# Patient Record
Sex: Male | Born: 1935 | Race: White | Hispanic: No | Marital: Married | Smoking: Former smoker
Health system: Southern US, Community
[De-identification: ages and names within clinical notes are randomized; demographics above are authoritative.]

## PROBLEM LIST (undated history)

## (undated) DIAGNOSIS — I10 Essential (primary) hypertension: Secondary | ICD-10-CM

## (undated) DIAGNOSIS — I2510 Atherosclerotic heart disease of native coronary artery without angina pectoris: Secondary | ICD-10-CM

## (undated) DIAGNOSIS — I509 Heart failure, unspecified: Secondary | ICD-10-CM

## (undated) DIAGNOSIS — I499 Cardiac arrhythmia, unspecified: Secondary | ICD-10-CM

## (undated) DIAGNOSIS — I4891 Unspecified atrial fibrillation: Secondary | ICD-10-CM

## (undated) DIAGNOSIS — M150 Primary generalized (osteo)arthritis: Secondary | ICD-10-CM

## (undated) DIAGNOSIS — E785 Hyperlipidemia, unspecified: Secondary | ICD-10-CM

## (undated) HISTORY — PX: CARDIAC SURGERY: SHX584

## (undated) HISTORY — DX: Hyperlipidemia, unspecified: E78.5

## (undated) HISTORY — DX: Atherosclerotic heart disease of native coronary artery without angina pectoris: I25.10

## (undated) HISTORY — DX: Cardiac arrhythmia, unspecified: I49.9

---

## 1996-08-17 HISTORY — PX: CORONARY ARTERY BYPASS GRAFT: SHX141

## 2020-03-02 ENCOUNTER — Encounter (HOSPITAL_COMMUNITY): Payer: Self-pay | Admitting: Emergency Medicine

## 2020-03-02 ENCOUNTER — Other Ambulatory Visit: Payer: Self-pay

## 2020-03-02 ENCOUNTER — Inpatient Hospital Stay (HOSPITAL_COMMUNITY)
Admission: EM | Admit: 2020-03-02 | Discharge: 2020-03-06 | DRG: 871 | Disposition: A | Discharge status: Home / Self Care | Payer: Medicare Other | Attending: Internal Medicine | Admitting: Internal Medicine

## 2020-03-02 ENCOUNTER — Emergency Department (HOSPITAL_COMMUNITY): Payer: Medicare Other

## 2020-03-02 DIAGNOSIS — Z888 Allergy status to other drugs, medicaments and biological substances status: Secondary | ICD-10-CM

## 2020-03-02 DIAGNOSIS — Z87891 Personal history of nicotine dependence: Secondary | ICD-10-CM | POA: Diagnosis not present

## 2020-03-02 DIAGNOSIS — I110 Hypertensive heart disease with heart failure: Secondary | ICD-10-CM | POA: Diagnosis present

## 2020-03-02 DIAGNOSIS — Z91030 Bee allergy status: Secondary | ICD-10-CM

## 2020-03-02 DIAGNOSIS — K3580 Unspecified acute appendicitis: Secondary | ICD-10-CM | POA: Diagnosis not present

## 2020-03-02 DIAGNOSIS — Z20822 Contact with and (suspected) exposure to covid-19: Secondary | ICD-10-CM | POA: Diagnosis present

## 2020-03-02 DIAGNOSIS — K352 Acute appendicitis with generalized peritonitis, without abscess: Secondary | ICD-10-CM

## 2020-03-02 DIAGNOSIS — E278 Other specified disorders of adrenal gland: Secondary | ICD-10-CM | POA: Diagnosis present

## 2020-03-02 DIAGNOSIS — Z79899 Other long term (current) drug therapy: Secondary | ICD-10-CM | POA: Diagnosis not present

## 2020-03-02 DIAGNOSIS — Z7901 Long term (current) use of anticoagulants: Secondary | ICD-10-CM | POA: Diagnosis not present

## 2020-03-02 DIAGNOSIS — I4891 Unspecified atrial fibrillation: Secondary | ICD-10-CM | POA: Diagnosis present

## 2020-03-02 DIAGNOSIS — I10 Essential (primary) hypertension: Secondary | ICD-10-CM | POA: Diagnosis not present

## 2020-03-02 DIAGNOSIS — I4510 Unspecified right bundle-branch block: Secondary | ICD-10-CM | POA: Diagnosis present

## 2020-03-02 DIAGNOSIS — K3532 Acute appendicitis with perforation and localized peritonitis, without abscess: Secondary | ICD-10-CM | POA: Diagnosis present

## 2020-03-02 DIAGNOSIS — E876 Hypokalemia: Secondary | ICD-10-CM | POA: Diagnosis present

## 2020-03-02 DIAGNOSIS — Z7982 Long term (current) use of aspirin: Secondary | ICD-10-CM

## 2020-03-02 DIAGNOSIS — I482 Chronic atrial fibrillation, unspecified: Secondary | ICD-10-CM | POA: Diagnosis present

## 2020-03-02 DIAGNOSIS — Z882 Allergy status to sulfonamides status: Secondary | ICD-10-CM | POA: Diagnosis not present

## 2020-03-02 DIAGNOSIS — E871 Hypo-osmolality and hyponatremia: Secondary | ICD-10-CM | POA: Diagnosis present

## 2020-03-02 DIAGNOSIS — Z7289 Other problems related to lifestyle: Secondary | ICD-10-CM | POA: Diagnosis not present

## 2020-03-02 DIAGNOSIS — A419 Sepsis, unspecified organism: Secondary | ICD-10-CM | POA: Diagnosis present

## 2020-03-02 DIAGNOSIS — I5032 Chronic diastolic (congestive) heart failure: Secondary | ICD-10-CM | POA: Diagnosis present

## 2020-03-02 HISTORY — DX: Heart failure, unspecified: I50.9

## 2020-03-02 HISTORY — DX: Essential (primary) hypertension: I10

## 2020-03-02 HISTORY — DX: Unspecified atrial fibrillation: I48.91

## 2020-03-02 LAB — COMPREHENSIVE METABOLIC PANEL
ALT: 19 U/L (ref 0–44)
AST: 18 U/L (ref 15–41)
Albumin: 4.4 g/dL (ref 3.5–5.0)
Alkaline Phosphatase: 42 U/L (ref 38–126)
Anion gap: 11 (ref 5–15)
BUN: 13 mg/dL (ref 8–23)
CO2: 27 mmol/L (ref 22–32)
Calcium: 9.1 mg/dL (ref 8.9–10.3)
Chloride: 94 mmol/L — ABNORMAL LOW (ref 98–111)
Creatinine, Ser: 0.76 mg/dL (ref 0.61–1.24)
GFR calc Af Amer: >60 mL/min (ref >60)
GFR calc non Af Amer: >60 mL/min (ref >60)
Glucose, Bld: 152 mg/dL — ABNORMAL HIGH (ref 70–99)
Potassium: 3.5 mmol/L (ref 3.5–5.1)
Sodium: 132 mmol/L — ABNORMAL LOW (ref 135–145)
Total Bilirubin: 1.7 mg/dL — ABNORMAL HIGH (ref 0.3–1.2)
Total Protein: 7.6 g/dL (ref 6.5–8.1)

## 2020-03-02 LAB — URINALYSIS, ROUTINE W REFLEX MICROSCOPIC
Bacteria, UA: NONE SEEN
Bilirubin Urine: NEGATIVE
Glucose, UA: NEGATIVE mg/dL
Hgb urine dipstick: NEGATIVE
Ketones, ur: 5 mg/dL — AB
Leukocytes,Ua: NEGATIVE
Nitrite: NEGATIVE
Protein, ur: 30 mg/dL — AB
Specific Gravity, Urine: 1.02 (ref 1.005–1.030)
pH: 5 (ref 5.0–8.0)

## 2020-03-02 LAB — CBC
HCT: 41.5 % (ref 39.0–52.0)
Hemoglobin: 14 g/dL (ref 13.0–17.0)
MCH: 32 pg (ref 26.0–34.0)
MCHC: 33.7 g/dL (ref 30.0–36.0)
MCV: 95 fL (ref 80.0–100.0)
Platelets: 183 10*3/uL (ref 150–400)
RBC: 4.37 MIL/uL (ref 4.22–5.81)
RDW: 11.9 % (ref 11.5–15.5)
WBC: 18.3 10*3/uL — ABNORMAL HIGH (ref 4.0–10.5)
nRBC: 0 % (ref 0.0–0.2)

## 2020-03-02 LAB — SARS CORONAVIRUS 2 BY RT PCR (HOSPITAL ORDER, PERFORMED IN ~~LOC~~ HOSPITAL LAB): SARS Coronavirus 2: NEGATIVE

## 2020-03-02 LAB — LACTIC ACID, PLASMA: Lactic Acid, Venous: 1.3 mmol/L (ref 0.5–1.9)

## 2020-03-02 LAB — LIPASE, BLOOD: Lipase: 20 U/L (ref 11–51)

## 2020-03-02 MED ORDER — SODIUM CHLORIDE 0.9 % IV BOLUS
1000.0000 mL | Freq: Once | INTRAVENOUS | Status: AC
  Administered 2020-03-02: 1000 mL via INTRAVENOUS

## 2020-03-02 MED ORDER — PIPERACILLIN-TAZOBACTAM 3.375 G IVPB
3.3750 g | Freq: Three times a day (TID) | INTRAVENOUS | Status: DC
  Administered 2020-03-03 – 2020-03-06 (×10): 3.375 g via INTRAVENOUS
  Filled 2020-03-02 (×10): qty 50

## 2020-03-02 MED ORDER — PIPERACILLIN-TAZOBACTAM 3.375 G IVPB 30 MIN
3.3750 g | Freq: Once | INTRAVENOUS | Status: AC
  Administered 2020-03-02: 3.375 g via INTRAVENOUS
  Filled 2020-03-02: qty 50

## 2020-03-02 MED ORDER — SODIUM CHLORIDE 0.9% FLUSH: 3.0000 mL | Freq: Once | INTRAVENOUS | Status: AC

## 2020-03-02 MED ORDER — SODIUM CHLORIDE (PF) 0.9 % IJ SOLN
INTRAMUSCULAR | Status: AC
  Administered 2020-03-02: 3 mL via INTRAVENOUS
  Filled 2020-03-02: qty 50

## 2020-03-02 MED ORDER — IOHEXOL 300 MG/ML  SOLN
100.0000 mL | Freq: Once | INTRAMUSCULAR | Status: AC | PRN
  Administered 2020-03-02: 100 mL via INTRAVENOUS

## 2020-03-02 NOTE — Progress Notes (Signed)
Pharmacy Antibiotic Note  Donald [name] is a 86 y.o. male admitted on 03/02/2020 with Intra-abdominal infection.  Pharmacy has been consulted for zosyn dosing.  Plan: Zosyn 3.375g IV q8h (4 hour infusion).  Height: 5\' 6"  (167.6 cm) Weight: 82.6 kg (182 lb) IBW/kg (Calculated) : 63.8  Temp (24hrs), Avg:99.3 F (37.4 C), Min:99.3 F (37.4 C), Max:99.3 F (37.4 C)  Recent Labs  Lab 03/02/20 1929 03/02/20 2102  WBC 18.3*  --   CREATININE 0.76  --   LATICACIDVEN  --  1.3    Estimated Creatinine Clearance: 68.1 mL/min (by C-G formula based on SCr of 0.76 mg/dL).    Allergies  Allergen Reactions  . Bee Venom Other (See Comments)    Other reaction(s): fainting, decreased BP  . Chromium Other (See Comments)    rash  . Gabapentin Other (See Comments)    Can't take capsules, tablets are tolerated  . Sulfa Antibiotics Rash    Antimicrobials this admission: Zosyn 03/02/2020 >>   Dose adjustments this admission: -  Microbiology results: -  Thank you for allowing pharmacy to be a part of this patient's care.  03/04/2020 [place] 03/02/2020 11:51 PM

## 2020-03-02 NOTE — Consult Note (Signed)
Reason for Consult:appendicitis Referring Physician: Dalene Seltzer MD   Sequoyah Counterman is an 86 y.o. male.  HPI: Asked to see pt for abdominal pain.  Started yesterday and was located around his belt line.  Crampy and focused around his umbilicus.  Pain worsened and he sought care in the ED.  CT showed perforation without abscess.  There is a phlegmon. Pt has low grade fever. He is comfortable currently.   Past Medical History:  Diagnosis Date   A-fib [facility])    CHF (congestive heart failure) (HCC)    Hypertension     Past Surgical History:  Procedure Laterality Date   CARDIAC SURGERY      No family history on file.  Social History:  reports that he has quit smoking. He has never used smokeless tobacco. He reports current alcohol use. He reports that he does not use drugs.  Allergies:  Allergies  Allergen Reactions   Bee Venom Other (See Comments)    Other reaction(s): fainting, decreased BP   Chromium Other (See Comments)    rash   Gabapentin Other (See Comments)    Can't take capsules, tablets are tolerated   Sulfa Antibiotics Rash    Medications: I have reviewed the patient's current medications.    CT ABDOMEN PELVIS W CONTRAST  Result Date: 03/02/2020 CLINICAL DATA:  Abdominal pain, fever EXAM: CT ABDOMEN AND PELVIS WITH CONTRAST TECHNIQUE: Multidetector CT imaging of the abdomen and pelvis was performed using the standard protocol following bolus administration of intravenous contrast. CONTRAST:  OMNIPAQUE IOHEXOL 300 MG/ML  SOLN COMPARISON:  None FINDINGS: Lower chest: Lung bases are clear. Cardiac size at the upper limits of normal. No pericardial effusion. Hepatobiliary: Hypoattenuating focus in the caudate lobe measuring 1.2 cm, some possible centripetal fill in on delayed phase imaging. Few additional scattered subcentimeter hypertension foci too small to fully characterize on CT imaging but statistically likely benign. Smooth liver surface contour. Hepatic  attenuation within expected normals for phase of imaging. Normal gallbladder and biliary tree without visible calcified gallstones. Pancreas: Unremarkable. No pancreatic ductal dilatation or surrounding inflammatory changes. Spleen: Normal in size without focal abnormality. Adrenals/Urinary Tract: There is a 2.2 cm fat attenuation left adrenal nodule arising from the medial limb (2/18) and a more indeterminate 2.6 cm intermediate attenuation lesion arising from the lateral limb/body without significant washout on postcontrast imaging (2/21). Kidneys enhance and excrete symmetrically. Fluid attenuation cyst measuring 1.7 cm in the interpolar left kidney. Additional subcentimeter hypertension foci too small to fully characterize on CT imaging but statistically likely benign. No worrisome renal lesions. No urolithiasis or hydronephrosis. Urinary bladder is largely decompressed at the time of exam and therefore poorly evaluated by CT imaging. Bladder wall thickening is nonspecific, possibly related to underdistention though could correlate for symptoms cystitis. Stomach/Bowel: Distal esophagus, stomach and duodenum are unremarkable. There are several focally thickened segments of small bowel in the right mid abdomen (5/57 for exam) which appears reactive to the adjacent phlegmon and inflammation centered upon the tip of the appendix with evidence of perforation given small punctate foci of extraluminal gas and free fluid. Much of the colon is otherwise unremarkable aside from a small amount of adjacent reactive thickening upon the mid sigmoid in a region of several colonic diverticula possibly causing a reactive diverticulitis. Vascular/Lymphatic: Atherosclerotic calcifications within the abdominal aorta and branch vessels. No aneurysm or ectasia. No enlarged abdominopelvic lymph nodes. Reactive adenopathy is present in the mesentery adjacent the appendiceal perforation. Reproductive: Borderline prostatomegaly. Few  coarse  eccentric calcifications. Seminal vesicles are unremarkable. Other: Perforated appendicitis with surrounding phlegmonous change and few foci of extraluminal gas and trace free fluid. Some associated peritoneal thickening. No other abdominopelvic free air or fluid. No bowel containing hernias. Small fat containing umbilical hernia. Musculoskeletal: No acute osseous abnormality or suspicious osseous lesion. Multilevel degenerative changes are present in the imaged portions of the spine. Stepwise retrolisthesis L1-L3 and grade 1 anterolisthesis L4 on 5. Advanced multilevel discogenic change and facet arthropathy resulting in multilevel moderate to severe foraminal and canal stenoses throughout the lumbar spine. Additional moderate degenerative changes in the hips and pelvis. IMPRESSION: 1. Appendicitis with likely small microperforation with surrounding phlegmonous change and few foci of extraluminal gas and trace free fluid. No organized collection is seen at this time. 2. Several focally thickened segments of adjacent small bowel and the mid sigmoid including several minimally colonic diverticula favored to be reactive/secondary to the appendicitis. 3. Bladder wall thickening is nonspecific, possibly related to underdistention though could correlate for symptoms of cystitis. 4. Left adrenal myelolipoma. Additional indeterminate 2.6 cm intermediate attenuation lesion arising from the left adrenal gland. Could consider further evaluation with nonemergent adrenal protocol CT or MRI. This recommendation follows ACR consensus guidelines: Management of Incidental Adrenal Masses: A White Paper of the ACR Incidental Findings Committee. J Am Coll Radiol 2017;14:1038-1044. 5. Aortic Atherosclerosis (ICD10-I70.0). Electronically Signed   By: Kreg Shropshire M.D.   On: 03/02/2020 21:59    Review of Systems  Gastrointestinal: Positive for abdominal pain.  All other systems reviewed and are negative.  Blood pressure (!)  118/53, pulse 68, temperature 99.3 F (37.4 C), temperature source Oral, resp. rate 13, height 5\' 6"  (1.676 m), weight 82.6 kg, SpO2 95 %. Physical Exam Constitutional:      Appearance: He is well-developed.  HENT:     Head: Normocephalic and atraumatic.  Cardiovascular:     Rate and Rhythm: Rhythm irregular.     Heart sounds: No murmur heard.   Pulmonary:     Effort: Pulmonary effort is normal.     Breath sounds: Normal breath sounds. No stridor.  Abdominal:     Palpations: Abdomen is soft.     Tenderness: There is abdominal tenderness in the right lower quadrant and periumbilical area. Negative signs include McBurney's sign.     Hernia: No hernia is present.  Skin:    General: Skin is warm and dry.  Neurological:     General: No focal deficit present.     Mental Status: He is alert.  Psychiatric:        Mood and Affect: Mood normal.        Behavior: Behavior normal.     Assessment/Plan: Acute appendicitis with perforation- recommend medical management since appendix perforated.  He is comfortable and has stable vitals.  Multiple medical problem and on anticoagulation.  Clears liquids ok Continue zosyn q8 WBC in am  Given his comorbidities and perforation, medical management appropriate first step since morbidity and mortality higher giver perforation and cardiac issues/ blood thinners.    Ruth A Surah Pelley 03/02/2020, 11:05 PM

## 2020-03-02 NOTE — ED Provider Notes (Signed)
[facility]-EMERGENCY DEPT Provider Note   CSN: [id number] Arrival date & time: 03/02/20  7741     History Chief Complaint  Patient presents with  . Abdominal Pain    Donald [name] is a 86 y.o. male.  HPI      Very pleasant 86yo male with history of CHF, atrial fibrillation on eliquis, htn presents with concern for abdominal pain and fever.  Reports pain started yesterday around his belt line after he had been sitting for some time, initially thought it was due to his belt. Today he was walking around and felt worsening of pain. Lower abdomen extending across to right and left sides. Low appeite. Temperature 100.6 at home. [place] tylenol 4PM. No vomiting, diarrhea, urinary symptoms.   Past Medical History:  Diagnosis Date  . A-fib (HCC)   . CHF (congestive heart failure) (HCC)   . Hypertension     Patient Active Problem List   Diagnosis Date Noted  . Adrenal nodule (HCC) 03/03/2020  . Acute appendicitis 03/02/2020  . A-fib (HCC)   . Hypertension   . Chronic diastolic CHF (congestive heart failure) (HCC)   . Hyponatremia     Past Surgical History:  Procedure Laterality Date  . CARDIAC SURGERY         History reviewed. No pertinent family history.  Social History   Tobacco Use  . Smoking status: Former Games developer  . Smokeless tobacco: Never Used  Substance Use Topics  . Alcohol use: Yes  . Drug use: Never    Home Medications Prior to Admission medications   Medication Sig Start Date End Date Taking? Authorizing Provider  amLODipine (NORVASC) 10 MG tablet Take 10 mg by mouth daily.   Yes [provider]  apixaban (ELIQUIS) 5 MG TABS tablet Take 5 mg by mouth 2 (two) times daily.   Yes [provider]  Ascorbic Acid (VITAMIN C) 500 MG CAPS Take 500 capsules by mouth daily.   Yes [provider]  aspirin EC 81 MG tablet Take 81 mg by mouth daily.   Yes [provider]  atorvastatin (LIPITOR) 40 MG tablet Take  40 mg by mouth daily.   Yes [provider]  cholecalciferol (VITAMIN D3) 25 MCG (1000 UNIT) tablet Take 1,000 Units by mouth daily.   Yes [provider]  furosemide (LASIX) 40 MG tablet Take 40 mg by mouth See admin instructions. Takes 1 tablet in the morning 1/2 tablet at 2 pm then another 1/2 tablet at 6 pm   Yes [provider]  gabapentin (NEURONTIN) 300 MG capsule Take 300 mg by mouth 3 (three) times daily.   Yes [provider]  losartan (COZAAR) 100 MG tablet Take 100 mg by mouth daily.   Yes [provider]  metoprolol tartrate (LOPRESSOR) 100 MG tablet Take 100 mg by mouth daily.    Yes [provider]    Allergies    Bee venom, Chromium, Gabapentin, and Sulfa antibiotics  Review of Systems   Review of Systems  Constitutional: Positive for appetite change, fatigue and fever.  HENT: Negative for congestion.   Eyes: Negative for visual disturbance.  Respiratory: Negative for cough and shortness of breath.   Cardiovascular: Negative for chest pain.  Gastrointestinal: Positive for abdominal pain. Negative for constipation, diarrhea, nausea and vomiting.  Genitourinary: Negative for dysuria.  Musculoskeletal: Negative for back pain.  Skin: Negative for rash.  Neurological: Negative for headaches.    Physical Exam Updated Vital Signs BP Marland Kitchen)  110/54 (BP Location: Right Arm)   Pulse 77   Temp 98.1 F (36.7 C) (Oral)   Resp 20   Ht 5\' 6"  (1.676 m)   Wt 82.7 kg   SpO2 96%   BMI 29.43 kg/m   Physical Exam Vitals and nursing note reviewed.  Constitutional:      General: He is not in acute distress.    Appearance: He is well-developed. He is not diaphoretic.  HENT:     Head: Normocephalic and atraumatic.  Eyes:     Conjunctiva/sclera: Conjunctivae normal.  Cardiovascular:     Rate and Rhythm: Normal rate. Rhythm irregular.  Pulmonary:     Effort: Pulmonary effort is normal. No respiratory distress.     Breath  sounds: Normal breath sounds. No wheezing or rales.  Abdominal:     General: There is no distension.     Palpations: Abdomen is soft.     Tenderness: There is generalized abdominal tenderness and tenderness in the right lower quadrant, suprapubic area and left lower quadrant. There is guarding. Positive signs include McBurney's sign.  Musculoskeletal:     Cervical back: Normal range of motion.  Skin:    General: Skin is warm and dry.  Neurological:     Mental Status: He is alert and oriented to person, place, and time.     ED Results / Procedures / Treatments   Labs (all labs ordered are listed, but only abnormal results are displayed) Labs Reviewed  COMPREHENSIVE METABOLIC PANEL - Abnormal; Notable for the following components:      Result Value   Sodium 132 (*)    Chloride 94 (*)    Glucose, Bld 152 (*)    Total Bilirubin 1.7 (*)    All other components within normal limits  CBC - Abnormal; Notable for the following components:   WBC 18.3 (*)    All other components within normal limits  URINALYSIS, ROUTINE W REFLEX MICROSCOPIC - Abnormal; Notable for the following components:   Color, Urine AMBER (*)    Ketones, ur 5 (*)    Protein, ur 30 (*)    All other components within normal limits  BASIC METABOLIC PANEL - Abnormal; Notable for the following components:   Potassium 3.2 (*)    Glucose, Bld 109 (*)    Creatinine, Ser 0.55 (*)    Calcium 8.7 (*)    All other components within normal limits  HEPATIC FUNCTION PANEL - Abnormal; Notable for the following components:   AST 14 (*)    Alkaline Phosphatase 35 (*)    Total Bilirubin 2.1 (*)    Bilirubin, Direct 0.3 (*)    Indirect Bilirubin 1.8 (*)    All other components within normal limits  MAGNESIUM - Abnormal; Notable for the following components:   Magnesium 1.6 (*)    All other components within normal limits  CBC WITH DIFFERENTIAL/PLATELET - Abnormal; Notable for the following components:   WBC 16.4 (*)    RBC  4.13 (*)    Platelets 142 (*)    Neutro Abs 13.8 (*)    Abs Immature Granulocytes 0.23 (*)    All other components within normal limits  CULTURE, BLOOD (ROUTINE X 2)  SARS CORONAVIRUS 2 BY RT PCR (HOSPITAL ORDER, PERFORMED IN [facility])  CULTURE, BLOOD (ROUTINE X 2)  URINE CULTURE  LIPASE, BLOOD  LACTIC ACID, PLASMA  HEPARIN LEVEL (UNFRACTIONATED)  APTT    EKG EKG Interpretation  Date/Time:  Saturday March 02 2020  02:58:52 EDT Ventricular Rate:  85 PR Interval:    QRS Duration: 138 QT Interval:  396 QTC Calculation: 471 R Axis:   69 Text Interpretation: Atrial fibrillation Right bundle branch block No previous ECGs available Confirmed by Alvira Monday ([id number]) on 03/02/2020 8:29:01 PM   Radiology CT ABDOMEN PELVIS W CONTRAST  Result Date: 03/02/2020 CLINICAL DATA:  Abdominal pain, fever EXAM: CT ABDOMEN AND PELVIS WITH CONTRAST TECHNIQUE: Multidetector CT imaging of the abdomen and pelvis was performed using the standard protocol following bolus administration of intravenous contrast. CONTRAST:  OMNIPAQUE IOHEXOL 300 MG/ML  SOLN COMPARISON:  None FINDINGS: Lower chest: Lung bases are clear. Cardiac size at the upper limits of normal. No pericardial effusion. Hepatobiliary: Hypoattenuating focus in the caudate lobe measuring 1.2 cm, some possible centripetal fill in on delayed phase imaging. Few additional scattered subcentimeter hypertension foci too small to fully characterize on CT imaging but statistically likely benign. Smooth liver surface contour. Hepatic attenuation within expected normals for phase of imaging. Normal gallbladder and biliary tree without visible calcified gallstones. Pancreas: Unremarkable. No pancreatic ductal dilatation or surrounding inflammatory changes. Spleen: Normal in size without focal abnormality. Adrenals/Urinary Tract: There is a 2.2 cm fat attenuation left adrenal nodule arising from the medial limb (2/18) and a more  indeterminate 2.6 cm intermediate attenuation lesion arising from the lateral limb/body without significant washout on postcontrast imaging (2/21). Kidneys enhance and excrete symmetrically. Fluid attenuation cyst measuring 1.7 cm in the interpolar left kidney. Additional subcentimeter hypertension foci too small to fully characterize on CT imaging but statistically likely benign. No worrisome renal lesions. No urolithiasis or hydronephrosis. Urinary bladder is largely decompressed at the time of exam and therefore poorly evaluated by CT imaging. Bladder wall thickening is nonspecific, possibly related to underdistention though could correlate for symptoms cystitis. Stomach/Bowel: Distal esophagus, stomach and duodenum are unremarkable. There are several focally thickened segments of small bowel in the right mid abdomen (5/57 for exam) which appears reactive to the adjacent phlegmon and inflammation centered upon the tip of the appendix with evidence of perforation given small punctate foci of extraluminal gas and free fluid. Much of the colon is otherwise unremarkable aside from a small amount of adjacent reactive thickening upon the mid sigmoid in a region of several colonic diverticula possibly causing a reactive diverticulitis. Vascular/Lymphatic: Atherosclerotic calcifications within the abdominal aorta and branch vessels. No aneurysm or ectasia. No enlarged abdominopelvic lymph nodes. Reactive adenopathy is present in the mesentery adjacent the appendiceal perforation. Reproductive: Borderline prostatomegaly. Few coarse eccentric calcifications. Seminal vesicles are unremarkable. Other: Perforated appendicitis with surrounding phlegmonous change and few foci of extraluminal gas and trace free fluid. Some associated peritoneal thickening. No other abdominopelvic free air or fluid. No bowel containing hernias. Small fat containing umbilical hernia. Musculoskeletal: No acute osseous abnormality or suspicious  osseous lesion. Multilevel degenerative changes are present in the imaged portions of the spine. Stepwise retrolisthesis L1-L3 and grade 1 anterolisthesis L4 on 5. Advanced multilevel discogenic change and facet arthropathy resulting in multilevel moderate to severe foraminal and canal stenoses throughout the lumbar spine. Additional moderate degenerative changes in the hips and pelvis. IMPRESSION: 1. Appendicitis with likely small microperforation with surrounding phlegmonous change and few foci of extraluminal gas and trace free fluid. No organized collection is seen at this time. 2. Several focally thickened segments of adjacent small bowel and the mid sigmoid including several minimally colonic diverticula favored to be reactive/secondary to the appendicitis. 3. Bladder wall thickening is nonspecific, possibly related  to underdistention though could correlate for symptoms of cystitis. 4. Left adrenal myelolipoma. Additional indeterminate 2.6 cm intermediate attenuation lesion arising from the left adrenal gland. Could consider further evaluation with nonemergent adrenal protocol CT or MRI. This recommendation follows ACR consensus guidelines: Management of Incidental Adrenal Masses: A White Paper of the ACR Incidental Findings Committee. J Am Coll Radiol 2017;14:1038-1044. 5. Aortic Atherosclerosis (ICD10-I70.0). Electronically Signed   By: Kreg Shropshire M.D.   On: 03/02/2020 21:59    Procedures .Critical Care Performed by: Alvira Monday, MD Authorized by: Alvira Monday, MD   Critical care provider statement:    Critical care time (minutes):  45   Critical care was time spent personally by me on the following activities:  Discussions with consultants, evaluation of patient's response to treatment, examination of patient, ordering and performing treatments and interventions, ordering and review of laboratory studies, ordering and review of radiographic studies, pulse oximetry, re-evaluation of  patient's condition, obtaining history from patient or surrogate and review of old charts   (including critical care time)  Medications Ordered in ED Medications  piperacillin-tazobactam (ZOSYN) IVPB 3.375 g ( Intravenous Rate/Dose Verify 03/03/20 0600)  acetaminophen (TYLENOL) tablet 650 mg (650 mg Oral Given 03/03/20 0658)  fentaNYL (SUBLIMAZE) injection 12.5-25 mcg (has no administration in time range)  0.9 %  sodium chloride infusion ( Intravenous Rate/Dose Verify 03/03/20 0600)  sodium chloride flush (NS) 0.9 % injection 3 mL (3 mLs Intravenous Given 03/03/20 0113)  ondansetron (ZOFRAN) tablet 4 mg (has no administration in time range)    Or  ondansetron (ZOFRAN) injection 4 mg (has no administration in time range)  gabapentin (NEURONTIN) capsule 300 mg (300 mg Oral Given 03/03/20 0944)  heparin ADULT infusion 100 units/mL (25000 units/243mL sodium chloride 0.45%) (1,000 Units/hr Intravenous Rate/Dose Verify 03/03/20 0600)  sodium chloride flush (NS) 0.9 % injection 3 mL (3 mLs Intravenous Given by Other 03/02/20 2154)  sodium chloride 0.9 % bolus 1,000 mL (0 mLs Intravenous Stopped 03/02/20 2216)  piperacillin-tazobactam (ZOSYN) IVPB 3.375 g ( Intravenous Stopped 03/02/20 2124)  iohexol (OMNIPAQUE) 300 MG/ML solution 100 mL (100 mLs Intravenous Contrast Given 03/02/20 2145)    ED Course  I have reviewed the triage vital signs and the nursing notes.  Pertinent labs & imaging results that were available during my care of the patient were reviewed by me and considered in my medical decision making (see chart for details).    MDM Rules/Calculators/A&P                          Very pleasant 86yo male with history of CHF, atrial fibrillation on eliquis, htn presents with concern for abdominal pain and fever.  CT shows perforated appendicits. Concern for sepsis-given IV fluids, zosyn empirically on arrival. BP improved with 1L, given CHF hx will not aggressively fluid resuscitate at this time  given BP in 90s on arrival may also be due to dehydration in setting of decreased po intake and medications.   Discussed with Dr. Luisa Hart General Surgery. Will admit to hopsitalist Dr. Antionette Char for further care.    Final Clinical Impression(s) / ED Diagnoses Final diagnoses:  Acute appendicitis with perforation and generalized peritonitis, without abscess, unspecified whether gangrene present  Sepsis without acute organ dysfunction, due to unspecified organism [facility])    Rx / DC Orders ED Discharge Orders    None       Alvira Monday, MD 03/03/20 1105

## 2020-03-02 NOTE — ED Triage Notes (Signed)
Pt reports having abdominal pain that started yesterday with pain in upper abdomen. Pt also indorses fever of 100.6 today and tylenol taken at 1600. Denies any nausea, vomiting, or diarrhea.

## 2020-03-02 NOTE — ED Notes (Signed)
General surgery at bedside. 

## 2020-03-03 ENCOUNTER — Encounter (HOSPITAL_COMMUNITY): Payer: Self-pay | Admitting: Family Medicine

## 2020-03-03 DIAGNOSIS — E871 Hypo-osmolality and hyponatremia: Secondary | ICD-10-CM

## 2020-03-03 DIAGNOSIS — I482 Chronic atrial fibrillation, unspecified: Secondary | ICD-10-CM

## 2020-03-03 DIAGNOSIS — E278 Other specified disorders of adrenal gland: Secondary | ICD-10-CM | POA: Diagnosis present

## 2020-03-03 LAB — APTT
aPTT: 52 seconds — ABNORMAL HIGH (ref 24–36)
aPTT: 120 seconds — ABNORMAL HIGH (ref 24–36)

## 2020-03-03 LAB — HEPATIC FUNCTION PANEL
ALT: 15 U/L (ref 0–44)
AST: 14 U/L — ABNORMAL LOW (ref 15–41)
Albumin: 3.8 g/dL (ref 3.5–5.0)
Alkaline Phosphatase: 35 U/L — ABNORMAL LOW (ref 38–126)
Bilirubin, Direct: 0.3 mg/dL — ABNORMAL HIGH (ref 0.0–0.2)
Indirect Bilirubin: 1.8 mg/dL — ABNORMAL HIGH (ref 0.3–0.9)
Total Bilirubin: 2.1 mg/dL — ABNORMAL HIGH (ref 0.3–1.2)
Total Protein: 6.6 g/dL (ref 6.5–8.1)

## 2020-03-03 LAB — CBC WITH DIFFERENTIAL/PLATELET
Abs Immature Granulocytes: 0.23 10*3/uL — ABNORMAL HIGH (ref 0.00–0.07)
Basophils Absolute: 0 10*3/uL (ref 0.0–0.1)
Basophils Relative: 0 %
Eosinophils Absolute: 0 10*3/uL (ref 0.0–0.5)
Eosinophils Relative: 0 %
HCT: 39 % (ref 39.0–52.0)
Hemoglobin: 13.2 g/dL (ref 13.0–17.0)
Immature Granulocytes: 1 %
Lymphocytes Relative: 11 %
Lymphs Abs: 1.7 10*3/uL (ref 0.7–4.0)
MCH: 32 pg (ref 26.0–34.0)
MCHC: 33.8 g/dL (ref 30.0–36.0)
MCV: 94.4 fL (ref 80.0–100.0)
Monocytes Absolute: 0.5 10*3/uL (ref 0.1–1.0)
Monocytes Relative: 3 %
Neutro Abs: 13.8 10*3/uL — ABNORMAL HIGH (ref 1.7–7.7)
Neutrophils Relative %: 85 %
Platelets: 142 10*3/uL — ABNORMAL LOW (ref 150–400)
RBC: 4.13 MIL/uL — ABNORMAL LOW (ref 4.22–5.81)
RDW: 12.3 % (ref 11.5–15.5)
WBC: 16.4 10*3/uL — ABNORMAL HIGH (ref 4.0–10.5)
nRBC: 0 % (ref 0.0–0.2)

## 2020-03-03 LAB — BASIC METABOLIC PANEL
Anion gap: 12 (ref 5–15)
BUN: 13 mg/dL (ref 8–23)
CO2: 26 mmol/L (ref 22–32)
Calcium: 8.7 mg/dL — ABNORMAL LOW (ref 8.9–10.3)
Chloride: 98 mmol/L (ref 98–111)
Creatinine, Ser: 0.55 mg/dL — ABNORMAL LOW (ref 0.61–1.24)
GFR calc Af Amer: >60 mL/min (ref >60)
GFR calc non Af Amer: >60 mL/min (ref >60)
Glucose, Bld: 109 mg/dL — ABNORMAL HIGH (ref 70–99)
Potassium: 3.2 mmol/L — ABNORMAL LOW (ref 3.5–5.1)
Sodium: 136 mmol/L (ref 135–145)

## 2020-03-03 LAB — HEPARIN LEVEL (UNFRACTIONATED): Heparin Unfractionated: 1.36 IU/mL — ABNORMAL HIGH (ref 0.30–0.70)

## 2020-03-03 LAB — MAGNESIUM: Magnesium: 1.6 mg/dL — ABNORMAL LOW (ref 1.7–2.4)

## 2020-03-03 MED ORDER — SODIUM CHLORIDE 0.9 % IV SOLN: INTRAVENOUS | Status: AC

## 2020-03-03 MED ORDER — GABAPENTIN 600 MG PO TABS: 300.0000 mg | ORAL_TABLET | Freq: Three times a day (TID) | ORAL | Status: DC

## 2020-03-03 MED ORDER — FENTANYL CITRATE (PF) 100 MCG/2ML IJ SOLN: 12.5000 ug | INTRAMUSCULAR | Status: DC | PRN

## 2020-03-03 MED ORDER — ACETAMINOPHEN 325 MG PO TABS
650.0000 mg | ORAL_TABLET | Freq: Four times a day (QID) | ORAL | Status: DC | PRN
  Administered 2020-03-03 – 2020-03-05 (×6): 650 mg via ORAL
  Filled 2020-03-03 (×6): qty 2

## 2020-03-03 MED ORDER — HEPARIN (PORCINE) 25000 UT/250ML-% IV SOLN
1000.0000 [IU]/h | INTRAVENOUS | Status: DC
  Administered 2020-03-03: 1000 [IU]/h via INTRAVENOUS
  Filled 2020-03-03: qty 250

## 2020-03-03 MED ORDER — ONDANSETRON HCL 4 MG PO TABS: 4.0000 mg | ORAL_TABLET | Freq: Four times a day (QID) | ORAL | Status: DC | PRN

## 2020-03-03 MED ORDER — GABAPENTIN 300 MG PO CAPS
300.0000 mg | ORAL_CAPSULE | Freq: Three times a day (TID) | ORAL | Status: DC
  Administered 2020-03-03 – 2020-03-06 (×11): 300 mg via ORAL
  Filled 2020-03-03 (×11): qty 1

## 2020-03-03 MED ORDER — SODIUM CHLORIDE 0.9% FLUSH
3.0000 mL | Freq: Two times a day (BID) | INTRAVENOUS | Status: DC
  Administered 2020-03-03 – 2020-03-05 (×6): 3 mL via INTRAVENOUS

## 2020-03-03 MED ORDER — HEPARIN (PORCINE) 25000 UT/250ML-% IV SOLN
1150.0000 [IU]/h | INTRAVENOUS | Status: DC
  Administered 2020-03-04: 1050 [IU]/h via INTRAVENOUS
  Filled 2020-03-03 (×2): qty 250

## 2020-03-03 MED ORDER — ONDANSETRON HCL 4 MG/2ML IJ SOLN
4.0000 mg | Freq: Four times a day (QID) | INTRAMUSCULAR | Status: DC | PRN
  Filled 2020-03-03: qty 2

## 2020-03-03 NOTE — Progress Notes (Signed)
Assessment & Plan: HD#2 - perforated acute appendicitis on anticoagulation  On IV Zosyn - WBC down slightly to 16.4  Allow full liquid diet - BM this AM  Follow labs and exam  Likely repeat CT abd in 2-3 days  Plan non-operative management for now  Hold Eliquis - on heparin gtts  Will follow with you.        Darnell Level, MD       [facility]       Office: [phone]   Chief Complaint: Perforated acute appendicitis  Subjective: Patient in bed, sleeping comfortably, arouses easily.  Pleasant.  Mild pain controlled with Tylenol.  Objective: Vital signs in last 24 hours: Temp:  [98.4 F (36.9 C)-100.2 F (37.9 C)] 98.4 F (36.9 C) (07/18 0535) Pulse Rate:  [65-89] 65 (07/18 0535) Resp:  [12-20] 16 (07/18 0535) BP: (98-153)/(52-75) 127/71 (07/18 0535) SpO2:  [93 %-99 %] 94 % (07/18 0535) Weight:  [82.6 kg-82.7 kg] 82.7 kg (07/18 0500) Last BM Date: 03/02/20  Intake/Output from previous day: 07/17 0701 - 07/18 0700 In: 611.5 [P.O.:180; I.V.:382.7; IV Piggyback:48.8] Out: -  Intake/Output this shift: No intake/output data recorded.  Physical Exam: HEENT - sclerae clear, mucous membranes moist Neck - soft Chest - clear bilaterally Abdomen - soft without distension; BS present; mild tenderness RLQ without mass Ext - no edema, non-tender Neuro - alert & oriented, no focal deficits  Lab Results:  Recent Labs    03/02/20 1929 03/03/20 0701  WBC 18.3* 16.4*  HGB 14.0 13.2  HCT 41.5 39.0  PLT 183 142*   BMET Recent Labs    03/02/20 1929  NA 132*  K 3.5  CL 94*  CO2 27  GLUCOSE 152*  BUN 13  CREATININE 0.76  CALCIUM 9.1   PT/INR No results for input(s): LABPROT, INR in the last 72 hours. Comprehensive Metabolic Panel:    Component Value Date/Time   NA 132 (L) 03/02/2020 1929   K 3.5 03/02/2020 1929   CL 94 (L) 03/02/2020 1929   CO2 27 03/02/2020 1929   BUN 13 03/02/2020 1929   CREATININE 0.76 03/02/2020 1929   GLUCOSE  152 (H) 03/02/2020 1929   CALCIUM 9.1 03/02/2020 1929   AST 18 03/02/2020 1929   ALT 19 03/02/2020 1929   ALKPHOS 42 03/02/2020 1929   BILITOT 1.7 (H) 03/02/2020 1929   PROT 7.6 03/02/2020 1929   ALBUMIN 4.4 03/02/2020 1929    Studies/Results: CT ABDOMEN PELVIS W CONTRAST  Result Date: 03/02/2020 CLINICAL DATA:  Abdominal pain, fever EXAM: CT ABDOMEN AND PELVIS WITH CONTRAST TECHNIQUE: Multidetector CT imaging of the abdomen and pelvis was performed using the standard protocol following bolus administration of intravenous contrast. CONTRAST:  OMNIPAQUE IOHEXOL 300 MG/ML  SOLN COMPARISON:  None FINDINGS: Lower chest: Lung bases are clear. Cardiac size at the upper limits of normal. No pericardial effusion. Hepatobiliary: Hypoattenuating focus in the caudate lobe measuring 1.2 cm, some possible centripetal fill in on delayed phase imaging. Few additional scattered subcentimeter hypertension foci too small to fully characterize on CT imaging but statistically likely benign. Smooth liver surface contour. Hepatic attenuation within expected normals for phase of imaging. Normal gallbladder and biliary tree without visible calcified gallstones. Pancreas: Unremarkable. No pancreatic ductal dilatation or surrounding inflammatory changes. Spleen: Normal in size without focal abnormality. Adrenals/Urinary Tract: There is a 2.2 cm fat attenuation left adrenal nodule arising from the medial limb (2/18) and a more indeterminate 2.6 cm intermediate attenuation  lesion arising from the lateral limb/body without significant washout on postcontrast imaging (2/21). Kidneys enhance and excrete symmetrically. Fluid attenuation cyst measuring 1.7 cm in the interpolar left kidney. Additional subcentimeter hypertension foci too small to fully characterize on CT imaging but statistically likely benign. No worrisome renal lesions. No urolithiasis or hydronephrosis. Urinary bladder is largely decompressed at the time of exam  and therefore poorly evaluated by CT imaging. Bladder wall thickening is nonspecific, possibly related to underdistention though could correlate for symptoms cystitis. Stomach/Bowel: Distal esophagus, stomach and duodenum are unremarkable. There are several focally thickened segments of small bowel in the right mid abdomen (5/57 for exam) which appears reactive to the adjacent phlegmon and inflammation centered upon the tip of the appendix with evidence of perforation given small punctate foci of extraluminal gas and free fluid. Much of the colon is otherwise unremarkable aside from a small amount of adjacent reactive thickening upon the mid sigmoid in a region of several colonic diverticula possibly causing a reactive diverticulitis. Vascular/Lymphatic: Atherosclerotic calcifications within the abdominal aorta and branch vessels. No aneurysm or ectasia. No enlarged abdominopelvic lymph nodes. Reactive adenopathy is present in the mesentery adjacent the appendiceal perforation. Reproductive: Borderline prostatomegaly. Few coarse eccentric calcifications. Seminal vesicles are unremarkable. Other: Perforated appendicitis with surrounding phlegmonous change and few foci of extraluminal gas and trace free fluid. Some associated peritoneal thickening. No other abdominopelvic free air or fluid. No bowel containing hernias. Small fat containing umbilical hernia. Musculoskeletal: No acute osseous abnormality or suspicious osseous lesion. Multilevel degenerative changes are present in the imaged portions of the spine. Stepwise retrolisthesis L1-L3 and grade 1 anterolisthesis L4 on 5. Advanced multilevel discogenic change and facet arthropathy resulting in multilevel moderate to severe foraminal and canal stenoses throughout the lumbar spine. Additional moderate degenerative changes in the hips and pelvis. IMPRESSION: 1. Appendicitis with likely small microperforation with surrounding phlegmonous change and few foci of  extraluminal gas and trace free fluid. No organized collection is seen at this time. 2. Several focally thickened segments of adjacent small bowel and the mid sigmoid including several minimally colonic diverticula favored to be reactive/secondary to the appendicitis. 3. Bladder wall thickening is nonspecific, possibly related to underdistention though could correlate for symptoms of cystitis. 4. Left adrenal myelolipoma. Additional indeterminate 2.6 cm intermediate attenuation lesion arising from the left adrenal gland. Could consider further evaluation with nonemergent adrenal protocol CT or MRI. This recommendation follows ACR consensus guidelines: Management of Incidental Adrenal Masses: A White Paper of the ACR Incidental Findings Committee. J Am Coll Radiol 2017;14:1038-1044. 5. Aortic Atherosclerosis (ICD10-I70.0). Electronically Signed   By: Kreg Shropshire M.D.   On: 03/02/2020 21:59      Darnell Level 03/03/2020  Patient ID: Donald [name], male   DOB: 12/11/1932, 86 y.o.   MRN: [id number]

## 2020-03-03 NOTE — H&P (Signed)
History and Physical    [name] HYI:[id number] DOB: [birth year] DOA: 03/02/2020  PCP: [facility]   Patient coming from: Home   Chief Complaint: Abdominal pain, fever   HPI: Donald [name] is a 86 y.o. male with medical history significant for atrial fibrillation on Eliquis, hypertension, and chronic diastolic CHF, now presenting to emergency department for evaluation of abdominal pain and fever.  Patient reports he been in his usual state of health until 03/01/2020 when he developed mid abdominal pain and fever.  He was unable to identify any precipitating event for his symptoms but describes moderate to severe pain localized to the mid abdomen and temperature of 100.6 F at home.  He took Tylenol at home with improvement in his symptoms, but the pain began to worsen again, has been persistent, and eventually prompted his presentation to the ED.  Patient denies any associated vomiting, diarrhea, melena, or hematochezia.  He had been doing well leading up to this and denies any recent chest pain, cough, or shortness of breath.  ED Course: Upon arrival to the ED, patient is found to be afebrile, saturating well on room air, and with blood pressure 98/53.  EKG features atrial fibrillation with RBBB.  Chemistry panel with mild hyponatremia and CBC with leukocytosis to 18,300.  Lactic acid is reassuringly normal.  COVID-19 screening test was negative.  CT of the abdomen and pelvis is concerning for acute appendicitis with suspected microperforation, and also notable for an incidental left adrenal lesion.  Blood cultures were collected in the emergency department, a liter of saline was administered, and the patient was started on Zosyn.  Surgery was consulted by the ED physician, has evaluated patient in the emergency department, and recommends medical admission.  Review of Systems:  All other systems reviewed and apart from HPI, are negative.  Past Medical History:  Diagnosis Date  .  A-fib (HCC)   . CHF (congestive heart failure) (HCC)   . Hypertension     Past Surgical History:  Procedure Laterality Date  . CARDIAC SURGERY      Social History:   reports that he has quit smoking. He has never used smokeless tobacco. He reports current alcohol use. He reports that he does not use drugs.  Allergies  Allergen Reactions  . Bee Venom Other (See Comments)    Other reaction(s): fainting, decreased BP  . Chromium Other (See Comments)    rash  . Gabapentin Other (See Comments)    Can't take capsules, tablets are tolerated  . Sulfa Antibiotics Rash    History reviewed. No pertinent family history.   Prior to Admission medications   Medication Sig Start Date End Date Taking? Authorizing Provider  amLODipine (NORVASC) 10 MG tablet Take 10 mg by mouth daily.   Yes [provider]  apixaban (ELIQUIS) 5 MG TABS tablet Take 5 mg by mouth 2 (two) times daily.   Yes [provider]  Ascorbic Acid (VITAMIN C) 500 MG CAPS Take 500 capsules by mouth daily.   Yes [provider]  aspirin EC 81 MG tablet Take 81 mg by mouth daily.   Yes [provider]  atorvastatin (LIPITOR) 40 MG tablet Take 40 mg by mouth daily.   Yes [provider]  cholecalciferol (VITAMIN D3) 25 MCG (1000 UNIT) tablet Take 1,000 Units by mouth daily.   Yes [provider]  furosemide (LASIX) 40 MG tablet Take 40 mg by mouth See admin instructions. Takes 1 tablet in the morning  1/2 tablet at 2 pm then another 1/2 tablet at 6 pm   Yes [provider]  gabapentin (NEURONTIN) 300 MG capsule Take 300 mg by mouth 3 (three) times daily.   Yes [provider]  losartan (COZAAR) 100 MG tablet Take 100 mg by mouth daily.   Yes [provider]  metoprolol tartrate (LOPRESSOR) 100 MG tablet Take 100 mg by mouth daily.    Yes [provider]    Physical Exam: Vitals:   03/02/20 2144 03/02/20 2230 03/02/20 2330 03/03/20 0000    BP: (!) 130/52 (!) 118/53 138/61 122/60  Pulse: 81 68 79 69  Resp: 18 13 12 16   Temp:      TempSrc:      SpO2: 99% 95% 97% 96%  Weight:      Height:        Constitutional: NAD, calm  Eyes: PERTLA, lids and conjunctivae normal ENMT: Mucous membranes are moist. Posterior pharynx clear of any exudate or lesions.   Neck: normal, supple, no masses, no thyromegaly Respiratory:  no wheezing, no crackles. No accessory muscle use.  Cardiovascular: S1 & S2 heard, regular rate and rhythm. No extremity edema.  Abdomen: soft, tender, no rebound pain or guarding. Bowel sounds active.  Musculoskeletal: no clubbing / cyanosis. No joint deformity upper and lower extremities.   Skin: no significant rashes, lesions, ulcers. Warm, dry, well-perfused. Neurologic: CN 2-12 grossly intact. Sensation intact. Moving all extremities.  Psychiatric: Alert and oriented to person, place, and situation. Very pleasant and cooperative.    Labs and Imaging on Admission: I have personally reviewed following labs and imaging studies  CBC: Recent Labs  Lab 03/02/20 1929  WBC 18.3*  HGB 14.0  HCT 41.5  MCV 95.0  PLT 183   Basic Metabolic Panel: Recent Labs  Lab 03/02/20 1929  NA 132*  K 3.5  CL 94*  CO2 27  GLUCOSE 152*  BUN 13  CREATININE 0.76  CALCIUM 9.1   GFR: Estimated Creatinine Clearance: 68.1 mL/min (by C-G formula based on SCr of 0.76 mg/dL). Liver Function Tests: Recent Labs  Lab 03/02/20 1929  AST 18  ALT 19  ALKPHOS 42  BILITOT 1.7*  PROT 7.6  ALBUMIN 4.4   Recent Labs  Lab 03/02/20 1929  LIPASE 20   No results for input(s): AMMONIA in the last 168 hours. Coagulation Profile: No results for input(s): INR, PROTIME in the last 168 hours. Cardiac Enzymes: No results for input(s): CKTOTAL, CKMB, CKMBINDEX, TROPONINI in the last 168 hours. BNP (last 3 results) No results for input(s): PROBNP in the last 8760 hours. HbA1C: No results for input(s): HGBA1C in the last 72  hours. CBG: No results for input(s): GLUCAP in the last 168 hours. Lipid Profile: No results for input(s): CHOL, HDL, LDLCALC, TRIG, CHOLHDL, LDLDIRECT in the last 72 hours. Thyroid Function Tests: No results for input(s): TSH, T4TOTAL, FREET4, T3FREE, THYROIDAB in the last 72 hours. Anemia Panel: No results for input(s): VITAMINB12, FOLATE, FERRITIN, TIBC, IRON, RETICCTPCT in the last 72 hours. Urine analysis:    Component Value Date/Time   COLORURINE AMBER (A) 03/02/2020 1929   APPEARANCEUR CLEAR 03/02/2020 1929   LABSPEC 1.020 03/02/2020 1929   PHURINE 5.0 03/02/2020 1929   GLUCOSEU NEGATIVE 03/02/2020 1929   HGBUR NEGATIVE 03/02/2020 1929   BILIRUBINUR NEGATIVE 03/02/2020 1929   KETONESUR 5 (A) 03/02/2020 1929   PROTEINUR 30 (A) 03/02/2020 1929   NITRITE NEGATIVE 03/02/2020 1929   LEUKOCYTESUR NEGATIVE 03/02/2020 1929  Sepsis Labs: @LABRCNTIP (procalcitonin:4,lacticidven:4) ) Recent Results (from the past 240 hour(s))  SARS Coronavirus 2 by RT PCR (hospital order, performed in [facility] hospital lab) Nasopharyngeal Nasopharyngeal Swab     Status: None   Collection Time: 03/02/20  9:02 PM   Specimen: Nasopharyngeal Swab  Result Value Ref Range Status   SARS Coronavirus 2 NEGATIVE NEGATIVE Final    Comment: (NOTE) SARS-CoV-2 target nucleic acids are NOT DETECTED.  The SARS-CoV-2 RNA is generally detectable in upper and lower respiratory specimens during the acute phase of infection. The lowest concentration of SARS-CoV-2 viral copies this assay can detect is 250 copies / mL. A negative result does not preclude SARS-CoV-2 infection and should not be used as the sole basis for treatment or other patient management decisions.  A negative result may occur with improper specimen collection / handling, submission of specimen other than nasopharyngeal swab, presence of viral mutation(s) within the areas targeted by this assay, and inadequate number of viral copies (<250  copies / mL). A negative result must be combined with clinical observations, patient history, and epidemiological information.  Fact Sheet for Patients:   03/04/20  Fact Sheet for Healthcare Providers: BoilerBrush.com.cy  This test is not yet approved or  cleared by the https://pope.com/ FDA and has been authorized for detection and/or diagnosis of SARS-CoV-2 by FDA under an Emergency Use Authorization (EUA).  This EUA will remain in effect (meaning this test can be used) for the duration of the COVID-19 declaration under Section 564(b)(1) of the Act, 21 U.S.C. section 360bbb-3(b)(1), unless the authorization is terminated or revoked sooner.  Performed at [facility], [address], [place], [place]      Radiological Exams on Admission: CT ABDOMEN PELVIS W CONTRAST  Result Date: 03/02/2020 CLINICAL DATA:  Abdominal pain, fever EXAM: CT ABDOMEN AND PELVIS WITH CONTRAST TECHNIQUE: Multidetector CT imaging of the abdomen and pelvis was performed using the standard protocol following bolus administration of intravenous contrast. CONTRAST:  03/04/2020 OMNIPAQUE IOHEXOL 300 MG/ML  SOLN COMPARISON:  None FINDINGS: Lower chest: Lung bases are clear. Cardiac size at the upper limits of normal. No pericardial effusion. Hepatobiliary: Hypoattenuating focus in the caudate lobe measuring 1.2 cm, some possible centripetal fill in on delayed phase imaging. Few additional scattered subcentimeter hypertension foci too small to fully characterize on CT imaging but statistically likely benign. Smooth liver surface contour. Hepatic attenuation within expected normals for phase of imaging. Normal gallbladder and biliary tree without visible calcified gallstones. Pancreas: Unremarkable. No pancreatic ductal dilatation or surrounding inflammatory changes. Spleen: Normal in size without focal abnormality. Adrenals/Urinary Tract: There is  a 2.2 cm fat attenuation left adrenal nodule arising from the medial limb (2/18) and a more indeterminate 2.6 cm intermediate attenuation lesion arising from the lateral limb/body without significant washout on postcontrast imaging (2/21). Kidneys enhance and excrete symmetrically. Fluid attenuation cyst measuring 1.7 cm in the interpolar left kidney. Additional subcentimeter hypertension foci too small to fully characterize on CT imaging but statistically likely benign. No worrisome renal lesions. No urolithiasis or hydronephrosis. Urinary bladder is largely decompressed at the time of exam and therefore poorly evaluated by CT imaging. Bladder wall thickening is nonspecific, possibly related to underdistention though could correlate for symptoms cystitis. Stomach/Bowel: Distal esophagus, stomach and duodenum are unremarkable. There are several focally thickened segments of small bowel in the right mid abdomen (5/57 for exam) which appears reactive to the adjacent phlegmon and inflammation centered upon the tip of the appendix with  evidence of perforation given small punctate foci of extraluminal gas and free fluid. Much of the colon is otherwise unremarkable aside from a small amount of adjacent reactive thickening upon the mid sigmoid in a region of several colonic diverticula possibly causing a reactive diverticulitis. Vascular/Lymphatic: Atherosclerotic calcifications within the abdominal aorta and branch vessels. No aneurysm or ectasia. No enlarged abdominopelvic lymph nodes. Reactive adenopathy is present in the mesentery adjacent the appendiceal perforation. Reproductive: Borderline prostatomegaly. Few coarse eccentric calcifications. Seminal vesicles are unremarkable. Other: Perforated appendicitis with surrounding phlegmonous change and few foci of extraluminal gas and trace free fluid. Some associated peritoneal thickening. No other abdominopelvic free air or fluid. No bowel containing hernias. Small fat  containing umbilical hernia. Musculoskeletal: No acute osseous abnormality or suspicious osseous lesion. Multilevel degenerative changes are present in the imaged portions of the spine. Stepwise retrolisthesis L1-L3 and grade 1 anterolisthesis L4 on 5. Advanced multilevel discogenic change and facet arthropathy resulting in multilevel moderate to severe foraminal and canal stenoses throughout the lumbar spine. Additional moderate degenerative changes in the hips and pelvis. IMPRESSION: 1. Appendicitis with likely small microperforation with surrounding phlegmonous change and few foci of extraluminal gas and trace free fluid. No organized collection is seen at this time. 2. Several focally thickened segments of adjacent small bowel and the mid sigmoid including several minimally colonic diverticula favored to be reactive/secondary to the appendicitis. 3. Bladder wall thickening is nonspecific, possibly related to underdistention though could correlate for symptoms of cystitis. 4. Left adrenal myelolipoma. Additional indeterminate 2.6 cm intermediate attenuation lesion arising from the left adrenal gland. Could consider further evaluation with nonemergent adrenal protocol CT or MRI. This recommendation follows ACR consensus guidelines: Management of Incidental Adrenal Masses: A White Paper of the ACR Incidental Findings Committee. J Am Coll Radiol 2017;14:1038-1044. 5. Aortic Atherosclerosis (ICD10-I70.0). Electronically Signed   By: Kreg ShropshirePrice  DeHay M.D.   On: 03/02/2020 21:59    EKG: Independently reviewed. Atrial fibrillation, rate 85, RBBB.   Assessment/Plan   1. Acute appendicitis  - Presents with one day of abdominal pain and fever at home that resolved with APAP, and is found to have leukocytosis to 18.3k with SBP 98, normal lactate, and CT findings concerning for acute appendicitis with microperforation  - Surgery is consulting and much appreciated  - Blood cultures were collected in ED and patient was  treated with IVF and Zosyn  - Continue bowel-rest, continue Zosyn, pain-control, gentle IVF hydration    2. Atrial fibrillation - In rate-controlled atrial fibrillation on admission  - CHADS-VASc is at least 594 (age x2, CHF, HTN)  - Hold Eliquis and use IV heparin for now should he need surgery   3. Hypertension - BP was 98/53 in ED, now 120 systolic after a liter of IVF  - Continue to hold antihypertensives for now    4. Chronic diastolic CHF  - Appears compensated  - Monitor weight and I/Os   5. Left adrenal nodule  - Noted incidentally on CT in ED  - Outpatient follow-up recommended     DVT prophylaxis: Eliquis pta, IV heparin for now  Code Status: Full  Family Communication: Discussed with patient  Disposition Plan:  Patient is from: Home  Anticipated d/c is to: TBD Anticipated d/c date is: 03/07/20 Patient currently: Requiring IV antibiotics and close monitoring of acute appendicitis  Consults called: Surgery  Admission status: Inpatient     Briscoe Deutscherimothy S Amiir Heckard, MD [facility]  03/03/2020, 12:47 AM

## 2020-03-03 NOTE — Progress Notes (Signed)
ANTICOAGULATION CONSULT NOTE - Follow Up Consult  Pharmacy Consult for Heparin Indication: atrial fibrillation    On Eliquis at home with last dose am of 7/17 but may need appendectomy this admission  Allergies  Allergen Reactions  . Bee Venom Other (See Comments)    Other reaction(s): fainting, decreased BP  . Chromium Other (See Comments)    rash  . Gabapentin Other (See Comments)    Can't take capsules, tablets are tolerated 7/18 pt states allergy is resolved now, can take capsules   . Sulfa Antibiotics Rash    Patient Measurements: Height: 5\' 6"  (167.6 cm) Weight: 82.7 kg (182 lb 5.1 oz) IBW/kg (Calculated) : 63.8 Heparin Dosing Weight:   Vital Signs: Temp: 98 F (36.7 C) (07/18 2124) Temp Source: Oral (07/18 2124) BP: 105/56 (07/18 2124) Pulse Rate: 51 (07/18 2124)  Labs: Recent Labs    03/02/20 1929 03/03/20 0701 03/03/20 1058 03/03/20 2133  HGB 14.0 13.2  --   --   HCT 41.5 39.0  --   --   PLT 183 142*  --   --   APTT  --   --  52* 120*  HEPARINUNFRC  --   --  1.36*  --   CREATININE 0.76 0.55*  --   --     Estimated Creatinine Clearance: 68.2 mL/min (A) (by C-G formula based on SCr of 0.55 mg/dL (L)).   Medications:  Infusions:  . heparin 1,100 Units/hr (03/03/20 1248)  . piperacillin-tazobactam (ZOSYN)  IV 3.375 g (03/03/20 2159)    Assessment: Patient with high PTT.  PTT ordered with Heparin level until both correlate due to possible drug-lab interaction between oral anticoagulant (rivaroxaban, edoxaban, or apixaban) and anti-Xa level (aka heparin level)   No heparin issues per RN.  Goal of Therapy:  Heparin level 0.3-0.7 units/ml aPTT 66-102 seconds Monitor platelets by anticoagulation protocol: Yes   Plan:  Decrease heparin to 1050 units/hr Recheck PTT/heparin level at 0800  2[address] [place] 03/03/2020,11:44 PM

## 2020-03-03 NOTE — Progress Notes (Signed)
ANTICOAGULATION CONSULT NOTE - Follow Up  Pharmacy Consult for Heparin Indication: atrial fibrillation   On Eliquis at home with last dose am of 7/17 but may need appendectomy this admission   Allergies  Allergen Reactions  . Bee Venom Other (See Comments)    Other reaction(s): fainting, decreased BP  . Chromium Other (See Comments)    rash  . Gabapentin Other (See Comments)    Can't take capsules, tablets are tolerated 7/18 pt states allergy is resolved now, can take capsules   . Sulfa Antibiotics Rash    Patient Measurements: Height: 5\' 6"  (167.6 cm) Weight: 82.7 kg (182 lb 5.1 oz) IBW/kg (Calculated) : 63.8 Heparin Dosing Weight: 83 kg  Vital Signs: Temp: 98.1 F (36.7 C) (07/18 1008) Temp Source: Oral (07/18 1008) BP: 110/54 (07/18 1008) Pulse Rate: 77 (07/18 1008)  Labs: Recent Labs    03/02/20 1929 03/03/20 0701 03/03/20 1058  HGB 14.0 13.2  --   HCT 41.5 39.0  --   PLT 183 142*  --   APTT  --   --  52*  HEPARINUNFRC  --   --  1.36*  CREATININE 0.76 0.55*  --     Estimated Creatinine Clearance: 68.2 mL/min (A) (by C-G formula based on SCr of 0.55 mg/dL (L)).   Medical History: Past Medical History:  Diagnosis Date  . A-fib (HCC)   . CHF (congestive heart failure) (HCC)   . Hypertension     Medications:  Infusions:  . sodium chloride 75 mL/hr at 03/03/20 0600  . heparin 1,000 Units/hr (03/03/20 0600)  . piperacillin-tazobactam (ZOSYN)  IV 12.5 mL/hr at 03/03/20 0600    Assessment: Patient with hx of AFib and prior apixaban 5mg  po bid dosing, with last dose noted 7/17 at 0830.  PTT ordered with Heparin level until both correlate due to possible drug-lab interaction between oral anticoagulant (rivaroxaban, edoxaban, or apixaban) and anti-Xa level (aka heparin level)  Today, 03/03/20  APTT SUBtherapeutic on current IV heparin rate of 1000 units/hr.   Note that heparin level is supratherapeutic, which is a reflection of the patient taking  apixaban and not a true level  Per RN, patient lost IV access and heparin infusion was off for about an hour this AM from ~0900 to ~1000  Hgb stable, Plts dropped to 142 from 183 - monitor closely   Goal of Therapy:  Heparin level 0.3-0.7 units/ml aPTT 66-102 seconds Monitor platelets by anticoagulation protocol: Yes   Plan:   Due to loss of IV access, will only increase IV heparin slightly from 1000 to 1100 units/hr  Recheck aPTT 8 hours after rate increase  Daily CBC and HL   8/17 03/03/2020,12:09 PM

## 2020-03-03 NOTE — Progress Notes (Signed)
ANTICOAGULATION CONSULT NOTE - Initial Consult  Pharmacy Consult for Heparin Indication: atrial fibrillation   On Eliquis at home with last dose am of 7/17 but may need appendectomy this admission   Allergies  Allergen Reactions  . Bee Venom Other (See Comments)    Other reaction(s): fainting, decreased BP  . Chromium Other (See Comments)    rash  . Gabapentin Other (See Comments)    Can't take capsules, tablets are tolerated 7/18 pt states allergy is resolved now, can take capsules   . Sulfa Antibiotics Rash    Patient Measurements: Height: 5\' 6"  (167.6 cm) Weight: 82.6 kg (182 lb) IBW/kg (Calculated) : 63.8 Heparin Dosing Weight:   Vital Signs: Temp: 100.2 F (37.9 C) (07/18 0056) Temp Source: Oral (07/18 0056) BP: 153/75 (07/18 0056) Pulse Rate: 89 (07/18 0056)  Labs: Recent Labs    03/02/20 1929  HGB 14.0  HCT 41.5  PLT 183  CREATININE 0.76    Estimated Creatinine Clearance: 68.1 mL/min (by C-G formula based on SCr of 0.76 mg/dL).   Medical History: Past Medical History:  Diagnosis Date  . A-fib (HCC)   . CHF (congestive heart failure) (HCC)   . Hypertension     Medications:  Infusions:  . sodium chloride 75 mL/hr at 03/03/20 0125  . heparin    . piperacillin-tazobactam (ZOSYN)  IV      Assessment: Patient with hx of AFib and prior apixaban 5mg  po bid dosing, with last dose noted 7/17 at 0830.  PTT ordered with Heparin level until both correlate due to possible drug-lab interaction between oral anticoagulant (rivaroxaban, edoxaban, or apixaban) and anti-Xa level (aka heparin level)   Goal of Therapy:  Heparin level 0.3-0.7 units/ml aPTT 66-102 seconds Monitor platelets by anticoagulation protocol: Yes   Plan:  Heparin at 1000 units/hr Daily CBC Heparin level and PTT at [address] [place] 03/03/2020,1:39 AM

## 2020-03-03 NOTE — Progress Notes (Signed)
PROGRESS NOTE    Donald [name]  ZOX:[id number] DOB: 1933-06-04 DOA: 03/02/2020 PCP: Daiva NakayamaPa, [facility]   Brief Narrative:  Donald Grillshomas Artman is a 86 y.o. male with medical history significant for atrial fibrillation on Eliquis, hypertension, and chronic diastolic CHF, now presenting to emergency department for evaluation of abdominal pain and fever.  Patient reports he been in his usual state of health until 03/01/2020 when he developed mid abdominal pain and fever.  He was unable to identify any precipitating event for his symptoms but describes moderate to severe pain localized to the mid abdomen and temperature of 100.6 F at home.  He took Tylenol at home with improvement in his symptoms, but the pain began to worsen again, has been persistent, and eventually prompted his presentation to the ED.  Patient denies any associated vomiting, diarrhea, melena, or hematochezia.  He had been doing well leading up to this and denies any recent chest pain, cough, or shortness of breath.Upon arrival to the ED, patient is found to be afebrile, saturating well on room air, and with blood pressure 98/53.  EKG features atrial fibrillation with RBBB.  Chemistry panel with mild hyponatremia and CBC with leukocytosis to 18,300.  Lactic acid is reassuringly normal.  COVID-19 screening test was negative.  CT of the abdomen and pelvis is concerning for acute appendicitis with suspected microperforation, and also notable for an incidental left adrenal lesion.  Blood cultures were collected in the emergency department, a liter of saline was administered, and the patient was started on Zosyn.  Surgery was consulted by the ED physician, has evaluated patient in the emergency department, and recommends medical admission.   Assessment & Plan:   Principal Problem:   Acute appendicitis Active Problems:   A-fib (HCC)   Hypertension   Chronic diastolic CHF (congestive heart failure) (HCC)   Hyponatremia   Adrenal nodule  (HCC)   Sepsis in the setting of acute appendicitis, POA  - Presents with tachypnea, leukocytosis with notable infection on CT concerning for acute appendicitis with possible microperforation  -Surgery following, appreciate insight and recommendations, nonoperative treatment at this time with IV antibiotics and bowel rest.   -Pain currently well controlled on current regimen - Cultures collected at intake, follow for results  - Continue bowel-rest, continue Zosyn, pain-control, gentle IVF hydration    Atrial fibrillation, rate controlled - In rate-controlled atrial fibrillation on admission  - CHADS-VASc is at least 594 (age x2, CHF, HTN)  - Hold Eliquis and use IV heparin for now in case patient needs procedure   Essential hypertension - BP was 98/53 in ED likely in the setting of poor p.o. intake, resolving with IV fluids  Chronic diastolic CHF, not in acute exacerbation  - Monitor I's and O's, daily weights while n.p.o. and on IV fluids -We will decrease IV fluid intake as p.o. tolerance improves appropriately   Incidentally noted left adrenal nodule  - On CT at admission - Outpatient follow-up recommended     DVT prophylaxis: Heparin gtt (perioperatively - on eliquis at home) Code Status: Full Family Communication: None present  Status is: Inpatient  Dispo: The patient is from: Home              Anticipated d/c is to: Home              Anticipated d/c date is: 24 to 48 hours pending clinical course              Patient currently not medically stable  for discharge due to ongoing need for IV fluids in the setting of poor p.o. intake and possible surgical procedure given above  Consultants:   Gen Sx  Procedures:   None planned  Antimicrobials:  Zosyn 7/17 --> ongoing   Subjective: No acute issues or events overnight, abdominal pain markedly improving but not yet resolved, denies nausea or vomiting this morning.  Otherwise denies chest pain, shortness of breath,  headache, fevers, chills.  Objective: Vitals:   03/03/20 0000 03/03/20 0056 03/03/20 0500 03/03/20 0535  BP: 122/60 (!) 153/75  127/71  Pulse: 69 89  65  Resp: 16 20  16   Temp:  100.2 F (37.9 C)  98.4 F (36.9 C)  TempSrc:  Oral  Oral  SpO2: 96% 93%  94%  Weight:   82.7 kg   Height:        Intake/Output Summary (Last 24 hours) at 03/03/2020 0750 Last data filed at 03/03/2020 0600 Gross per 24 hour  Intake 611.5 ml  Output --  Net 611.5 ml   Filed Weights   03/02/20 1916 03/03/20 0500  Weight: 82.6 kg 82.7 kg    Examination:  General exam: Appears calm and comfortable  Respiratory system: Clear to auscultation. Respiratory effort normal. Cardiovascular system: S1 & S2 heard, RRR. No JVD, murmurs, rubs, gallops or clicks. No pedal edema. Gastrointestinal system: Nondistended, soft, diffusely tender, PMI umbilical region. Central nervous system: Alert and oriented. No focal neurological deficits. Extremities: Symmetric 5 x 5 power. Skin: No rashes, lesions or ulcers Psychiatry: Judgement and insight appear normal. Mood & affect appropriate.     Data Reviewed: I have personally reviewed following labs and imaging studies  CBC: Recent Labs  Lab 03/02/20 1929 03/03/20 0701  WBC 18.3* 16.4*  NEUTROABS  --  13.8*  HGB 14.0 13.2  HCT 41.5 39.0  MCV 95.0 94.4  PLT 183 142*   Basic Metabolic Panel: Recent Labs  Lab 03/02/20 1929  NA 132*  K 3.5  CL 94*  CO2 27  GLUCOSE 152*  BUN 13  CREATININE 0.76  CALCIUM 9.1   GFR: Estimated Creatinine Clearance: 68.2 mL/min (by C-G formula based on SCr of 0.76 mg/dL). Liver Function Tests: Recent Labs  Lab 03/02/20 1929  AST 18  ALT 19  ALKPHOS 42  BILITOT 1.7*  PROT 7.6  ALBUMIN 4.4   Recent Labs  Lab 03/02/20 1929  LIPASE 20   No results for input(s): AMMONIA in the last 168 hours. Coagulation Profile: No results for input(s): INR, PROTIME in the last 168 hours. Cardiac Enzymes: No results for  input(s): CKTOTAL, CKMB, CKMBINDEX, TROPONINI in the last 168 hours. BNP (last 3 results) No results for input(s): PROBNP in the last 8760 hours. HbA1C: No results for input(s): HGBA1C in the last 72 hours. CBG: No results for input(s): GLUCAP in the last 168 hours. Lipid Profile: No results for input(s): CHOL, HDL, LDLCALC, TRIG, CHOLHDL, LDLDIRECT in the last 72 hours. Thyroid Function Tests: No results for input(s): TSH, T4TOTAL, FREET4, T3FREE, THYROIDAB in the last 72 hours. Anemia Panel: No results for input(s): VITAMINB12, FOLATE, FERRITIN, TIBC, IRON, RETICCTPCT in the last 72 hours. Sepsis Labs: Recent Labs  Lab 03/02/20 2102  LATICACIDVEN 1.3    Recent Results (from the past 240 hour(s))  Blood culture (routine x 2)     Status: None (Preliminary result)   Collection Time: 03/02/20  9:02 PM   Specimen: BLOOD  Result Value Ref Range Status   Specimen Description   Final  BLOOD LEFT ANTECUBITAL Performed at [facility], [address], [place], [place] [address]    Special Requests   Final    BOTTLES DRAWN AEROBIC AND ANAEROBIC Blood Culture adequate volume Performed at [facility], [address], [place], [place] [address]    Culture PENDING  Incomplete   Report Status PENDING  Incomplete  SARS Coronavirus 2 by RT PCR (hospital order, performed in [facility] hospital lab) Nasopharyngeal Nasopharyngeal Swab     Status: None   Collection Time: 03/02/20  9:02 PM   Specimen: Nasopharyngeal Swab  Result Value Ref Range Status   SARS Coronavirus 2 NEGATIVE NEGATIVE Final    Comment: (NOTE) SARS-CoV-2 target nucleic acids are NOT DETECTED.  The SARS-CoV-2 RNA is generally detectable in upper and lower respiratory specimens during the acute phase of infection. The lowest concentration of SARS-CoV-2 viral copies this assay can detect is 250 copies / mL. A negative result does not preclude SARS-CoV-2 infection and should not be used as the  sole basis for treatment or other patient management decisions.  A negative result may occur with improper specimen collection / handling, submission of specimen other than nasopharyngeal swab, presence of viral mutation(s) within the areas targeted by this assay, and inadequate number of viral copies (<250 copies / mL). A negative result must be combined with clinical observations, patient history, and epidemiological information.  Fact Sheet for Patients:   BoilerBrush.com.cy  Fact Sheet for Healthcare Providers: https://pope.com/  This test is not yet approved or  cleared by the [place] FDA and has been authorized for detection and/or diagnosis of SARS-CoV-2 by FDA under an Emergency Use Authorization (EUA).  This EUA will remain in effect (meaning this test can be used) for the duration of the COVID-19 declaration under Section 564(b)(1) of the Act, 21 U.S.C. section 360bbb-3(b)(1), unless the authorization is terminated or revoked sooner.  Performed at [facility], [address], [place], [place] [address]          Radiology Studies: CT ABDOMEN PELVIS W CONTRAST  Result Date: 03/02/2020 CLINICAL DATA:  Abdominal pain, fever EXAM: CT ABDOMEN AND PELVIS WITH CONTRAST TECHNIQUE: Multidetector CT imaging of the abdomen and pelvis was performed using the standard protocol following bolus administration of intravenous contrast. CONTRAST:  OMNIPAQUE IOHEXOL 300 MG/ML  SOLN COMPARISON:  None FINDINGS: Lower chest: Lung bases are clear. Cardiac size at the upper limits of normal. No pericardial effusion. Hepatobiliary: Hypoattenuating focus in the caudate lobe measuring 1.2 cm, some possible centripetal fill in on delayed phase imaging. Few additional scattered subcentimeter hypertension foci too small to fully characterize on CT imaging but statistically likely benign. Smooth liver surface contour. Hepatic  attenuation within expected normals for phase of imaging. Normal gallbladder and biliary tree without visible calcified gallstones. Pancreas: Unremarkable. No pancreatic ductal dilatation or surrounding inflammatory changes. Spleen: Normal in size without focal abnormality. Adrenals/Urinary Tract: There is a 2.2 cm fat attenuation left adrenal nodule arising from the medial limb (2/18) and a more indeterminate 2.6 cm intermediate attenuation lesion arising from the lateral limb/body without significant washout on postcontrast imaging (2/21). Kidneys enhance and excrete symmetrically. Fluid attenuation cyst measuring 1.7 cm in the interpolar left kidney. Additional subcentimeter hypertension foci too small to fully characterize on CT imaging but statistically likely benign. No worrisome renal lesions. No urolithiasis or hydronephrosis. Urinary bladder is largely decompressed at the time of exam and therefore poorly evaluated by CT imaging. Bladder wall thickening  is nonspecific, possibly related to underdistention though could correlate for symptoms cystitis. Stomach/Bowel: Distal esophagus, stomach and duodenum are unremarkable. There are several focally thickened segments of small bowel in the right mid abdomen (5/57 for exam) which appears reactive to the adjacent phlegmon and inflammation centered upon the tip of the appendix with evidence of perforation given small punctate foci of extraluminal gas and free fluid. Much of the colon is otherwise unremarkable aside from a small amount of adjacent reactive thickening upon the mid sigmoid in a region of several colonic diverticula possibly causing a reactive diverticulitis. Vascular/Lymphatic: Atherosclerotic calcifications within the abdominal aorta and branch vessels. No aneurysm or ectasia. No enlarged abdominopelvic lymph nodes. Reactive adenopathy is present in the mesentery adjacent the appendiceal perforation. Reproductive: Borderline prostatomegaly. Few  coarse eccentric calcifications. Seminal vesicles are unremarkable. Other: Perforated appendicitis with surrounding phlegmonous change and few foci of extraluminal gas and trace free fluid. Some associated peritoneal thickening. No other abdominopelvic free air or fluid. No bowel containing hernias. Small fat containing umbilical hernia. Musculoskeletal: No acute osseous abnormality or suspicious osseous lesion. Multilevel degenerative changes are present in the imaged portions of the spine. Stepwise retrolisthesis L1-L3 and grade 1 anterolisthesis L4 on 5. Advanced multilevel discogenic change and facet arthropathy resulting in multilevel moderate to severe foraminal and canal stenoses throughout the lumbar spine. Additional moderate degenerative changes in the hips and pelvis. IMPRESSION: 1. Appendicitis with likely small microperforation with surrounding phlegmonous change and few foci of extraluminal gas and trace free fluid. No organized collection is seen at this time. 2. Several focally thickened segments of adjacent small bowel and the mid sigmoid including several minimally colonic diverticula favored to be reactive/secondary to the appendicitis. 3. Bladder wall thickening is nonspecific, possibly related to underdistention though could correlate for symptoms of cystitis. 4. Left adrenal myelolipoma. Additional indeterminate 2.6 cm intermediate attenuation lesion arising from the left adrenal gland. Could consider further evaluation with nonemergent adrenal protocol CT or MRI. This recommendation follows ACR consensus guidelines: Management of Incidental Adrenal Masses: A White Paper of the ACR Incidental Findings Committee. J Am Coll Radiol 2017;14:1038-1044. 5. Aortic Atherosclerosis (ICD10-I70.0). Electronically Signed   By: Kreg Shropshire M.D.   On: 03/02/2020 21:59        Scheduled Meds: . gabapentin  300 mg Oral TID  . sodium chloride flush  3 mL Intravenous Q12H   Continuous Infusions: .  sodium chloride 75 mL/hr at 03/03/20 0600  . heparin 1,000 Units/hr (03/03/20 0600)  . piperacillin-tazobactam (ZOSYN)  IV 12.5 mL/hr at 03/03/20 0600     LOS: 1 day   Time spent:   Azucena Fallen, DO [facility]  If 7PM-7AM, please contact night-coverage [email or web address]  03/03/2020, 7:50 AM

## 2020-03-04 LAB — CBC WITH DIFFERENTIAL/PLATELET
Abs Immature Granulocytes: 0.07 10*3/uL (ref 0.00–0.07)
Basophils Absolute: 0 10*3/uL (ref 0.0–0.1)
Basophils Relative: 0 %
Eosinophils Absolute: 0 10*3/uL (ref 0.0–0.5)
Eosinophils Relative: 0 %
HCT: 39.2 % (ref 39.0–52.0)
Hemoglobin: 13.3 g/dL (ref 13.0–17.0)
Immature Granulocytes: 1 %
Lymphocytes Relative: 8 %
Lymphs Abs: 1.2 10*3/uL (ref 0.7–4.0)
MCH: 32.5 pg (ref 26.0–34.0)
MCHC: 33.9 g/dL (ref 30.0–36.0)
MCV: 95.8 fL (ref 80.0–100.0)
Monocytes Absolute: 0.6 10*3/uL (ref 0.1–1.0)
Monocytes Relative: 4 %
Neutro Abs: 12.4 10*3/uL — ABNORMAL HIGH (ref 1.7–7.7)
Neutrophils Relative %: 87 %
Platelets: 145 10*3/uL — ABNORMAL LOW (ref 150–400)
RBC: 4.09 MIL/uL — ABNORMAL LOW (ref 4.22–5.81)
RDW: 12.3 % (ref 11.5–15.5)
WBC: 14.3 10*3/uL — ABNORMAL HIGH (ref 4.0–10.5)
nRBC: 0 % (ref 0.0–0.2)

## 2020-03-04 LAB — BASIC METABOLIC PANEL
Anion gap: 13 (ref 5–15)
BUN: 14 mg/dL (ref 8–23)
CO2: 27 mmol/L (ref 22–32)
Calcium: 8.7 mg/dL — ABNORMAL LOW (ref 8.9–10.3)
Chloride: 95 mmol/L — ABNORMAL LOW (ref 98–111)
Creatinine, Ser: 0.55 mg/dL — ABNORMAL LOW (ref 0.61–1.24)
GFR calc Af Amer: >60 mL/min (ref >60)
GFR calc non Af Amer: >60 mL/min (ref >60)
Glucose, Bld: 114 mg/dL — ABNORMAL HIGH (ref 70–99)
Potassium: 3 mmol/L — ABNORMAL LOW (ref 3.5–5.1)
Sodium: 135 mmol/L (ref 135–145)

## 2020-03-04 LAB — URINE CULTURE: Culture: NO GROWTH

## 2020-03-04 LAB — MAGNESIUM: Magnesium: 1.5 mg/dL — ABNORMAL LOW (ref 1.7–2.4)

## 2020-03-04 LAB — APTT
aPTT: 100 seconds — ABNORMAL HIGH (ref 24–36)
aPTT: 82 seconds — ABNORMAL HIGH (ref 24–36)

## 2020-03-04 LAB — HEPARIN LEVEL (UNFRACTIONATED)
Heparin Unfractionated: 1.09 IU/mL — ABNORMAL HIGH (ref 0.30–0.70)
Heparin Unfractionated: 0.74 IU/mL — ABNORMAL HIGH (ref 0.30–0.70)

## 2020-03-04 MED ORDER — POTASSIUM CHLORIDE 20 MEQ PO PACK
40.0000 meq | PACK | Freq: Two times a day (BID) | ORAL | Status: AC
  Administered 2020-03-04 (×2): 40 meq via ORAL
  Filled 2020-03-04 (×2): qty 2

## 2020-03-04 NOTE — Progress Notes (Signed)
ANTICOAGULATION CONSULT NOTE - Follow Up Consult  Pharmacy Consult for heparin Indication: hx atrial fibrillation (homeEliquis on hold)  Allergies  Allergen Reactions   Bee Venom Other (See Comments)    Other reaction(s): fainting, decreased BP   Chromium Other (See Comments)    rash   Gabapentin Other (See Comments)    Can't take capsules, tablets are tolerated 7/18 pt states allergy is resolved now, can take capsules    Sulfa Antibiotics Rash    Patient Measurements: Height: 5\' 6"  (167.6 cm) Weight: 82.7 kg (182 lb 5.1 oz) IBW/kg (Calculated) : 63.8 Heparin Dosing Weight: 80 kg  Vital Signs: Temp: 98 F (36.7 C) (07/19 1342) Temp Source: Oral (07/19 1342) BP: 100/98 (07/19 1342) Pulse Rate: 100 (07/19 1342)  Labs: Recent Labs    03/02/20 1929 03/02/20 1929 03/03/20 0701 03/03/20 1058 03/03/20 2133 03/04/20 0740 03/04/20 1546  HGB 14.0   < > 13.2  --   --  13.3  --   HCT 41.5  --  39.0  --   --  39.2  --   PLT 183  --  142*  --   --  145*  --   APTT  --   --   --  52* 120* 100*  --   HEPARINUNFRC  --   --   --  1.36*  --  1.09* 0.74*  CREATININE 0.76  --  0.55*  --   --  0.55*  --    < > = values in this interval not displayed.    Estimated Creatinine Clearance: 68.2 mL/min (A) (by C-G formula based on SCr of 0.55 mg/dL (L)).   Medications:  - on Eliquis 5 mg bid (last dose 7/17 at 0830)  Assessment: Patient is an 86 y.o M with hx afib on Eliquis PTA, presented to the ED in 7/17 with c/o abdominal pain.  Abdominal CT showed appendicitis with small microperforation.  He was transitioned to heparin drip on admission.   Today, 03/04/2020: - heparin level is elevated at 0.74  but this is likely from residual effect of Eliquis - aPTT is therapeutic at 83 secs on 1050 units/hr - cbc stable - CCS recom. non-operative management at this time  Goal of Therapy:  Heparin level 0.3-0.7 units/ml aPTT 66-102 seconds Monitor platelets by anticoagulation  protocol: Yes   Plan:  - continue heparin drip at 1050 units/hr - daily heparin level, aPTT, CBC - monitor for s/sx bleeding  03/06/2020, Pharm.D 03/04/2020 4:51 PM

## 2020-03-04 NOTE — Progress Notes (Signed)
PROGRESS NOTE    Donald [name]  SWF:[id number] DOB: 1933/04/24 DOA: 03/02/2020 PCP: [facility]   Brief Narrative:  Donald [name] is a 86 y.o. male with medical history significant for atrial fibrillation on Eliquis, hypertension, and chronic diastolic CHF, now presenting to emergency department for evaluation of abdominal pain and fever.  Patient reports he been in his usual state of health until 03/01/2020 when he developed mid abdominal pain and fever.  He was unable to identify any precipitating event for his symptoms but describes moderate to severe pain localized to the mid abdomen and temperature of 100.6 F at home.  He took Tylenol at home with improvement in his symptoms, but the pain began to worsen again, has been persistent, and eventually prompted his presentation to the ED.  Patient denies any associated vomiting, diarrhea, melena, or hematochezia.  He had been doing well leading up to this and denies any recent chest pain, cough, or shortness of breath.Upon arrival to the ED, patient is found to be afebrile, saturating well on room air, and with blood pressure 98/53.  EKG features atrial fibrillation with RBBB.  Chemistry panel with mild hyponatremia and CBC with leukocytosis to 18,300.  Lactic acid is reassuringly normal.  COVID-19 screening test was negative.  CT of the abdomen and pelvis is concerning for acute appendicitis with suspected microperforation, and also notable for an incidental left adrenal lesion.  Blood cultures were collected in the emergency department, a liter of saline was administered, and the patient was started on Zosyn.  Surgery was consulted by the ED physician, has evaluated patient in the emergency department, and recommends medical admission.  Assessment & Plan:   Principal Problem:   Acute appendicitis Active Problems:   A-fib (HCC)   Hypertension   Chronic diastolic CHF (congestive heart failure) (HCC)   Hyponatremia   Adrenal nodule  (HCC)   Sepsis in the setting of acute appendicitis with possible microperforation, POA  - Presents with tachypnea, leukocytosis with notable infection on CT concerning for acute appendicitis with possible microperforation  -Surgery following, appreciate insight and recommendations, nonoperative treatment at this time with IV antibiotics and bowel rest.   - Pain currently well controlled on current regimen - Cultures collected at intake, follow for results -currently pending negative - Continue bowel-rest, continue Zosyn, pain-control, gentle IVF hydration -likely require 10 to 14-day course of antibiotics to ensure resolution of any intra-abdominal infection given questionable microperforation.  Atrial fibrillation, unspecified, rate controlled - In rate-controlled atrial fibrillation on admission  - CHADS-VASc is at least 102 (age x2, CHF, HTN)  - Hold Eliquis and use IV heparin for now in case patient needs procedure   Essential hypertension - BP was 98/53 in ED likely in the setting of poor p.o. intake, resolving with IV fluids  Chronic diastolic CHF, not in acute exacerbation  - Monitor I's and O's, daily weights while n.p.o. and on IV fluids -We will decrease IV fluid intake as p.o. tolerance improves appropriately   Incidentally noted left adrenal nodule  - On CT at admission - Outpatient follow-up recommended     DVT prophylaxis: Heparin gtt (perioperatively - on eliquis at home) Code Status: Full Family Communication: None present  Status is: Inpatient  Dispo: The patient is from: Home              Anticipated d/c is to: Home              Anticipated d/c date is: 24 to 77  hours pending clinical course              Patient currently not medically stable for discharge due to ongoing need for IV fluids in the setting of poor p.o. intake and possible surgical procedure given above  Consultants:   Gen Sx  Procedures:   None planned  Antimicrobials:  Zosyn 7/17 -->  ongoing   Subjective: No acute issues or events overnight, abdominal pain markedly improving but not yet resolved, denies nausea or vomiting this morning.  Otherwise denies chest pain, shortness of breath, headache, fevers, chills.   Objective: Vitals:   03/03/20 1008 03/03/20 1321 03/03/20 2124 03/04/20 0434  BP: (!) 110/54 (!) 116/54 (!) 105/56 131/64  Pulse: 77 75 (!) 51 91  Resp: 20 18 18 17   Temp: 98.1 F (36.7 C) 98.5 F (36.9 C) 98 F (36.7 C) 98.9 F (37.2 C)  TempSrc: Oral Oral Oral Oral  SpO2: 96% 96% 97% 95%  Weight:      Height:        Intake/Output Summary (Last 24 hours) at 03/04/2020 0759 Last data filed at 03/03/2020 2200 Gross per 24 hour  Intake 1106.33 ml  Output --  Net 1106.33 ml   Filed Weights   03/02/20 1916 03/03/20 0500  Weight: 82.6 kg 82.7 kg    Examination:  General exam: Appears calm and comfortable  Respiratory system: Clear to auscultation. Respiratory effort normal. Cardiovascular system: S1 & S2 heard, RRR. No JVD, murmurs, rubs, gallops or clicks. No pedal edema. Gastrointestinal system: Nondistended, soft, diffusely tender, PMI umbilical region. Central nervous system: Alert and oriented. No focal neurological deficits. Extremities: Symmetric 5 x 5 power. Skin: No rashes, lesions or ulcers Psychiatry: Judgement and insight appear normal. Mood & affect appropriate.    Data Reviewed: I have personally reviewed following labs and imaging studies  CBC: Recent Labs  Lab 03/02/20 1929 03/03/20 0701 03/04/20 0740  WBC 18.3* 16.4* 14.3*  NEUTROABS  --  13.8* 12.4*  HGB 14.0 13.2 13.3  HCT 41.5 39.0 39.2  MCV 95.0 94.4 95.8  PLT 183 142* 145*   Basic Metabolic Panel: Recent Labs  Lab 03/02/20 1929 03/03/20 0701  NA 132* 136  K 3.5 3.2*  CL 94* 98  CO2 27 26  GLUCOSE 152* 109*  BUN 13 13  CREATININE 0.76 0.55*  CALCIUM 9.1 8.7*  MG  --  1.6*   GFR: Estimated Creatinine Clearance: 68.2 mL/min (A) (by C-G formula based  on SCr of 0.55 mg/dL (L)). Liver Function Tests: Recent Labs  Lab 03/02/20 1929 03/03/20 0701  AST 18 14*  ALT 19 15  ALKPHOS 42 35*  BILITOT 1.7* 2.1*  PROT 7.6 6.6  ALBUMIN 4.4 3.8   Recent Labs  Lab 03/02/20 1929  LIPASE 20   No results for input(s): AMMONIA in the last 168 hours. Coagulation Profile: No results for input(s): INR, PROTIME in the last 168 hours. Cardiac Enzymes: No results for input(s): CKTOTAL, CKMB, CKMBINDEX, TROPONINI in the last 168 hours. BNP (last 3 results) No results for input(s): PROBNP in the last 8760 hours. HbA1C: No results for input(s): HGBA1C in the last 72 hours. CBG: No results for input(s): GLUCAP in the last 168 hours. Lipid Profile: No results for input(s): CHOL, HDL, LDLCALC, TRIG, CHOLHDL, LDLDIRECT in the last 72 hours. Thyroid Function Tests: No results for input(s): TSH, T4TOTAL, FREET4, T3FREE, THYROIDAB in the last 72 hours. Anemia Panel: No results for input(s): VITAMINB12, FOLATE, FERRITIN, TIBC, IRON, RETICCTPCT in  the last 72 hours. Sepsis Labs: Recent Labs  Lab 03/02/20 2102  LATICACIDVEN 1.3    Recent Results (from the past 240 hour(s))  Blood culture (routine x 2)     Status: None (Preliminary result)   Collection Time: 03/02/20  9:02 PM   Specimen: BLOOD  Result Value Ref Range Status   Specimen Description   Final    BLOOD LEFT ANTECUBITAL Performed at [facility], [address] [place], [place] [address]    Special Requests   Final    BOTTLES DRAWN AEROBIC AND ANAEROBIC Blood Culture adequate volume Performed at [facility], [address], [place], [place] [address]    Culture PENDING  Incomplete   Report Status PENDING  Incomplete  SARS Coronavirus 2 by RT PCR (hospital order, performed in [facility] hospital lab) Nasopharyngeal Nasopharyngeal Swab     Status: None   Collection Time: 03/02/20  9:02 PM   Specimen: Nasopharyngeal Swab  Result Value Ref Range Status    SARS Coronavirus 2 NEGATIVE NEGATIVE Final    Comment: (NOTE) SARS-CoV-2 target nucleic acids are NOT DETECTED.  The SARS-CoV-2 RNA is generally detectable in upper and lower respiratory specimens during the acute phase of infection. The lowest concentration of SARS-CoV-2 viral copies this assay can detect is 250 copies / mL. A negative result does not preclude SARS-CoV-2 infection and should not be used as the sole basis for treatment or other patient management decisions.  A negative result may occur with improper specimen collection / handling, submission of specimen other than nasopharyngeal swab, presence of viral mutation(s) within the areas targeted by this assay, and inadequate number of viral copies (<250 copies / mL). A negative result must be combined with clinical observations, patient history, and epidemiological information.  Fact Sheet for Patients:   BoilerBrush.com.cy  Fact Sheet for Healthcare Providers: https://pope.com/  This test is not yet approved or  cleared by the [place] FDA and has been authorized for detection and/or diagnosis of SARS-CoV-2 by FDA under an Emergency Use Authorization (EUA).  This EUA will remain in effect (meaning this test can be used) for the duration of the COVID-19 declaration under Section 564(b)(1) of the Act, 21 U.S.C. section 360bbb-3(b)(1), unless the authorization is terminated or revoked sooner.  Performed at [facility], [address], [place], [place] [address]          Radiology Studies: CT ABDOMEN PELVIS W CONTRAST  Result Date: 03/02/2020 CLINICAL DATA:  Abdominal pain, fever EXAM: CT ABDOMEN AND PELVIS WITH CONTRAST TECHNIQUE: Multidetector CT imaging of the abdomen and pelvis was performed using the standard protocol following bolus administration of intravenous contrast. CONTRAST:  OMNIPAQUE IOHEXOL 300 MG/ML  SOLN COMPARISON:  None  FINDINGS: Lower chest: Lung bases are clear. Cardiac size at the upper limits of normal. No pericardial effusion. Hepatobiliary: Hypoattenuating focus in the caudate lobe measuring 1.2 cm, some possible centripetal fill in on delayed phase imaging. Few additional scattered subcentimeter hypertension foci too small to fully characterize on CT imaging but statistically likely benign. Smooth liver surface contour. Hepatic attenuation within expected normals for phase of imaging. Normal gallbladder and biliary tree without visible calcified gallstones. Pancreas: Unremarkable. No pancreatic ductal dilatation or surrounding inflammatory changes. Spleen: Normal in size without focal abnormality. Adrenals/Urinary Tract: There is a 2.2 cm fat attenuation left adrenal nodule arising from the medial limb (2/18) and a more indeterminate 2.6 cm intermediate attenuation lesion arising from the lateral limb/body  without significant washout on postcontrast imaging (2/21). Kidneys enhance and excrete symmetrically. Fluid attenuation cyst measuring 1.7 cm in the interpolar left kidney. Additional subcentimeter hypertension foci too small to fully characterize on CT imaging but statistically likely benign. No worrisome renal lesions. No urolithiasis or hydronephrosis. Urinary bladder is largely decompressed at the time of exam and therefore poorly evaluated by CT imaging. Bladder wall thickening is nonspecific, possibly related to underdistention though could correlate for symptoms cystitis. Stomach/Bowel: Distal esophagus, stomach and duodenum are unremarkable. There are several focally thickened segments of small bowel in the right mid abdomen (5/57 for exam) which appears reactive to the adjacent phlegmon and inflammation centered upon the tip of the appendix with evidence of perforation given small punctate foci of extraluminal gas and free fluid. Much of the colon is otherwise unremarkable aside from a small amount of adjacent  reactive thickening upon the mid sigmoid in a region of several colonic diverticula possibly causing a reactive diverticulitis. Vascular/Lymphatic: Atherosclerotic calcifications within the abdominal aorta and branch vessels. No aneurysm or ectasia. No enlarged abdominopelvic lymph nodes. Reactive adenopathy is present in the mesentery adjacent the appendiceal perforation. Reproductive: Borderline prostatomegaly. Few coarse eccentric calcifications. Seminal vesicles are unremarkable. Other: Perforated appendicitis with surrounding phlegmonous change and few foci of extraluminal gas and trace free fluid. Some associated peritoneal thickening. No other abdominopelvic free air or fluid. No bowel containing hernias. Small fat containing umbilical hernia. Musculoskeletal: No acute osseous abnormality or suspicious osseous lesion. Multilevel degenerative changes are present in the imaged portions of the spine. Stepwise retrolisthesis L1-L3 and grade 1 anterolisthesis L4 on 5. Advanced multilevel discogenic change and facet arthropathy resulting in multilevel moderate to severe foraminal and canal stenoses throughout the lumbar spine. Additional moderate degenerative changes in the hips and pelvis. IMPRESSION: 1. Appendicitis with likely small microperforation with surrounding phlegmonous change and few foci of extraluminal gas and trace free fluid. No organized collection is seen at this time. 2. Several focally thickened segments of adjacent small bowel and the mid sigmoid including several minimally colonic diverticula favored to be reactive/secondary to the appendicitis. 3. Bladder wall thickening is nonspecific, possibly related to underdistention though could correlate for symptoms of cystitis. 4. Left adrenal myelolipoma. Additional indeterminate 2.6 cm intermediate attenuation lesion arising from the left adrenal gland. Could consider further evaluation with nonemergent adrenal protocol CT or MRI. This  recommendation follows ACR consensus guidelines: Management of Incidental Adrenal Masses: A White Paper of the ACR Incidental Findings Committee. J Am Coll Radiol 2017;14:1038-1044. 5. Aortic Atherosclerosis (ICD10-I70.0). Electronically Signed   By: Kreg ShropshirePrice  DeHay M.D.   On: 03/02/2020 21:59    Scheduled Meds: . gabapentin  300 mg Oral TID  . sodium chloride flush  3 mL Intravenous Q12H   Continuous Infusions: . heparin 1,050 Units/hr (03/03/20 2348)  . piperacillin-tazobactam (ZOSYN)  IV 3.375 g (03/04/20 [id number])     LOS: 2 days   Time spent: 40min   Azucena FallenWilliam C Reshanda Lewey, DO [facility]  If 7PM-7AM, please contact night-coverage [email or web address]  03/04/2020, 7:59 AM

## 2020-03-04 NOTE — Progress Notes (Addendum)
[facility] Progress Note     Subjective: CC:   Patient feels he is improving. Pain controlled with tylenol. Has intermitted cramping pains described as gas pain. He reports feeling bloated. He is having small amounts of flatus and loose non-bloody BMs. Tolerating FLD but reports decreased appetite.   Denies fever, chills, night sweats, nausea, vomiting, increased belching, or urinary sxs.   Objective: Vital signs in last 24 hours: Temp:  [98 F (36.7 C)-98.9 F (37.2 C)] 98.9 F (37.2 C) (07/19 0434) Pulse Rate:  [51-91] 91 (07/19 0434) Resp:  [17-20] 17 (07/19 0434) BP: (105-131)/(54-64) 131/64 (07/19 0434) SpO2:  [95 %-97 %] 95 % (07/19 0434) Last BM Date: 03/03/20  Intake/Output from previous day: 07/18 0701 - 07/19 0700 In: 1106.3 [P.O.:840; I.V.:201.3; IV Piggyback:65] Out: -  Intake/Output this shift: Total I/O In: 640 [P.O.:640] Out: -   PE: Gen:  Alert, NAD, pleasant Card:  Regular rate and rhythm, pedal pulses 2+ BL Pulm:  Normal effort, clear to auscultation bilaterally Abd: Soft, palpation of RLQ and LLQ causes radiation of pain to suprapubic region, no guarding. Skin: warm and dry, no rashes  Psych: A&Ox3   Lab Results:  Recent Labs    03/03/20 0701 03/04/20 0740  WBC 16.4* 14.3*  HGB 13.2 13.3  HCT 39.0 39.2  PLT 142* 145*   BMET Recent Labs    03/03/20 0701 03/04/20 0740  NA 136 135  K 3.2* 3.0*  CL 98 95*  CO2 26 27  GLUCOSE 109* 114*  BUN 13 14  CREATININE 0.55* 0.55*  CALCIUM 8.7* 8.7*   PT/INR No results for input(s): LABPROT, INR in the last 72 hours. CMP     Component Value Date/Time   NA 135 03/04/2020 0740   K 3.0 (L) 03/04/2020 0740   CL 95 (L) 03/04/2020 0740   CO2 27 03/04/2020 0740   GLUCOSE 114 (H) 03/04/2020 0740   BUN 14 03/04/2020 0740   CREATININE 0.55 (L) 03/04/2020 0740   CALCIUM 8.7 (L) 03/04/2020 0740   PROT 6.6 03/03/2020 0701   ALBUMIN 3.8 03/03/2020 0701   AST 14 (L) 03/03/2020 0701   ALT  15 03/03/2020 0701   ALKPHOS 35 (L) 03/03/2020 0701   BILITOT 2.1 (H) 03/03/2020 0701   GFRNONAA >60 03/04/2020 0740   GFRAA >60 03/04/2020 0740   Lipase     Component Value Date/Time   LIPASE 20 03/02/2020 1929       Studies/Results: CT ABDOMEN PELVIS W CONTRAST  Result Date: 03/02/2020 CLINICAL DATA:  Abdominal pain, fever EXAM: CT ABDOMEN AND PELVIS WITH CONTRAST TECHNIQUE: Multidetector CT imaging of the abdomen and pelvis was performed using the standard protocol following bolus administration of intravenous contrast. CONTRAST:  OMNIPAQUE IOHEXOL 300 MG/ML  SOLN COMPARISON:  None FINDINGS: Lower chest: Lung bases are clear. Cardiac size at the upper limits of normal. No pericardial effusion. Hepatobiliary: Hypoattenuating focus in the caudate lobe measuring 1.2 cm, some possible centripetal fill in on delayed phase imaging. Few additional scattered subcentimeter hypertension foci too small to fully characterize on CT imaging but statistically likely benign. Smooth liver surface contour. Hepatic attenuation within expected normals for phase of imaging. Normal gallbladder and biliary tree without visible calcified gallstones. Pancreas: Unremarkable. No pancreatic ductal dilatation or surrounding inflammatory changes. Spleen: Normal in size without focal abnormality. Adrenals/Urinary Tract: There is a 2.2 cm fat attenuation left adrenal nodule arising from the medial limb (2/18) and a more indeterminate 2.6 cm intermediate attenuation lesion  arising from the lateral limb/body without significant washout on postcontrast imaging (2/21). Kidneys enhance and excrete symmetrically. Fluid attenuation cyst measuring 1.7 cm in the interpolar left kidney. Additional subcentimeter hypertension foci too small to fully characterize on CT imaging but statistically likely benign. No worrisome renal lesions. No urolithiasis or hydronephrosis. Urinary bladder is largely decompressed at the time of exam and  therefore poorly evaluated by CT imaging. Bladder wall thickening is nonspecific, possibly related to underdistention though could correlate for symptoms cystitis. Stomach/Bowel: Distal esophagus, stomach and duodenum are unremarkable. There are several focally thickened segments of small bowel in the right mid abdomen (5/57 for exam) which appears reactive to the adjacent phlegmon and inflammation centered upon the tip of the appendix with evidence of perforation given small punctate foci of extraluminal gas and free fluid. Much of the colon is otherwise unremarkable aside from a small amount of adjacent reactive thickening upon the mid sigmoid in a region of several colonic diverticula possibly causing a reactive diverticulitis. Vascular/Lymphatic: Atherosclerotic calcifications within the abdominal aorta and branch vessels. No aneurysm or ectasia. No enlarged abdominopelvic lymph nodes. Reactive adenopathy is present in the mesentery adjacent the appendiceal perforation. Reproductive: Borderline prostatomegaly. Few coarse eccentric calcifications. Seminal vesicles are unremarkable. Other: Perforated appendicitis with surrounding phlegmonous change and few foci of extraluminal gas and trace free fluid. Some associated peritoneal thickening. No other abdominopelvic free air or fluid. No bowel containing hernias. Small fat containing umbilical hernia. Musculoskeletal: No acute osseous abnormality or suspicious osseous lesion. Multilevel degenerative changes are present in the imaged portions of the spine. Stepwise retrolisthesis L1-L3 and grade 1 anterolisthesis L4 on 5. Advanced multilevel discogenic change and facet arthropathy resulting in multilevel moderate to severe foraminal and canal stenoses throughout the lumbar spine. Additional moderate degenerative changes in the hips and pelvis. IMPRESSION: 1. Appendicitis with likely small microperforation with surrounding phlegmonous change and few foci of  extraluminal gas and trace free fluid. No organized collection is seen at this time. 2. Several focally thickened segments of adjacent small bowel and the mid sigmoid including several minimally colonic diverticula favored to be reactive/secondary to the appendicitis. 3. Bladder wall thickening is nonspecific, possibly related to underdistention though could correlate for symptoms of cystitis. 4. Left adrenal myelolipoma. Additional indeterminate 2.6 cm intermediate attenuation lesion arising from the left adrenal gland. Could consider further evaluation with nonemergent adrenal protocol CT or MRI. This recommendation follows ACR consensus guidelines: Management of Incidental Adrenal Masses: A White Paper of the ACR Incidental Findings Committee. J Am Coll Radiol 2017;14:1038-1044. 5. Aortic Atherosclerosis (ICD10-I70.0). Electronically Signed   By: Kreg Shropshire M.D.   On: 03/02/2020 21:59    Anti-infectives: Anti-infectives (From admission, onward)   Start     Dose/Rate Route Frequency Ordered Stop   03/03/20 0600  piperacillin-tazobactam (ZOSYN) IVPB 3.375 g     Discontinue     3.375 g 12.5 mL/hr over 240 Minutes Intravenous Every 8 hours 03/02/20 2351     03/02/20 2030  piperacillin-tazobactam (ZOSYN) IVPB 3.375 g        3.375 g 100 mL/hr over 30 Minutes Intravenous  Once 03/02/20 2027 03/02/20 2124     Assessment/Plan A.fib on Eliquis at home - continue to hold Eliquis, heparin gtt  HTN   Perforated appendicitis   - afebrile, VSS, WBC 14 from 18 on admission - pain improving but still with localized peritonitis on exam, continue Full liquid diet  - CBC and BMP in AM - PRN analgesics and anti-emetics  -  may need repeat CT scan in 48 hours   - PRN analgesics and antiemetics  FEN: FLD, ensure, hypokalemia (3.0) replete PO 40 mEq BID and check magnesium level  ID: Zosyn 7/17 >> VTE: SCD's, heparin gtt, continue to hold Eliquis  Foley: none, voiding spontaneously    LOS: 2 days     Hosie Spangle, [facility] Please see Amion for pager number during day hours 7:00am-4:30pm  Agree with above. Seems better.  Ovidio Kin, MD, [facility] Office phone:  [phone]

## 2020-03-04 NOTE — Progress Notes (Signed)
ANTICOAGULATION CONSULT NOTE - Follow Up Consult  Pharmacy Consult for heparin Indication: hx atrial fibrillation (homeEliquis on hold)  Allergies  Allergen Reactions  . Bee Venom Other (See Comments)    Other reaction(s): fainting, decreased BP  . Chromium Other (See Comments)    rash  . Gabapentin Other (See Comments)    Can't take capsules, tablets are tolerated 7/18 pt states allergy is resolved now, can take capsules   . Sulfa Antibiotics Rash    Patient Measurements: Height: 5\' 6"  (167.6 cm) Weight: 82.7 kg (182 lb 5.1 oz) IBW/kg (Calculated) : 63.8 Heparin Dosing Weight: 80 kg  Vital Signs: Temp: 98.9 F (37.2 C) (07/19 0434) Temp Source: Oral (07/19 0434) BP: 131/64 (07/19 0434) Pulse Rate: 91 (07/19 0434)  Labs: Recent Labs    03/02/20 1929 03/02/20 1929 03/03/20 0701 03/03/20 1058 03/03/20 2133 03/04/20 0740  HGB 14.0   < > 13.2  --   --  13.3  HCT 41.5  --  39.0  --   --  39.2  PLT 183  --  142*  --   --  145*  APTT  --   --   --  52* 120* 100*  HEPARINUNFRC  --   --   --  1.36*  --  1.09*  CREATININE 0.76  --  0.55*  --   --  0.55*   < > = values in this interval not displayed.    Estimated Creatinine Clearance: 68.2 mL/min (A) (by C-G formula based on SCr of 0.55 mg/dL (L)).   Medications:  - on Eliquis 5 mg bid (last dose 7/17 at 0830)  Assessment: Patient is an 86 y.o M with hx afib on Eliquis PTA, presented to the ED in 7/17 with c/o abdominal pain.  Abdominal CT showed appendicitis with small microperforation.  He was transitioned to heparin drip on admission.   Today, 03/04/2020: - heparin level is elevated at 1.09 but this is likely from residual effect of Eliquis, aPTT is therapeutic at 100 secs with rate decreased to 1050 units/hr this morning - cbc stable - CCS recom. non-operative management at this time  Goal of Therapy:  Heparin level 0.3-0.7 units/ml aPTT 66-102 seconds Monitor platelets by anticoagulation protocol: Yes    Plan:  - continue heparin drip at 1050 units/hr - recheck aPTT and heparin level at 4PM to ensure levels remain therapeutic before changing to daily monitoring - monitor for s/sx bleeding  Shlok Raz P 03/04/2020,8:19 AM

## 2020-03-05 LAB — HEPARIN LEVEL (UNFRACTIONATED)
Heparin Unfractionated: 0.34 IU/mL (ref 0.30–0.70)
Heparin Unfractionated: 0.28 IU/mL — ABNORMAL LOW (ref 0.30–0.70)

## 2020-03-05 LAB — CBC WITH DIFFERENTIAL/PLATELET
Abs Immature Granulocytes: 0.18 10*3/uL — ABNORMAL HIGH (ref 0.00–0.07)
Basophils Absolute: 0 10*3/uL (ref 0.0–0.1)
Basophils Relative: 0 %
Eosinophils Absolute: 0 10*3/uL (ref 0.0–0.5)
Eosinophils Relative: 0 %
HCT: 40.5 % (ref 39.0–52.0)
Hemoglobin: 13.2 g/dL (ref 13.0–17.0)
Immature Granulocytes: 1 %
Lymphocytes Relative: 10 %
Lymphs Abs: 1.5 10*3/uL (ref 0.7–4.0)
MCH: 31.7 pg (ref 26.0–34.0)
MCHC: 32.6 g/dL (ref 30.0–36.0)
MCV: 97.1 fL (ref 80.0–100.0)
Monocytes Absolute: 0.9 10*3/uL (ref 0.1–1.0)
Monocytes Relative: 6 %
Neutro Abs: 12.6 10*3/uL — ABNORMAL HIGH (ref 1.7–7.7)
Neutrophils Relative %: 83 %
Platelets: 169 10*3/uL (ref 150–400)
RBC: 4.17 MIL/uL — ABNORMAL LOW (ref 4.22–5.81)
RDW: 12.3 % (ref 11.5–15.5)
WBC: 15.2 10*3/uL — ABNORMAL HIGH (ref 4.0–10.5)
nRBC: 0 % (ref 0.0–0.2)

## 2020-03-05 LAB — APTT
aPTT: 62 seconds — ABNORMAL HIGH (ref 24–36)
aPTT: 83 seconds — ABNORMAL HIGH (ref 24–36)

## 2020-03-05 LAB — BASIC METABOLIC PANEL
Anion gap: 9 (ref 5–15)
BUN: 10 mg/dL (ref 8–23)
CO2: 26 mmol/L (ref 22–32)
Calcium: 8.8 mg/dL — ABNORMAL LOW (ref 8.9–10.3)
Chloride: 99 mmol/L (ref 98–111)
Creatinine, Ser: 0.47 mg/dL — ABNORMAL LOW (ref 0.61–1.24)
GFR calc Af Amer: >60 mL/min (ref >60)
GFR calc non Af Amer: >60 mL/min (ref >60)
Glucose, Bld: 109 mg/dL — ABNORMAL HIGH (ref 70–99)
Potassium: 3.7 mmol/L (ref 3.5–5.1)
Sodium: 134 mmol/L — ABNORMAL LOW (ref 135–145)

## 2020-03-05 MED ORDER — SODIUM CHLORIDE 0.9 % IV SOLN
INTRAVENOUS | Status: DC | PRN
  Administered 2020-03-05: 250 mL via INTRAVENOUS

## 2020-03-05 MED ORDER — HEPARIN (PORCINE) 25000 UT/250ML-% IV SOLN
1200.0000 [IU]/h | INTRAVENOUS | Status: DC
  Administered 2020-03-05: 1200 [IU]/h via INTRAVENOUS
  Filled 2020-03-05: qty 250

## 2020-03-05 MED ORDER — FUROSEMIDE 40 MG PO TABS
40.0000 mg | ORAL_TABLET | Freq: Every day | ORAL | Status: DC
  Administered 2020-03-06: 40 mg via ORAL
  Filled 2020-03-05: qty 1

## 2020-03-05 MED ORDER — METOPROLOL TARTRATE 50 MG PO TABS
100.0000 mg | ORAL_TABLET | Freq: Every day | ORAL | Status: DC
  Administered 2020-03-05 – 2020-03-06 (×2): 100 mg via ORAL
  Filled 2020-03-05 (×2): qty 2

## 2020-03-05 MED ORDER — FUROSEMIDE 20 MG PO TABS
20.0000 mg | ORAL_TABLET | ORAL | Status: DC
  Administered 2020-03-05: 20 mg via ORAL
  Filled 2020-03-05: qty 1

## 2020-03-05 MED ORDER — POTASSIUM CHLORIDE 20 MEQ PO PACK
20.0000 meq | PACK | Freq: Once | ORAL | Status: AC
  Administered 2020-03-05: 20 meq via ORAL
  Filled 2020-03-05: qty 1

## 2020-03-05 MED ORDER — LOSARTAN POTASSIUM 50 MG PO TABS
100.0000 mg | ORAL_TABLET | Freq: Every day | ORAL | Status: DC
  Administered 2020-03-05 – 2020-03-06 (×2): 100 mg via ORAL
  Filled 2020-03-05 (×2): qty 2

## 2020-03-05 MED ORDER — MAGNESIUM SULFATE 4 GM/100ML IV SOLN
4.0000 g | Freq: Once | INTRAVENOUS | Status: AC
  Administered 2020-03-05: 4 g via INTRAVENOUS
  Filled 2020-03-05: qty 100

## 2020-03-05 MED ORDER — AMLODIPINE BESYLATE 10 MG PO TABS
10.0000 mg | ORAL_TABLET | Freq: Every day | ORAL | Status: DC
  Administered 2020-03-05 – 2020-03-06 (×2): 10 mg via ORAL
  Filled 2020-03-05 (×2): qty 1

## 2020-03-05 NOTE — Progress Notes (Signed)
Brief Pharmacy Note:  51 y/oM on IV heparin for history of a-fib while PTA Apixaban on hold. Admitted with acute appendicitis with likely small microperforation.     1618 heparin level = 0.28 units/mL, slightly subtherapeutic  1618 aPTT = 83 seconds, therapeutic  CBC: H/H, Pltc WNL  No bleeding or infusion issues per nursing   Plan: Increase heparin infusion slightly to 1200 units/hr Heparin level 8 hours after rate change Stop checking aPTTs Daily CBC, heparin level Monitor closely for s/sx of bleeding   Greer Pickerel, PharmD, BCPS Clinical Pharmacist  03/05/2020 5:35 PM

## 2020-03-05 NOTE — Progress Notes (Signed)
Pharmacy Antibiotic Note  Donald [name] is a 86 y.o. male presented to the ED on 03/02/2020 with c/o abdominal pain.  Abdominal CT showed appendicitis with likely small microperforation. CCS recom. non-operative management at this time.  He's currently on zosyn for perforated appendicitis.  Today, 03/05/2020: - day #3 abx - afeb, wbc 15.2 - scr low but stable - all cultures have been negative thus far  Plan: - continue zosyn 3.375 gm IV q8h (infuse over 4 hrs) - with stable renal function, pharmacy will sign off for abx.  Re-consult [place] if further assistance is needed.  __________________________________  Height: 5\' 6"  (167.6 cm) Weight: 82.7 kg (182 lb 5.1 oz) IBW/kg (Calculated) : 63.8  Temp (24hrs), Avg:98.1 F (36.7 C), Min:97.3 F (36.3 C), Max:98.7 F (37.1 C)  Recent Labs  Lab 03/02/20 1929 03/02/20 2102 03/03/20 0701 03/04/20 0740 03/05/20 0553  WBC 18.3*  --  16.4* 14.3* 15.2*  CREATININE 0.76  --  0.55* 0.55* 0.47*  LATICACIDVEN  --  1.3  --   --   --     Estimated Creatinine Clearance: 68.2 mL/min (A) (by C-G formula based on SCr of 0.47 mg/dL (L)).    Allergies  Allergen Reactions  . Bee Venom Other (See Comments)    Other reaction(s): fainting, decreased BP  . Chromium Other (See Comments)    rash  . Gabapentin Other (See Comments)    Can't take capsules, tablets are tolerated 7/18 pt states allergy is resolved now, can take capsules   . Sulfa Antibiotics Rash    Antimicrobials this admission:  7/17 Zosyn  >>   Microbiology results:  7/17 BCx x2:  7/17 UCx: neg FINAL  Thank you for allowing pharmacy to be a part of this patient's care.  8/17 03/05/2020 8:03 AM

## 2020-03-05 NOTE — Progress Notes (Signed)
ANTICOAGULATION CONSULT NOTE - Follow Up Consult  Pharmacy Consult for heparin Indication: hx atrial fibrillation (home Eliquis on hold)  Allergies  Allergen Reactions  . Bee Venom Other (See Comments)    Other reaction(s): fainting, decreased BP  . Chromium Other (See Comments)    rash  . Gabapentin Other (See Comments)    Can't take capsules, tablets are tolerated 7/18 pt states allergy is resolved now, can take capsules   . Sulfa Antibiotics Rash    Patient Measurements: Height: 5\' 6"  (167.6 cm) Weight: 82.7 kg (182 lb 5.1 oz) IBW/kg (Calculated) : 63.8 Heparin Dosing Weight: 80 kg  Vital Signs: Temp: 97.3 F (36.3 C) (07/20 0500) Temp Source: Oral (07/20 0500) BP: 147/82 (07/20 0500) Pulse Rate: 90 (07/20 0500)  Labs: Recent Labs    03/03/20 0701 03/03/20 1058 03/04/20 0740 03/04/20 1546 03/05/20 0553  HGB 13.2   < > 13.3  --  13.2  HCT 39.0  --  39.2  --  40.5  PLT 142*  --  145*  --  169  APTT  --    < > 100* 82* 62*  HEPARINUNFRC  --    < > 1.09* 0.74* 0.34  CREATININE 0.55*  --  0.55*  --  0.47*   < > = values in this interval not displayed.    Estimated Creatinine Clearance: 68.2 mL/min (A) (by C-G formula based on SCr of 0.47 mg/dL (L)).   Medications:  - on Eliquis 5 mg bid (last dose 7/17 at 0830)  Assessment: Patient is an 86 y.o M with hx afib on Eliquis PTA, presented to the ED in 7/17 with c/o abdominal pain.  Abdominal CT showed appendicitis with small microperforation.  He was transitioned to heparin drip on admission.   Today, 03/05/2020: - heparin level is therapeutic at 0.34, aPTT is slightly sub-therapeutic at 62 secs --> heparin and aPTT levels not correlating so will monitor based on aPTT at this time - cbc stable - no bleeding documented - CCS recom. non-operative management at this time  Goal of Therapy:  Heparin level 0.3-0.7 units/ml aPTT 66-102 seconds Monitor platelets by anticoagulation protocol: Yes   Plan:  -  increase heparin drip to 1150 units/hr - check 8 hr aPTT and heparin levels - monitor for s/sx bleeding  Rekita Miotke P 03/05/2020,7:54 AM

## 2020-03-05 NOTE — Progress Notes (Signed)
PROGRESS NOTE    Donald [name]  RUE:[id number] DOB: Feb 13, 1933 DOA: 03/02/2020 PCP: Daiva NakayamaPa, [facility]   Brief Narrative:  Donald [name] is a [age] y.o. male with medical history significant for atrial fibrillation on Eliquis, hypertension, and chronic diastolic CHF, now presenting to emergency department for evaluation of abdominal pain and fever.  Patient reports he been in his usual state of health until 03/01/2020 when he developed mid abdominal pain and fever.  He was unable to identify any precipitating event for his symptoms but describes moderate to severe pain localized to the mid abdomen and temperature of 100.6 F at home. He took Tylenol at home with improvement in his symptoms, but the pain began to worsen again, has been persistent, and eventually prompted his presentation to the ED.  Patient denies any associated vomiting, diarrhea, melena, or hematochezia.  He had been doing well leading up to this and denies any recent chest pain, cough, or shortness of breath.Upon arrival to the ED, patient is found to be afebrile, saturating well on room air, and with blood pressure 98/53.  EKG features atrial fibrillation with RBBB.  Chemistry panel with mild hyponatremia and CBC with leukocytosis to 18,300.  Lactic acid is reassuringly normal.  COVID-19 screening test was negative.  CT of the abdomen and pelvis is concerning for acute appendicitis with suspected microperforation, and also notable for an incidental left adrenal lesion.  Blood cultures were collected in the emergency department, a liter of saline was administered, and the patient was started on Zosyn.  Surgery was consulted by the ED physician, has evaluated patient in the emergency department, and recommends medical admission.  Assessment & Plan:   Principal Problem:   Acute appendicitis Active Problems:   A-fib (HCC)   Hypertension   Chronic diastolic CHF (congestive heart failure) (HCC)   Hyponatremia   Adrenal nodule  (HCC)   Sepsis in the setting of acute appendicitis with possible microperforation, POA  - Presents with tachypnea, leukocytosis with notable infection on CT concerning for acute appendicitis with possible microperforation  -Surgery following, appreciate insight and recommendations, nonoperative treatment at this time with IV antibiotics and bowel rest.   - Pain currently well controlled on current regimen - Cultures collected at intake, follow for results -currently pending negative - Continue bowel-rest, continue Zosyn, pain-control, gentle IVF hydration -likely require 10 to 14-day course of antibiotics to ensure resolution of any intra-abdominal infection given questionable microperforation. -Possible need for repeat imaging, defer to surgery, patient is at high risk for abscess formation, we had lengthy discussion at bedside today about need for monitoring patient symptoms even after discharge including worsening abdominal pain nausea vomiting fever chills or abdominal distention.  Atrial fibrillation, unspecified, rate controlled - In rate-controlled atrial fibrillation on admission  - CHADS-VASc is at least 184 (age x2, CHF, HTN)  - Hold Eliquis and use IV heparin for now in case patient needs procedure -can transition back to Eliquis at discharge  Essential hypertension - Borderline hypotensive at intake, being with p.o. intake - Resume home medications: Lasix, losartan, amlodipine, metoprolol  Chronic diastolic CHF, not in acute exacerbation  - Monitor I's and O's -Resume Lasix, losartan, metoprolol as above  Incidentally noted left adrenal nodule  - On CT at admission - Outpatient follow-up recommended     DVT prophylaxis: Heparin gtt (perioperatively - on eliquis at home) Code Status: Full Family Communication: None present  Status is: Inpatient  Dispo: The patient is from: Home  Anticipated d/c is to: Home              Anticipated d/c date is: 24 to 48  hours pending clinical course              Patient currently not medically stable for discharge due to ongoing need for IV fluids in the setting of poor p.o. intake and possible surgical procedure given above  Consultants:   Gen Sx  Procedures:   None planned  Antimicrobials:  Zosyn 7/17 --> ongoing   Subjective: No acute issues or events overnight, abdominal pain markedly improving, denies nausea or vomiting this morning.  Otherwise denies chest pain, shortness of breath, headache, fevers, chills.   Objective: Vitals:   03/04/20 1342 03/04/20 2003 03/04/20 2056 03/05/20 0500  BP: (!) 100/98 139/72 (!) 144/63 (!) 147/82  Pulse: 100 (!) 104 83 90  Resp:  18 18 17   Temp: 98 F (36.7 C) 98.2 F (36.8 C) 98.7 F (37.1 C) (!) 97.3 F (36.3 C)  TempSrc: Oral Oral Oral Oral  SpO2: 95% 95% 98% 96%  Weight:      Height:        Intake/Output Summary (Last 24 hours) at 03/05/2020 0816 Last data filed at 03/04/2020 2111 Gross per 24 hour  Intake 1341.84 ml  Output --  Net 1341.84 ml   Filed Weights   03/02/20 1916 03/03/20 0500  Weight: 82.6 kg 82.7 kg    Examination:  General exam: Appears calm and comfortable  Respiratory system: Clear to auscultation. Respiratory effort normal. Cardiovascular system: S1 & S2 heard, RRR. No JVD, murmurs, rubs, gallops or clicks. No pedal edema. Gastrointestinal system: Nondistended, soft, nontender. Central nervous system: Alert and oriented. No focal neurological deficits. Extremities: Symmetric 5 x 5 power. Skin: No rashes, lesions or ulcers Psychiatry: Judgement and insight appear normal. Mood & affect appropriate.    Data Reviewed: I have personally reviewed following labs and imaging studies  CBC: Recent Labs  Lab 03/02/20 1929 03/03/20 0701 03/04/20 0740 03/05/20 0553  WBC 18.3* 16.4* 14.3* 15.2*  NEUTROABS  --  13.8* 12.4* 12.6*  HGB 14.0 13.2 13.3 13.2  HCT 41.5 39.0 39.2 40.5  MCV 95.0 94.4 95.8 97.1  PLT 183 142*  145* 169   Basic Metabolic Panel: Recent Labs  Lab 03/02/20 1929 03/03/20 0701 03/04/20 0740 03/05/20 0553  NA 132* 136 135 134*  K 3.5 3.2* 3.0* 3.7  CL 94* 98 95* 99  CO2 27 26 27 26   GLUCOSE 152* 109* 114* 109*  BUN 13 13 14 10   CREATININE 0.76 0.55* 0.55* 0.47*  CALCIUM 9.1 8.7* 8.7* 8.8*  MG  --  1.6* 1.5*  --    GFR: Estimated Creatinine Clearance: 68.2 mL/min (A) (by C-G formula based on SCr of 0.47 mg/dL (L)). Liver Function Tests: Recent Labs  Lab 03/02/20 1929 03/03/20 0701  AST 18 14*  ALT 19 15  ALKPHOS 42 35*  BILITOT 1.7* 2.1*  PROT 7.6 6.6  ALBUMIN 4.4 3.8   Recent Labs  Lab 03/02/20 1929  LIPASE 20   No results for input(s): AMMONIA in the last 168 hours. Coagulation Profile: No results for input(s): INR, PROTIME in the last 168 hours. Cardiac Enzymes: No results for input(s): CKTOTAL, CKMB, CKMBINDEX, TROPONINI in the last 168 hours. BNP (last 3 results) No results for input(s): PROBNP in the last 8760 hours. HbA1C: No results for input(s): HGBA1C in the last 72 hours. CBG: No results for input(s): GLUCAP in the  last 168 hours. Lipid Profile: No results for input(s): CHOL, HDL, LDLCALC, TRIG, CHOLHDL, LDLDIRECT in the last 72 hours. Thyroid Function Tests: No results for input(s): TSH, T4TOTAL, FREET4, T3FREE, THYROIDAB in the last 72 hours. Anemia Panel: No results for input(s): VITAMINB12, FOLATE, FERRITIN, TIBC, IRON, RETICCTPCT in the last 72 hours. Sepsis Labs: Recent Labs  Lab 03/02/20 2102  LATICACIDVEN 1.3    Recent Results (from the past 240 hour(s))  Urine culture     Status: None   Collection Time: 03/02/20  8:28 PM   Specimen: Urine, Random  Result Value Ref Range Status   Specimen Description   Final    URINE, RANDOM Performed at [facility], [address], [place], [place] [address]    Special Requests   Final    NONE Performed at [facility], [address]  [place], [place] [address]    Culture   Final    NO GROWTH Performed at [facility], [address] [place], [place] [address]    Report Status 03/04/2020 FINAL  Final  Blood culture (routine x 2)     Status: None (Preliminary result)   Collection Time: 03/02/20  9:02 PM   Specimen: BLOOD  Result Value Ref Range Status   Specimen Description   Final    BLOOD LEFT ANTECUBITAL Performed at [facility], [address] [place], [place] [address]    Special Requests   Final    BOTTLES DRAWN AEROBIC AND ANAEROBIC Blood Culture adequate volume Performed at [facility], [address] [place], [place] [address]    Culture   Final    NO GROWTH 2 DAYS Performed at [facility], [address] [place], [place] [address]    Report Status PENDING  Incomplete  Blood culture (routine x 2)     Status: None (Preliminary result)   Collection Time: 03/02/20  9:02 PM   Specimen: BLOOD RIGHT FOREARM  Result Value Ref Range Status   Specimen Description   Final    BLOOD RIGHT FOREARM Performed at [facility], [address], [place], [place] [address]    Special Requests   Final    BOTTLES DRAWN AEROBIC AND ANAEROBIC Blood Culture adequate volume Performed at [facility], [address] [place], [place] [address]    Culture   Final    NO GROWTH 2 DAYS Performed at [facility], [address] [place], [place] [address]    Report Status PENDING  Incomplete  SARS Coronavirus 2 by RT PCR (hospital order, performed in [facility] hospital lab) Nasopharyngeal Nasopharyngeal Swab     Status: None   Collection Time: 03/02/20  9:02 PM   Specimen: Nasopharyngeal Swab  Result Value Ref Range Status   SARS Coronavirus 2 NEGATIVE NEGATIVE Final    Comment: (NOTE) SARS-CoV-2 target nucleic acids are NOT DETECTED.  The SARS-CoV-2 RNA is generally detectable in upper and lower respiratory specimens during the acute  phase of infection. The lowest concentration of SARS-CoV-2 viral copies this assay can detect is 250 copies / mL. A negative result does not preclude SARS-CoV-2 infection and should not be used as the sole basis for treatment or other patient management decisions.  A negative result may occur with improper specimen collection / handling, submission of specimen other than nasopharyngeal swab, presence of viral mutation(s) within the areas targeted by this assay, and inadequate number of viral  copies (<250 copies / mL). A negative result must be combined with clinical observations, patient history, and epidemiological information.  Fact Sheet for Patients:   BoilerBrush.com.cy  Fact Sheet for Healthcare Providers: https://pope.com/  This test is not yet approved or  cleared by the [place] FDA and has been authorized for detection and/or diagnosis of SARS-CoV-2 by FDA under an Emergency Use Authorization (EUA).  This EUA will remain in effect (meaning this test can be used) for the duration of the COVID-19 declaration under Section 564(b)(1) of the Act, 21 U.S.C. section 360bbb-3(b)(1), unless the authorization is terminated or revoked sooner.  Performed at [facility], [address], [place], [place] [address]          Radiology Studies: No results found.  Scheduled Meds: . gabapentin  300 mg Oral TID  . sodium chloride flush  3 mL Intravenous Q12H   Continuous Infusions: . heparin 1,050 Units/hr (03/04/20 2341)  . piperacillin-tazobactam (ZOSYN)  IV 3.375 g (03/05/20 0536)     LOS: 3 days   Time spent:   Azucena Fallen, DO [facility]  If 7PM-7AM, please contact night-coverage [email or web address]  03/05/2020, 8:16 AM

## 2020-03-05 NOTE — Progress Notes (Addendum)
[facility] Progress Note     Subjective: CC:   Pain improving. Having multiple, liquid, non-bloody BMs daily. Increasing flatus which he states is improving his abdominal distention. Denies fever, chills, nausea, vomiting. Is ambulating to bathroom independently and plans to walk in the hall today.   Objective: Vital signs in last 24 hours: Temp:  [97.3 F (36.3 C)-98.7 F (37.1 C)] 97.3 F (36.3 C) (07/20 0500) Pulse Rate:  [83-104] 90 (07/20 0500) Resp:  [17-18] 17 (07/20 0500) BP: (100-147)/(63-98) 147/82 (07/20 0500) SpO2:  [95 %-98 %] 96 % (07/20 0500) Last BM Date: 03/04/20  Intake/Output from previous day: 07/19 0701 - 07/20 0700 In: 1341.8 [P.O.:1050; I.V.:123.7; IV Piggyback:168.1] Out: -  Intake/Output this shift: No intake/output data recorded.  PE: Gen:  Alert, NAD, pleasant Card:  Regular rate and rhythm, pedal pulses 2+ BL Pulm:  Normal effort, clear to auscultation bilaterally Abd: Soft, overall non-tender, minimal suprapubic tenderness, no guarding or rebound tenderness Psych: A&Ox3   Lab Results:  Recent Labs    03/04/20 0740 03/05/20 0553  WBC 14.3* 15.2*  HGB 13.3 13.2  HCT 39.2 40.5  PLT 145* 169   BMET Recent Labs    03/04/20 0740 03/05/20 0553  NA 135 134*  K 3.0* 3.7  CL 95* 99  CO2 27 26  GLUCOSE 114* 109*  BUN 14 10  CREATININE 0.55* 0.47*  CALCIUM 8.7* 8.8*   PT/INR No results for input(s): LABPROT, INR in the last 72 hours. CMP     Component Value Date/Time   NA 134 (L) 03/05/2020 0553   K 3.7 03/05/2020 0553   CL 99 03/05/2020 0553   CO2 26 03/05/2020 0553   GLUCOSE 109 (H) 03/05/2020 0553   BUN 10 03/05/2020 0553   CREATININE 0.47 (L) 03/05/2020 0553   CALCIUM 8.8 (L) 03/05/2020 0553   PROT 6.6 03/03/2020 0701   ALBUMIN 3.8 03/03/2020 0701   AST 14 (L) 03/03/2020 0701   ALT 15 03/03/2020 0701   ALKPHOS 35 (L) 03/03/2020 0701   BILITOT 2.1 (H) 03/03/2020 0701   GFRNONAA >60 03/05/2020 0553   GFRAA  >60 03/05/2020 0553   Lipase     Component Value Date/Time   LIPASE 20 03/02/2020 1929   Studies/Results: No results found.  Anti-infectives: Anti-infectives (From admission, onward)   Start     Dose/Rate Route Frequency Ordered Stop   03/03/20 0600  piperacillin-tazobactam (ZOSYN) IVPB 3.375 g     Discontinue     3.375 g 12.5 mL/hr over 240 Minutes Intravenous Every 8 hours 03/02/20 2351     03/02/20 2030  piperacillin-tazobactam (ZOSYN) IVPB 3.375 g        3.375 g 100 mL/hr over 30 Minutes Intravenous  Once 03/02/20 2027 03/02/20 2124     Assessment/Plan A.fib on Eliquis at home - continue to hold Eliquis, heparin gtt  HTN   Perforated appendicitis   - afebrile, VSS, WBC 15 from 14 - monitor  - pain improving, having bowel function, advance to SOFT diet - CBC and BMP in AM - PRN analgesics and anti-emetics  - may need repeat CT scan for increasing leukocytosis or if clinically worsens (for example increasing pain, nausea, distention) - PRN analgesics and antiemetics  FEN: advance to SOFT diet, hypomagnesemia (1.5) replete  ID: Zosyn 7/17 >> VTE: SCD's, heparin gtt, continue to hold Eliquis  Foley: none, voiding spontaneously   Anticipate the patient will be stable for discharge in 24-48 hours if he continues to improve.  LOS: 3 days   Hosie Spangle, [facility] Surgery Please see Amion for pager number during day hours 7:00am-4:30pm  Agree with above. Looks good.  No reason to repeat CT scan at this time.  Ovidio Kin, MD, [facility] Surgery Office phone:  [phone]

## 2020-03-06 DIAGNOSIS — A419 Sepsis, unspecified organism: Principal | ICD-10-CM

## 2020-03-06 LAB — CBC WITH DIFFERENTIAL/PLATELET
Abs Immature Granulocytes: 0.07 10*3/uL (ref 0.00–0.07)
Basophils Absolute: 0 10*3/uL (ref 0.0–0.1)
Basophils Relative: 0 %
Eosinophils Absolute: 0.1 10*3/uL (ref 0.0–0.5)
Eosinophils Relative: 1 %
HCT: 36.4 % — ABNORMAL LOW (ref 39.0–52.0)
Hemoglobin: 11.9 g/dL — ABNORMAL LOW (ref 13.0–17.0)
Immature Granulocytes: 1 %
Lymphocytes Relative: 16 %
Lymphs Abs: 1.5 10*3/uL (ref 0.7–4.0)
MCH: 32 pg (ref 26.0–34.0)
MCHC: 32.7 g/dL (ref 30.0–36.0)
MCV: 97.8 fL (ref 80.0–100.0)
Monocytes Absolute: 0.7 10*3/uL (ref 0.1–1.0)
Monocytes Relative: 8 %
Neutro Abs: 7.1 10*3/uL (ref 1.7–7.7)
Neutrophils Relative %: 74 %
Platelets: 160 10*3/uL (ref 150–400)
RBC: 3.72 MIL/uL — ABNORMAL LOW (ref 4.22–5.81)
RDW: 12.3 % (ref 11.5–15.5)
WBC: 9.5 10*3/uL (ref 4.0–10.5)
nRBC: 0 % (ref 0.0–0.2)

## 2020-03-06 LAB — BASIC METABOLIC PANEL
Anion gap: 11 (ref 5–15)
BUN: 12 mg/dL (ref 8–23)
CO2: 26 mmol/L (ref 22–32)
Calcium: 8.9 mg/dL (ref 8.9–10.3)
Chloride: 99 mmol/L (ref 98–111)
Creatinine, Ser: 0.53 mg/dL — ABNORMAL LOW (ref 0.61–1.24)
GFR calc Af Amer: >60 mL/min (ref >60)
GFR calc non Af Amer: >60 mL/min (ref >60)
Glucose, Bld: 126 mg/dL — ABNORMAL HIGH (ref 70–99)
Potassium: 3.8 mmol/L (ref 3.5–5.1)
Sodium: 136 mmol/L (ref 135–145)

## 2020-03-06 LAB — HEPARIN LEVEL (UNFRACTIONATED): Heparin Unfractionated: 0.24 IU/mL — ABNORMAL LOW (ref 0.30–0.70)

## 2020-03-06 MED ORDER — AMOXICILLIN-POT CLAVULANATE 875-125 MG PO TABS: 1.0000 | ORAL_TABLET | Freq: Two times a day (BID) | ORAL | 0 refills | Status: AC

## 2020-03-06 MED ORDER — APIXABAN 5 MG PO TABS
5.0000 mg | ORAL_TABLET | Freq: Two times a day (BID) | ORAL | Status: DC
  Administered 2020-03-06: 5 mg via ORAL
  Filled 2020-03-06: qty 1

## 2020-03-06 MED ORDER — HEPARIN (PORCINE) 25000 UT/250ML-% IV SOLN
1400.0000 [IU]/h | INTRAVENOUS | Status: AC
  Administered 2020-03-06: 1400 [IU]/h via INTRAVENOUS

## 2020-03-06 NOTE — Discharge Summary (Signed)
Donald [name] MWN:[id number] DOB: [birth year] DOA: 03/02/2020  PCP: [facility]  Admit date: 03/02/2020 Discharge date: 03/06/2020  Admitted From: Home Disposition: Home  Recommendations for Outpatient Follow-up:  1. Follow up with PCP in 1-2 weeks.  2. Surgery follow up arranged with Dr. Davina Poke, [facility] 3. Augmentin to continue for additional  10 days Please follow-up left adrenal nodule incidentally noted on CT abdomen   Discharge Condition: Stable    Brief/Interim Summary: History of present illness:  Donald [name] is a 86 y.o. year old male with medical history significant for A. fib on Eliquis, HTN, chronic diastolic CHF who presented on 11/17/4740 with worsening abdominal pain and fever at home with minimal improvement on Tylenol and was found to have acute appendicitis with microperforation.  In the ED patient was afebrile with initial blood pressure 98/53 and no lactic acidosis.  Covid test was negative.  Lipase was 20.  Sodium 132, WBC 18.3.  CT abdomen showed appendicitis with small microperforations and no organized collection.  Hospital course: She was treated empirically and managed medically with IV antibiotics as recommended by surgery due to his multiple comorbidities.  Patient's abdominal pain improved on that regimen.  Prior to discharge patient was transitioned to oral Augmentin.   Hospital Course:   Sepsis secondary to acute appendicitis with small microperforations.  Confirmed appendicitis with microperforations on CT abdomen.  Has elements of tachypnea, tachycardia, leukocytosis consistent with sepsis given no infection.  Patient's symptoms improved on IV Zosyn and IV fluids.  Prior to discharge patient was able to tolerate oral diet, and ambulate.  Patient's leukocytosis resolved on antibiotic regimen and patient remained afebrile -Transition to Augmentin to continue for additional 10 days -Has outpatient surgery follow-up  Atrial  fibrillation, rate controlled -His Eliquis was held during hospital stay and monitor on IV heparin in case of need for procedure. -Can resume home Eliquis on discharge.  HTN.  Initially on admission blood pressure in the low 90s like related to diminished oral intake. -Tolerated his home BP medications during hospital course, no changes, continue home regimen  Chronic diastolic CHF, stable.  Normal volume status throughout hospital course and on discharge -Continue home Lasix, losartan  Incidentally noted left adrenal nodule on CT abdomen -Need outpatient follow-up  Hyponatremia, hypovolemic.  In setting of diminished oral intake related to acute appendicitis.  Resolved with IV fluids   Consultations:  Surgery  Procedures/Studies: None Subjective: Feels well.  Ambulating well.  Tolerating diet.  Ready to go home. Discharge Exam: Vitals:   03/05/20 2040 03/06/20 0527  BP: (!) 125/55 131/73  Pulse: (!) 59 67  Resp: 18 18  Temp: 98.8 F (37.1 C) 98 F (36.7 C)  SpO2: 96% 95%   Vitals:   03/05/20 1349 03/05/20 1717 03/05/20 2040 03/06/20 0527  BP: (!) 160/61 (!) 143/82 (!) 125/55 131/73  Pulse: (!) 107 90 (!) 59 67  Resp: 17  18 18   Temp: 98.3 F (36.8 C)  98.8 F (37.1 C) 98 F (36.7 C)  TempSrc: Oral  Oral Oral  SpO2: 97% 97% 96% 95%  Weight:      Height:        General: Lying in bed, no apparent distress Eyes: EOMI, anicteric ENT: Oral Mucosa clear and moist Cardiovascular: regular rate and rhythm, no murmurs, rubs or gallops, no edema, Respiratory: Normal respiratory effort on room air, lungs clear to auscultation bilaterally Abdomen: soft, non-distended, non-tender, normal bowel sounds Skin: No Rash Neurologic: Grossly no focal  neuro deficit.Mental status AAOx3, speech normal, Psychiatric:Appropriate affect, and mood  Discharge Diagnoses:  Principal Problem:   Acute appendicitis Active Problems:   A-fib (HCC)   Hypertension   Chronic diastolic CHF  (congestive heart failure) (HCC)   Hyponatremia   Adrenal nodule [facility])    Discharge Instructions  Discharge Instructions    Diet - low sodium heart healthy   Complete by: As directed    Increase activity slowly   Complete by: As directed      Allergies as of 03/06/2020      Reactions   Bee Venom Other (See Comments)   Other reaction(s): fainting, decreased BP   Chromium Other (See Comments)   rash   Gabapentin Other (See Comments)   Can't take capsules, tablets are tolerated 7/18 pt states allergy is resolved now, can take capsules    Sulfa Antibiotics Rash      Medication List    TAKE these medications   amLODipine 10 MG tablet Commonly known as: NORVASC Take 10 mg by mouth daily.   amoxicillin-clavulanate 875-125 MG tablet Commonly known as: Augmentin Take 1 tablet by mouth 2 (two) times daily for 10 days.   aspirin EC 81 MG tablet Take 81 mg by mouth daily.   atorvastatin 40 MG tablet Commonly known as: LIPITOR Take 40 mg by mouth daily.   cholecalciferol 25 MCG (1000 UNIT) tablet Commonly known as: VITAMIN D3 Take 1,000 Units by mouth daily.   Eliquis 5 MG Tabs tablet Generic drug: apixaban Take 5 mg by mouth 2 (two) times daily.   furosemide 40 MG tablet Commonly known as: LASIX Take 40 mg by mouth See admin instructions. Takes 1 tablet in the morning 1/2 tablet at 2 pm then another 1/2 tablet at 6 pm   gabapentin 300 MG capsule Commonly known as: NEURONTIN Take 300 mg by mouth 3 (three) times daily.   losartan 100 MG tablet Commonly known as: COZAAR Take 100 mg by mouth daily.   metoprolol tartrate 100 MG tablet Commonly known as: LOPRESSOR Take 100 mg by mouth daily.   Vitamin C 500 MG Caps Take 500 capsules by mouth daily.       Follow-up Information    Cornett, Maisie Fus, MD Follow up.   Specialty: General Surgery Why: our office is scheduling you for follow up in 2-4 weeks. please call to confirm appointment date/time. Contact  information: [address] [place] [address] [phone]              Allergies  Allergen Reactions  . Bee Venom Other (See Comments)    Other reaction(s): fainting, decreased BP  . Chromium Other (See Comments)    rash  . Gabapentin Other (See Comments)    Can't take capsules, tablets are tolerated 7/18 pt states allergy is resolved now, can take capsules   . Sulfa Antibiotics Rash        The results of significant diagnostics from this hospitalization (including imaging, microbiology, ancillary and laboratory) are listed below for reference.     Microbiology: Recent Results (from the past 240 hour(s))  Urine culture     Status: None   Collection Time: 03/02/20  8:28 PM   Specimen: Urine, Random  Result Value Ref Range Status   Specimen Description   Final    URINE, RANDOM Performed at [facility], [address], [place], [place] [address]    Special Requests   Final    NONE Performed at [facility], [address], [place], [place] [address]    Culture   Final    NO GROWTH Performed at [facility], [address], [place], [place] [address]    Report Status 03/04/2020 FINAL  Final  Blood culture (routine x 2)     Status: None (Preliminary result)   Collection Time: 03/02/20  9:02 PM   Specimen: BLOOD  Result Value Ref Range Status   Specimen Description   Final    BLOOD LEFT ANTECUBITAL Performed at [facility], [address] [place], [place] [address]    Special Requests   Final    BOTTLES DRAWN AEROBIC AND ANAEROBIC Blood Culture adequate volume Performed at [facility], [address], [place], [place] [address]    Culture   Final    NO GROWTH 3 DAYS Performed at [facility], [address], [place], [place] [address]    Report Status PENDING  Incomplete  Blood culture (routine x 2)     Status: None (Preliminary result)   Collection Time:  03/02/20  9:02 PM   Specimen: BLOOD RIGHT FOREARM  Result Value Ref Range Status   Specimen Description   Final    BLOOD RIGHT FOREARM Performed at [facility], [address] [place], [place] [address]    Special Requests   Final    BOTTLES DRAWN AEROBIC AND ANAEROBIC Blood Culture adequate volume Performed at [facility], [address] [place], [place] [address]    Culture   Final    NO GROWTH 3 DAYS Performed at [facility], [address], [place], [place] [address]    Report Status PENDING  Incomplete  SARS Coronavirus 2 by RT PCR (hospital order, performed in [facility] hospital lab) Nasopharyngeal Nasopharyngeal Swab     Status: None   Collection Time: 03/02/20  9:02 PM   Specimen: Nasopharyngeal Swab  Result Value Ref Range Status   SARS Coronavirus 2 NEGATIVE NEGATIVE Final    Comment: (NOTE) SARS-CoV-2 target nucleic acids are NOT DETECTED.  The SARS-CoV-2 RNA is generally detectable in upper and lower respiratory specimens during the acute phase of infection. The lowest concentration of SARS-CoV-2 viral copies this assay can detect is 250 copies / mL. A negative result does not preclude SARS-CoV-2 infection and should not be used as the sole basis for treatment or other patient management decisions.  A negative result may occur with improper specimen collection / handling, submission of specimen other than nasopharyngeal swab, presence of viral mutation(s) within the areas targeted by this assay, and inadequate number of viral copies (<250 copies / mL). A negative result must be combined with clinical observations, patient history, and epidemiological information.  Fact Sheet for Patients:   BoilerBrush.com.cy  Fact Sheet for Healthcare Providers: https://pope.com/  This test is not yet approved or  cleared by the [place] FDA and has been authorized for  detection and/or diagnosis of SARS-CoV-2 by FDA under an Emergency Use Authorization (EUA).  This EUA will remain in effect (meaning this test can be used) for the duration of the COVID-19 declaration under Section 564(b)(1) of the Act, 21 U.S.C. section 360bbb-3(b)(1), unless the authorization is terminated or revoked sooner.  Performed at [facility], [address], [place], [place] [address]      Labs: BNP (last 3 results) No results for input(s): BNP in the last 8760 hours. Basic Metabolic Panel: Recent  Labs  Lab 03/02/20 1929 03/03/20 0701 03/04/20 0740 03/05/20 0553 03/06/20 0204  NA 132* 136 135 134* 136  K 3.5 3.2* 3.0* 3.7 3.8  CL 94* 98 95* 99 99  CO2 27 26 27 26 26   GLUCOSE 152* 109* 114* 109* 126*  BUN 13 13 14 10 12   CREATININE 0.76 0.55* 0.55* 0.47* 0.53*  CALCIUM 9.1 8.7* 8.7* 8.8* 8.9  MG  --  1.6* 1.5*  --   --    Liver Function Tests: Recent Labs  Lab 03/02/20 1929 03/03/20 0701  AST 18 14*  ALT 19 15  ALKPHOS 42 35*  BILITOT 1.7* 2.1*  PROT 7.6 6.6  ALBUMIN 4.4 3.8   Recent Labs  Lab 03/02/20 1929  LIPASE 20   No results for input(s): AMMONIA in the last 168 hours. CBC: Recent Labs  Lab 03/02/20 1929 03/03/20 0701 03/04/20 0740 03/05/20 0553 03/06/20 0204  WBC 18.3* 16.4* 14.3* 15.2* 9.5  NEUTROABS  --  13.8* 12.4* 12.6* 7.1  HGB 14.0 13.2 13.3 13.2 11.9*  HCT 41.5 39.0 39.2 40.5 36.4*  MCV 95.0 94.4 95.8 97.1 97.8  PLT 183 142* 145* 169 160   Cardiac Enzymes: No results for input(s): CKTOTAL, CKMB, CKMBINDEX, TROPONINI in the last 168 hours. BNP: Invalid input(s): POCBNP CBG: No results for input(s): GLUCAP in the last 168 hours. D-Dimer No results for input(s): DDIMER in the last 72 hours. Hgb A1c No results for input(s): HGBA1C in the last 72 hours. Lipid Profile No results for input(s): CHOL, HDL, LDLCALC, TRIG, CHOLHDL, LDLDIRECT in the last 72 hours. Thyroid function studies No results for  input(s): TSH, T4TOTAL, T3FREE, THYROIDAB in the last 72 hours.  Invalid input(s): FREET3 Anemia work up No results for input(s): VITAMINB12, FOLATE, FERRITIN, TIBC, IRON, RETICCTPCT in the last 72 hours. Urinalysis    Component Value Date/Time   COLORURINE AMBER (A) 03/02/2020 1929   APPEARANCEUR CLEAR 03/02/2020 1929   LABSPEC 1.020 03/02/2020 1929   PHURINE 5.0 03/02/2020 1929   GLUCOSEU NEGATIVE 03/02/2020 1929   HGBUR NEGATIVE 03/02/2020 1929   BILIRUBINUR NEGATIVE 03/02/2020 1929   KETONESUR 5 (A) 03/02/2020 1929   PROTEINUR 30 (A) 03/02/2020 1929   NITRITE NEGATIVE 03/02/2020 1929   LEUKOCYTESUR NEGATIVE 03/02/2020 1929   Sepsis Labs Invalid input(s): PROCALCITONIN,  WBC,  LACTICIDVEN Microbiology Recent Results (from the past 240 hour(s))  Urine culture     Status: None   Collection Time: 03/02/20  8:28 PM   Specimen: Urine, Random  Result Value Ref Range Status   Specimen Description   Final    URINE, RANDOM Performed at [facility], [address] [place], [place]    Special Requests   Final    NONE Performed at [facility], [address] [place], [place]    Culture   Final    NO GROWTH Performed at [facility], [address], [place], [address] [place]    Report Status 03/04/2020 FINAL  Final  Blood culture (routine x 2)     Status: None (Preliminary result)   Collection Time: 03/02/20  9:02 PM   Specimen: BLOOD  Result Value Ref Range Status   Specimen Description   Final    BLOOD LEFT ANTECUBITAL Performed at [facility], [address], [place], [address] [place]    Special Requests   Final    BOTTLES DRAWN AEROBIC AND ANAEROBIC Blood Culture adequate volume Performed at [facility], [address] [place], [place] [address]    Culture   Final    NO GROWTH 3 DAYS Performed at [facility], [address] [place], [place] [address]    Report  Status PENDING  Incomplete  Blood culture (routine x 2)     Status: None (Preliminary result)   Collection Time: 03/02/20  9:02 PM   Specimen: BLOOD RIGHT FOREARM  Result Value Ref Range Status   Specimen Description   Final    BLOOD RIGHT FOREARM Performed at [facility], [address] [place], [place] [address]    Special Requests   Final    BOTTLES DRAWN AEROBIC AND ANAEROBIC Blood Culture adequate volume Performed at [facility], [address] [place], [place] [address]    Culture   Final    NO GROWTH 3 DAYS Performed at [facility], [address] [place], [place] [address]    Report Status PENDING  Incomplete  SARS Coronavirus 2 by RT PCR (hospital order, performed in [facility] lab) Nasopharyngeal Nasopharyngeal Swab     Status: None   Collection Time: 03/02/20  9:02 PM   Specimen: Nasopharyngeal Swab  Result Value Ref Range Status   SARS Coronavirus 2 NEGATIVE NEGATIVE Final    Comment: (NOTE) SARS-CoV-2 target nucleic acids are NOT DETECTED.  The SARS-CoV-2 RNA is generally detectable in upper and lower respiratory specimens during the acute phase of infection. The lowest concentration of SARS-CoV-2 viral copies this assay can detect is 250 copies / mL. A negative result does not preclude SARS-CoV-2 infection and should not be used as the sole basis for treatment or other patient management decisions.  A negative result may occur with improper specimen collection / handling, submission of specimen other than nasopharyngeal swab, presence of viral mutation(s) within the areas targeted by this assay, and inadequate number of viral copies (<250 copies / mL). A negative result must be combined with clinical observations, patient history, and epidemiological information.  Fact Sheet for Patients:   BoilerBrush.com.cyhttps://www.fda.gov/media/136312/download  Fact Sheet for Healthcare  Providers: https://pope.com/https://www.fda.gov/media/136313/download  This test is not yet approved or  cleared by the [place] FDA and has been authorized for detection and/or diagnosis of SARS-CoV-2 by FDA under an Emergency Use Authorization (EUA).  This EUA will remain in effect (meaning this test can be used) for the duration of the COVID-19 declaration under Section 564(b)(1) of the Act, 21 U.S.C. section 360bbb-3(b)(1), unless the authorization is terminated or revoked sooner.  Performed at [facility], [address] [place], [place] [address]      Time coordinating discharge: Over 30 minutes  SIGNED:   Laverna PeaceShayla D Roselynn Whitacre, MD  [facility] 03/06/2020, 9:25 AM Pager   If 7PM-7AM, please contact night-coverage [email or web address] Password [email or web address]

## 2020-03-06 NOTE — Progress Notes (Signed)
ANTICOAGULATION CONSULT NOTE - Follow Up Consult  Pharmacy Consult for heparin Indication: hx atrial fibrillation (home Eliquis on hold)  Allergies  Allergen Reactions  . Bee Venom Other (See Comments)    Other reaction(s): fainting, decreased BP  . Chromium Other (See Comments)    rash  . Gabapentin Other (See Comments)    Can't take capsules, tablets are tolerated 7/18 pt states allergy is resolved now, can take capsules   . Sulfa Antibiotics Rash   Patient Measurements: Height: 5\' 6"  (167.6 cm) Weight: 82.7 kg (182 lb 5.1 oz) IBW/kg (Calculated) : 63.8 Heparin Dosing Weight: 80 kg  Vital Signs: Temp: 98.8 F (37.1 C) (07/20 2040) Temp Source: Oral (07/20 2040) BP: 125/55 (07/20 2040) Pulse Rate: 59 (07/20 2040)  Labs: Recent Labs    03/04/20 0740 03/04/20 0740 03/04/20 1546 03/04/20 1546 03/05/20 0553 03/05/20 1618 03/06/20 0204  HGB 13.3   < >  --   --  13.2  --  11.9*  HCT 39.2  --   --   --  40.5  --  36.4*  PLT 145*  --   --   --  169  --  160  APTT 100*   < > 82*  --  62* 83*  --   HEPARINUNFRC 1.09*   < > 0.74*   < > 0.34 0.28* 0.24*  CREATININE 0.55*  --   --   --  0.47*  --  0.53*   < > = values in this interval not displayed.    Estimated Creatinine Clearance: 68.2 mL/min (A) (by C-G formula based on SCr of 0.53 mg/dL (L)).   Medications:  - on Eliquis 5 mg bid (last dose 7/17 at 0830)  Assessment: Patient is an 86 y.o M with hx afib on Eliquis PTA, presented to the ED in 7/17 with c/o abdominal pain.  Abdominal CT showed appendicitis with small microperforation.  He was transitioned to heparin drip on admission.  03/05/20 - Hep level therapeutic at 0.34, aPTT is slightly sub-therapeutic at 62 secs --> heparin and aPTT levels not correlating so will monitor based on aPTT at this time - increase heparin drip to 1150 units/hr - Follow up Hep level 0.28, aPTT 83 sec - Increase heparin infusion slightly to 1200 units/hr - Stop checking aPTTs - [facility]  recommending non-operative management at this time  Today, 03/06/2020: 0200 Hep level 0.24, remains below therapeutic range Hgb sl decreased, Plt wnl  Goal of Therapy:  Heparin level 0.3-0.7 units/ml aPTT 66-102 seconds Monitor platelets by anticoagulation protocol: Yes   Plan:  Increase Heparin to 1400 units/hr Recheck 6 hr heparin level at 0900 Monitor for s/s bleed Daily CBC, Daily Heparin level  03/08/2020 PharmD 03/06/2020,2:41 AM

## 2020-03-06 NOTE — Progress Notes (Signed)
Patient remains alert, oriented(x4), ambulatory with cane. Discharge instructions have been reviewed. Questions, concerns were denied.

## 2020-03-06 NOTE — Care Management Important Message (Signed)
Important Message  Patient Details IM Letter given to Lanier Clam RN Case Manager to present to the Patient Name: Donald [name] MRN: [id number] Date of Birth: 1933-04-04   Medicare Important Message Given:  Yes     Caren Macadam 03/06/2020, 10:25 AM

## 2020-03-06 NOTE — Progress Notes (Addendum)
[facility] Progress Note     Subjective: CC:  NAEO. Feels better. Having more solid BMs. Says his pain is gone.  Objective: Vital signs in last 24 hours: Temp:  [98 F (36.7 C)-98.8 F (37.1 C)] 98 F (36.7 C) (07/21 0527) Pulse Rate:  [59-107] 67 (07/21 0527) Resp:  [17-18] 18 (07/21 0527) BP: (125-160)/(55-82) 131/73 (07/21 0527) SpO2:  [95 %-97 %] 95 % (07/21 0527) Last BM Date: 03/04/20  Intake/Output from previous day: 07/20 0701 - 07/21 0700 In: 1598.6 [P.O.:1080; I.V.:283.1; IV Piggyback:235.5] Out: -  Intake/Output this shift: No intake/output data recorded.  PE: Gen:  Alert, NAD, pleasant Card:  Regular rate and rhythm, pedal pulses 2+ BL Pulm:  Normal effort, clear to auscultation bilaterally Abd: Soft, overall non-tender, no guarding or rebound tenderness Psych: A&Ox3   Lab Results:  Recent Labs    03/05/20 0553 03/06/20 0204  WBC 15.2* 9.5  HGB 13.2 11.9*  HCT 40.5 36.4*  PLT 169 160   BMET Recent Labs    03/05/20 0553 03/06/20 0204  NA 134* 136  K 3.7 3.8  CL 99 99  CO2 26 26  GLUCOSE 109* 126*  BUN 10 12  CREATININE 0.47* 0.53*  CALCIUM 8.8* 8.9   PT/INR No results for input(s): LABPROT, INR in the last 72 hours. CMP     Component Value Date/Time   NA 136 03/06/2020 0204   K 3.8 03/06/2020 0204   CL 99 03/06/2020 0204   CO2 26 03/06/2020 0204   GLUCOSE 126 (H) 03/06/2020 0204   BUN 12 03/06/2020 0204   CREATININE 0.53 (L) 03/06/2020 0204   CALCIUM 8.9 03/06/2020 0204   PROT 6.6 03/03/2020 0701   ALBUMIN 3.8 03/03/2020 0701   AST 14 (L) 03/03/2020 0701   ALT 15 03/03/2020 0701   ALKPHOS 35 (L) 03/03/2020 0701   BILITOT 2.1 (H) 03/03/2020 0701   GFRNONAA >60 03/06/2020 0204   GFRAA >60 03/06/2020 0204   Lipase     Component Value Date/Time   LIPASE 20 03/02/2020 1929   Studies/Results: No results found.  Anti-infectives: Anti-infectives (From admission, onward)   Start     Dose/Rate Route Frequency  Ordered Stop   03/06/20 0000  amoxicillin-clavulanate (AUGMENTIN) 875-125 MG tablet     Discontinue     1 tablet Oral 2 times daily 03/06/20 0841 03/16/20 2359   03/03/20 0600  piperacillin-tazobactam (ZOSYN) IVPB 3.375 g     Discontinue     3.375 g 12.5 mL/hr over 240 Minutes Intravenous Every 8 hours 03/02/20 2351     03/02/20 2030  piperacillin-tazobactam (ZOSYN) IVPB 3.375 g        3.375 g 100 mL/hr over 30 Minutes Intravenous  Once 03/02/20 2027 03/02/20 2124     Assessment/Plan A.fib on Eliquis at home - continue to hold Eliquis, heparin gtt  HTN   Perforated appendicitis  - pain resolved, leukocytosis resolved, mobilizing, tolerating PO, having BMs. - stable for discharge from [facility] perspective. Wrote for 10 days of augmentin to complete a total of 14 days. Arranged follow up with Dr. Luisa Hart in 2-4 weeks.  - ok to resume Eliquis    LOS: 4 days   Hosie Spangle, [facility] Please see Amion for pager number during day hours 7:00am-4:30pm  Agree with above. Looks good.  Ready to go home.  Ovidio Kin, MD, [facility] Office phone:  [phone]

## 2020-03-08 LAB — CULTURE, BLOOD (ROUTINE X 2)
Culture: NO GROWTH
Culture: NO GROWTH
Special Requests: ADEQUATE
Special Requests: ADEQUATE

## 2020-06-04 NOTE — Progress Notes (Signed)
Cardiology Office Note:   Date:  06/06/2020  NAME:  Donald [name]    MRN: [id number] DOB:  July 19, 1933   PCP:  Daiva NakayamaPa, [facility]  Cardiologist:  No primary care provider on file.   Referring MD: Farris HasMorrow, Aaron, MD   Chief Complaint  Patient presents with  . Congestive Heart Failure   History of Present Illness:   Donald Grillshomas Burgett is a 86 y.o. male with a hx of CAD, atrial fibrillation, afib who is being seen today for the evaluation of CAD at the request of Farris HasMorrow, Aaron, MD.  He is recently relocated to [place] because of family.  [place] [facility] [place] [place].  Apparently he had bypass surgery 1998.  This was found as part of an abnormal stress test.  He also has atrial fibrillation.  This was diagnosed 1 year ago.  Seems he was seeing a cardiologist in [place] who recommended rate control.  He is on Eliquis no bleeding issues.  Heart rate stays in the seventies.  Also has been diagnosed with diastolic heart failure.  He has been watching the salt intake but lives in an independent living facility.  Apparently he has no control over the salt in his food.  He is taking Lasix 40 mg in the day and 40 mg in the afternoon.  He does have some evidence of lower extremity edema as well as some JVD.  Former smoker.  Blood pressure today 145/73.  He reports at home his blood pressure runs low around 110.  He request to come off some medications.  I recommended to stop amlodipine.  Most recent LDL cholesterol is at goal.  He is a former smoker of about 30 years.  He quit a number of years ago.  He denies any chest pain or shortness of breath with his current level activity.  He walks 1/2 mile in the mornings and 1/4 mile in the afternoons without any limitations such as chest pain, shortness of breath or palpitations.  His most recent A1c number is borderline.  He does drink alcohol daily but this is not in excess.  No drug use reported.  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  Problem List 1. CAD s/p CABG -CABG x3, 1998, [facility] 2. Diastolic HF -EF 60-65% [facility] 3. Permanent Afib 4.  Total cholesterol 140, HDL 50, LDL 65, triglycerides 145 -A1c 6.4  Past Medical History: Past Medical History:  Diagnosis Date  . A-fib (HCC)   . Arrhythmia   . CHF (congestive heart failure) (HCC)   . Coronary artery disease   . Hyperlipidemia   . Hypertension     Past Surgical History: Past Surgical History:  Procedure Laterality Date  . CARDIAC SURGERY    . CORONARY ARTERY BYPASS GRAFT  1998   x 3; [facility]    Current Medications: Current Meds  Medication Sig  . apixaban (ELIQUIS) 5 MG TABS tablet Take 5 mg by mouth 2 (two) times daily.  . Ascorbic Acid (VITAMIN C) 500 MG CAPS Take 500 capsules by mouth daily.  Marland Kitchen. aspirin EC 81 MG tablet Take 81 mg by mouth daily.  Marland Kitchen. atorvastatin (LIPITOR) 40 MG tablet Take 40 mg by mouth daily.  . cholecalciferol (VITAMIN D3) 25 MCG (1000 UNIT) tablet Take 1,000 Units by mouth daily.  . furosemide (LASIX) 40 MG tablet Take 40 mg by mouth See admin instructions. Takes 1 tablet in the morning 1/2 tablet at 2 pm then another 1/2 tablet  at 6 pm  . gabapentin (NEURONTIN) 300 MG capsule Take 300 mg by mouth 3 (three) times daily.  Marland Kitchen losartan (COZAAR) 100 MG tablet Take 100 mg by mouth daily.  . metoprolol tartrate (LOPRESSOR) 100 MG tablet Take 100 mg by mouth daily.   . [DISCONTINUED] amLODipine (NORVASC) 10 MG tablet Take 10 mg by mouth daily.     Allergies:    Bee venom, Chromium, Gabapentin, and Sulfa antibiotics   Social History: Social History   Socioeconomic History  . Marital status: Married    Spouse name: Not on file  . Number of children: 3  . Years of education: Not on file  . Highest education level: Not on file  Occupational History  . Occupation: retired Manufacturing engineer - Tax inspector  Tobacco Use  . Smoking status: Former Smoker    Packs/day: 0.50    Years: 35.00    Pack  years: 17.50  . Smokeless tobacco: Never Used  Substance and Sexual Activity  . Alcohol use: Yes  . Drug use: Never  . Sexual activity: Not on file  Other Topics Concern  . Not on file  Social History Narrative  . Not on file   Social Determinants of Health   Financial Resource Strain:   . Difficulty of Paying Living Expenses: Not on file  Food Insecurity:   . Worried About Programme researcher, broadcasting/film/video in the Last Year: Not on file  . Ran Out of Food in the Last Year: Not on file  Transportation Needs:   . Lack of Transportation (Medical): Not on file  . Lack of Transportation (Non-Medical): Not on file  Physical Activity:   . Days of Exercise per Week: Not on file  . Minutes of Exercise per Session: Not on file  Stress:   . Feeling of Stress : Not on file  Social Connections:   . Frequency of Communication with Friends and Family: Not on file  . Frequency of Social Gatherings with Friends and Family: Not on file  . Attends Religious Services: Not on file  . Active Member of Clubs or Organizations: Not on file  . Attends Banker Meetings: Not on file  . Marital Status: Not on file    Family History: The patient's family history includes Heart attack in his father and sister.  ROS:   All other ROS reviewed and negative. Pertinent positives noted in the HPI.     EKGs/Labs/Other Studies Reviewed:   The following studies were personally reviewed by me today:  EKG:  EKG is ordered today.  The ekg ordered today demonstrates atrial fibrillation, heart rate 69, right bundle branch block noted her UA independently seen by on 1, and was personally reviewed by me.   Echo 10/09/2019 [facility]   Left Ventricle: Normal left ventricular cavity size. LVIDd index measures 2.3 cm/m2.Mildly increased left  ventricular wall thickness.  Left Ventricle Normal left ventricular systolic function. No wall motion abnormalities in all visualized segments.  Function: Unable to assess  diastolic function in this patient.   LVEF: 65 %   Recent Labs: 03/03/2020: ALT 15 03/04/2020: Magnesium 1.5 03/06/2020: BUN 12; Creatinine, Ser 0.53; Hemoglobin 11.9; Platelets 160; Potassium 3.8; Sodium 136   Recent Lipid Panel No results found for: CHOL, TRIG, HDL, CHOLHDL, VLDL, LDLCALC, LDLDIRECT  Physical Exam:   VS:  BP (!) 145/73   Pulse 69   Ht 5\' 6"  (1.676 m)   Wt 182 lb 3.2 oz (82.6 kg)   SpO2 97%  BMI 29.41 kg/m    Wt Readings from Last 3 Encounters:  06/06/20 182 lb 3.2 oz (82.6 kg)  03/03/20 182 lb 5.1 oz (82.7 kg)    General: Well nourished, well developed, in no acute distress Heart: Atraumatic, normal size  Eyes: PEERLA, EOMI  Neck: Supple, + JVD Endocrine: No thryomegaly Cardiac: Normal S1, S2; irregular rhythm, no murmurs rubs or gallops Lungs: Clear to auscultation bilaterally, no wheezing, rhonchi or rales  Abd: Soft, nontender, no hepatomegaly  Ext: Trace edema Musculoskeletal: No deformities, BUE and BLE strength normal and equal Skin: Warm and dry, no rashes   Neuro: Alert and oriented to person, place, time, and situation, CNII-XII grossly intact, no focal deficits  Psych: Normal mood and affect   ASSESSMENT:   Jakyri Brunkhorst is a 86 y.o. male who presents for the following: 1. Persistent atrial fibrillation (HCC)   2. Chronic diastolic heart failure (HCC)   3. Coronary artery disease involving coronary bypass graft of native heart without angina pectoris   4. Primary hypertension   5. Mixed hyperlipidemia     PLAN:   1. Persistent atrial fibrillation (HCC) -Rate controlled on metoprolol 100 mg daily.  He remains on Eliquis 5 mg twice daily.  He should continue this.  No bleeding issues.  Given his age and lack of symptoms I recommend rate control moving forward.  2. Chronic diastolic heart failure (HCC) -Recently diagnosed with diastolic heart failure.  Controlled on Lasix.  He does have some evidence of volume on today's exam.  Would  recommend he continue his Lasix as he is doing.  I suspect he is getting a lot of salt from his independent living facility.  We did go over the importance of watching his salt intake and possibly asking the cafeteria staff there to reduce salt for him.  He will look into this. -He does report his blood pressure is low at home.  We will stop his amlodipine as this can cause worsening lower extremity edema.  3. Coronary artery disease involving coronary bypass graft of native heart without angina pectoris -CABG times 11/02/1996.  No symptoms of angina.  No evaluation as of recently.  Given his lack of symptoms I would not recommend further evaluation. -He will continue aspirin.  He is on Lipitor 40 mg daily.  Most recent LDL cholesterol 65.  Disposition: Return in about 6 months (around 12/05/2020).  Medication Adjustments/Labs and Tests Ordered: Current medicines are reviewed at length with the patient today.  Concerns regarding medicines are outlined above.  No orders of the defined types were placed in this encounter.  No orders of the defined types were placed in this encounter.   Patient Instructions  Medication Instructions:  STOP amlodipine  *If you need a refill on your cardiac medications before your next appointment, please call your pharmacy*  Follow-Up: At [facility], you and your health needs are our priority.  As part of our continuing mission to provide you with exceptional heart care, we have created designated Provider Care Teams.  These Care Teams include your primary Cardiologist (physician) and Advanced Practice Providers (APPs -  Physician Assistants and Nurse Practitioners) who all work together to provide you with the care you need, when you need it.  We recommend signing up for the patient portal called "MyChart".  Sign up information is provided on this After Visit Summary.  MyChart is used to connect with patients for Virtual Visits (Telemedicine).  Patients are  able to view lab/test results,  encounter notes, upcoming appointments, etc.  Non-urgent messages can be sent to your provider as well.   To learn more about what you can do with MyChart, go to [email or web address].    Your next appointment:   6 month(s)  The format for your next appointment:   In Person  Provider:   Lennie Odor, MD         Signed, Lenna Gilford. Flora Lipps, MD [facility]  [address] [place], [place] [address] [phone]  06/06/2020 11:28 AM

## 2020-06-05 ENCOUNTER — Other Ambulatory Visit: Payer: Self-pay | Admitting: Surgery

## 2020-06-05 DIAGNOSIS — K3532 Acute appendicitis with perforation and localized peritonitis, without abscess: Secondary | ICD-10-CM

## 2020-06-06 ENCOUNTER — Encounter: Payer: Self-pay | Admitting: Cardiovascular Disease

## 2020-06-06 ENCOUNTER — Ambulatory Visit (INDEPENDENT_AMBULATORY_CARE_PROVIDER_SITE_OTHER): Payer: Medicare Other | Admitting: Cardiovascular Disease

## 2020-06-06 ENCOUNTER — Other Ambulatory Visit: Payer: Self-pay

## 2020-06-06 VITALS — BP 145/73 | HR 69 | Ht 66.0 in | Wt 182.2 lb

## 2020-06-06 DIAGNOSIS — I4819 Other persistent atrial fibrillation: Secondary | ICD-10-CM

## 2020-06-06 DIAGNOSIS — I25810 Atherosclerosis of coronary artery bypass graft(s) without angina pectoris: Secondary | ICD-10-CM | POA: Diagnosis not present

## 2020-06-06 DIAGNOSIS — I5032 Chronic diastolic (congestive) heart failure: Secondary | ICD-10-CM | POA: Diagnosis not present

## 2020-06-06 DIAGNOSIS — E782 Mixed hyperlipidemia: Secondary | ICD-10-CM

## 2020-06-06 DIAGNOSIS — I10 Essential (primary) hypertension: Secondary | ICD-10-CM | POA: Diagnosis not present

## 2020-06-06 NOTE — Addendum Note (Signed)
Addended by: Fabiha Rougeau C on: 06/06/2020 04:06 PM ° ° Modules accepted: Orders ° °

## 2020-06-06 NOTE — Patient Instructions (Signed)
Medication Instructions:  STOP amlodipine  *If you need a refill on your cardiac medications before your next appointment, please call your pharmacy*  Follow-Up: At [facility], you and your health needs are our priority.  As part of our continuing mission to provide you with exceptional heart care, we have created designated Provider Care Teams.  These Care Teams include your primary Cardiologist (physician) and Advanced Practice Providers (APPs -  Physician Assistants and Nurse Practitioners) who all work together to provide you with the care you need, when you need it.  We recommend signing up for the patient portal called "MyChart".  Sign up information is provided on this After Visit Summary.  MyChart is used to connect with patients for Virtual Visits (Telemedicine).  Patients are able to view lab/test results, encounter notes, upcoming appointments, etc.  Non-urgent messages can be sent to your provider as well.   To learn more about what you can do with MyChart, go to [email or web address].    Your next appointment:   6 month(s)  The format for your next appointment:   In Person  Provider:   Lennie Odor, MD

## 2020-06-17 ENCOUNTER — Other Ambulatory Visit: Payer: Medicare Other

## 2020-07-15 ENCOUNTER — Ambulatory Visit
Admission: RE | Admit: 2020-07-15 | Discharge: 2020-07-15 | Disposition: A | Discharge status: Home / Self Care | Payer: Medicare Other | Source: Ambulatory Visit | Attending: Surgery | Admitting: Surgery

## 2020-07-15 DIAGNOSIS — K3532 Acute appendicitis with perforation and localized peritonitis, without abscess: Secondary | ICD-10-CM

## 2020-07-15 MED ORDER — IOPAMIDOL (ISOVUE-300) INJECTION 61%
100.0000 mL | Freq: Once | INTRAVENOUS | Status: AC | PRN
  Administered 2020-07-15: 100 mL via INTRAVENOUS

## 2020-11-26 NOTE — Progress Notes (Signed)
Cardiology Office Note:   Date:  11/27/2020  NAME:  Donald [name]    MRN: [id number] DOB:  04-26-1933   [facility]  [facility]  Cardiologist:  No primary care provider on file.  Electrophysiologist:  None   Referring MD: [facility]   Chief Complaint  Patient presents with  . Coronary Artery Disease   History of Present Illness:   Donald [name] is a 87 y.o. male with a hx of permanent A. fib, CAD status post CABG, diastolic heart failure, diabetes who presents for follow-up.  He is doing well.  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  Status post right knee injection.  Denies any chest pain or shortness of breath.  He is walking up to 1/2 mile per day.  He splits his into 1/4 mile in the morning and 1/4 mile in the afternoon.  No angina with this.  No increased lower extremity edema.  Volume status is quite acceptable today.  Most recent LDL cholesterol is at goal.  Blood pressure 140/78.  No symptoms.  No bleeding on Eliquis.  Problem List 1. CAD s/p CABG -CABG x3, 1998, [facility] 2. Diastolic HF -EF 60-65% [facility] 3. Permanent Afib -CHADSVASC=5 (age, CAD, CHF, HTN) 4.  Total cholesterol 140, HDL 50, LDL 65, triglycerides 145 -A1c 6.5  Past Medical History: Past Medical History:  Diagnosis Date  . A-fib (HCC)   . Arrhythmia   . CHF (congestive heart failure) (HCC)   . Coronary artery disease   . Hyperlipidemia   . Hypertension     Past Surgical History: Past Surgical History:  Procedure Laterality Date  . CARDIAC SURGERY    . CORONARY ARTERY BYPASS GRAFT  1998   x 3; [facility]    Current Medications: Current Meds  Medication Sig  . apixaban (ELIQUIS) 5 MG TABS tablet Take 5 mg by mouth 2 (two) times daily.  . Ascorbic Acid (VITAMIN C) 500 MG CAPS Take 500 capsules by mouth daily.  Marland Kitchen aspirin EC 81 MG tablet Take 81 mg by mouth daily.  Marland Kitchen atorvastatin (LIPITOR) 40 MG tablet Take 40 mg by mouth daily.  . cholecalciferol (VITAMIN D3) 25  MCG (1000 UNIT) tablet Take 1,000 Units by mouth daily.  . furosemide (LASIX) 40 MG tablet Take 40 mg by mouth See admin instructions. Takes 1 tablet in the morning 1/2 tablet at 2 pm then another 1/2 tablet at 6 pm  . gabapentin (NEURONTIN) 300 MG capsule Take 300 mg by mouth 3 (three) times daily.  Marland Kitchen losartan (COZAAR) 100 MG tablet Take 100 mg by mouth daily.  . metoprolol tartrate (LOPRESSOR) 100 MG tablet Take 100 mg by mouth daily.      Allergies:    Bee venom, Chromium, Gabapentin, and Sulfa antibiotics   Social History: Social History   Socioeconomic History  . Marital status: Married    Spouse name: Not on file  . Number of children: 3  . Years of education: Not on file  . Highest education level: Not on file  Occupational History  . Occupation: retired Manufacturing engineer - Tax inspector  Tobacco Use  . Smoking status: Former Smoker    Packs/day: 0.50    Years: 35.00    Pack years: 17.50  . Smokeless tobacco: Never Used  Substance and Sexual Activity  . Alcohol use: Yes  . Drug use: Never  . Sexual activity: Not on file  Other Topics Concern  . Not on file  Social History Narrative  . Not on file   Social Determinants of Health   Financial Resource Strain: Not on file  Food Insecurity: Not on file  Transportation Needs: Not on file  Physical Activity: Not on file  Stress: Not on file  Social Connections: Not on file     Family History: The patient's family history includes Heart attack in his father and sister.  ROS:   All other ROS reviewed and negative. Pertinent positives noted in the HPI.     EKGs/Labs/Other Studies Reviewed:   The following studies were personally reviewed by me today: Recent Labs: 03/03/2020: ALT 15 03/04/2020: Magnesium 1.5 03/06/2020: BUN 12; Creatinine, Ser 0.53; Hemoglobin 11.9; Platelets 160; Potassium 3.8; Sodium 136   Recent Lipid Panel No results found for: CHOL, TRIG, HDL, CHOLHDL, VLDL, LDLCALC, LDLDIRECT  Physical  Exam:   VS:  BP 140/78   Pulse 89   Ht 5' 6.5" (1.689 m)   Wt 182 lb 9.6 oz (82.8 kg)   SpO2 96%   BMI 29.03 kg/m    Wt Readings from Last 3 Encounters:  11/27/20 182 lb 9.6 oz (82.8 kg)  06/06/20 182 lb 3.2 oz (82.6 kg)  03/03/20 182 lb 5.1 oz (82.7 kg)    General: Well nourished, well developed, in no acute distress Head: Atraumatic, normal size  Eyes: PEERLA, EOMI  Neck: Supple, no JVD Endocrine: No thryomegaly Cardiac: Normal S1, S2; irregular rhythm, no murmurs rubs or gallops Lungs: Clear to auscultation bilaterally, no wheezing, rhonchi or rales  Abd: Soft, nontender, no hepatomegaly  Ext: No edema, pulses 2+ Musculoskeletal: No deformities, BUE and BLE strength normal and equal Skin: Warm and dry, no rashes   Neuro: Alert and oriented to person, place, time, and situation, CNII-XII grossly intact, no focal deficits  Psych: Normal mood and affect   ASSESSMENT:   Donald [name] is a 87 y.o. male who presents for the following: 1. Persistent atrial fibrillation (HCC)   2. Chronic diastolic heart failure (HCC)   3. Coronary artery disease involving coronary bypass graft of native heart without angina pectoris   4. Mixed hyperlipidemia   5. Primary hypertension     PLAN:   1. Persistent atrial fibrillation (HCC) -Rate controlled.  On metoprolol.  He does not know he is in atrial fibrillation.  Does not bother him.  I would recommend to continue with rate control strategy.  He is on Eliquis 5 mg twice a day for stroke prophylaxis.  2. Chronic diastolic heart failure (HCC) -Euvolemic on examination.  He takes 40 mg of Lasix in the morning and 40 mg in the afternoon.  He will continue this.  3. Coronary artery disease involving coronary bypass graft of native heart without angina pectoris 4. Mixed hyperlipidemia -CAD status post CABG in 1998 at [facility].  No symptoms of chest pain or shortness of breath.  He will continue with aspirin as well as statin.  Most recent LDL  cholesterol is at goal.  65.  5. Primary hypertension -Well-controlled.  No change in medications.   Disposition: Return in about 1 year (around 11/27/2021).  Medication Adjustments/Labs and Tests Ordered: Current medicines are reviewed at length with the patient today.  Concerns regarding medicines are outlined above.  No orders of the defined types were placed in this encounter.  No orders of the defined types were placed in this encounter.   Patient Instructions  Medication Instructions:  The current medical regimen is effective;  continue present plan and medications.  *  If you need a refill on your cardiac medications before your next appointment, please call your pharmacy*   Follow-Up: At [facility], you and your health needs are our priority.  As part of our continuing mission to provide you with exceptional heart care, we have created designated Provider Care Teams.  These Care Teams include your primary Cardiologist (physician) and Advanced Practice Providers (APPs -  Physician Assistants and Nurse Practitioners) who all work together to provide you with the care you need, when you need it.  We recommend signing up for the patient portal called "MyChart".  Sign up information is provided on this After Visit Summary.  MyChart is used to connect with patients for Virtual Visits (Telemedicine).  Patients are able to view lab/test results, encounter notes, upcoming appointments, etc.  Non-urgent messages can be sent to your provider as well.   To learn more about what you can do with MyChart, go to [email or web address].    Your next appointment:   12 month(s)  The format for your next appointment:   In Person  Provider:   Lennie Odor, MD        Time Spent with Patient: I have spent a total of 25 minutes with patient reviewing hospital notes, telemetry, EKGs, labs and examining the patient as well as establishing an assessment and plan that was discussed with the  patient.  > 50% of time was spent in direct patient care.  Signed, Lenna Gilford. Flora Lipps, MD, [facility]  [facility]  [address] [place], [place] [address] [phone]  11/27/2020 11:12 AM

## 2020-11-27 ENCOUNTER — Encounter: Payer: Self-pay | Admitting: Cardiovascular Disease

## 2020-11-27 ENCOUNTER — Ambulatory Visit (INDEPENDENT_AMBULATORY_CARE_PROVIDER_SITE_OTHER): Payer: Medicare Other | Admitting: Cardiovascular Disease

## 2020-11-27 ENCOUNTER — Other Ambulatory Visit: Payer: Self-pay

## 2020-11-27 VITALS — BP 140/78 | HR 89 | Ht 66.5 in | Wt 182.6 lb

## 2020-11-27 DIAGNOSIS — I25810 Atherosclerosis of coronary artery bypass graft(s) without angina pectoris: Secondary | ICD-10-CM | POA: Diagnosis not present

## 2020-11-27 DIAGNOSIS — E782 Mixed hyperlipidemia: Secondary | ICD-10-CM | POA: Diagnosis not present

## 2020-11-27 DIAGNOSIS — I4819 Other persistent atrial fibrillation: Secondary | ICD-10-CM | POA: Diagnosis not present

## 2020-11-27 DIAGNOSIS — I10 Essential (primary) hypertension: Secondary | ICD-10-CM

## 2020-11-27 DIAGNOSIS — I5032 Chronic diastolic (congestive) heart failure: Secondary | ICD-10-CM | POA: Diagnosis not present

## 2020-11-27 NOTE — Patient Instructions (Signed)

## 2021-06-07 IMAGING — CT CT ABD-PELV W/ CM
2 of 5 series · 14 of 46 positions shown, 16 images · IV contrast (omnipaque)
Comparison: None

CLINICAL DATA: Abdominal pain, fever

EXAM:
CT ABDOMEN AND PELVIS WITH CONTRAST
TECHNIQUE: Multidetector CT imaging of the abdomen and pelvis was performed
using the standard protocol following bolus administration of
intravenous contrast.
CONTRAST:  100mL OMNIPAQUE IOHEXOL 300 MG/ML  SOLN

[Series 2: axial st · axial · 0.79mm/px · z∈[-461,-96]mm · 11 of 87 slices shown, 13 images]
[im 7/87  soft-tissue]
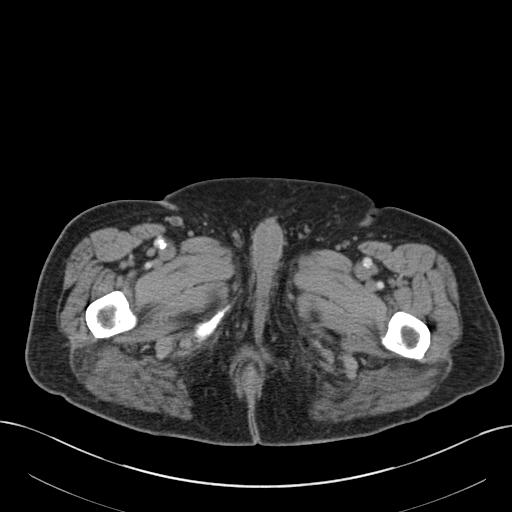
[im 7/87  bone]
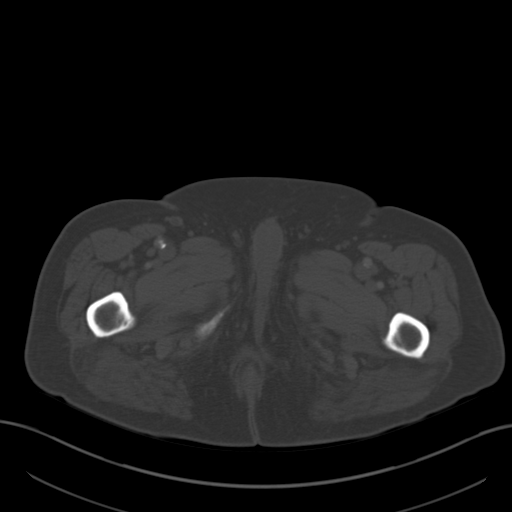
[im 14/87  soft-tissue]
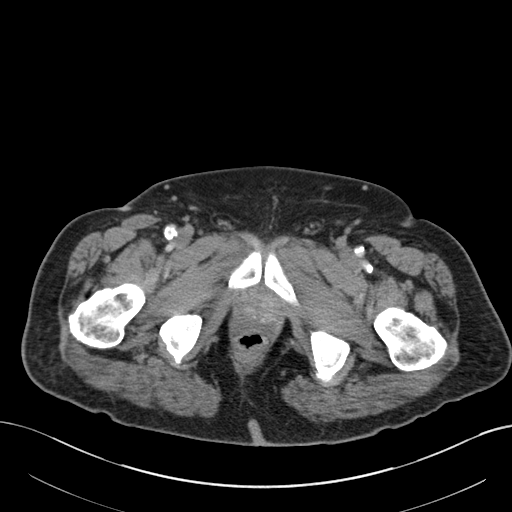
[im 20/87  soft-tissue]
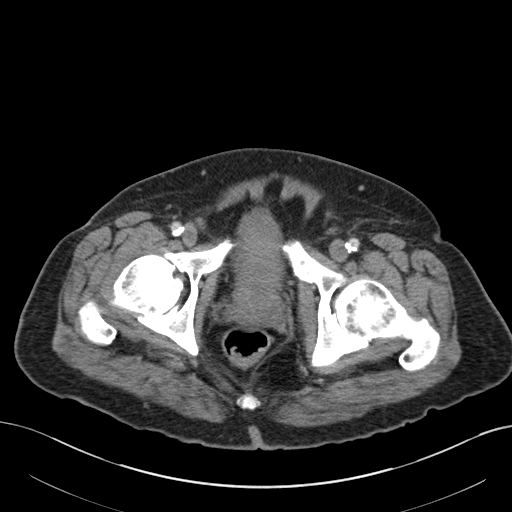
[im 27/87  soft-tissue]
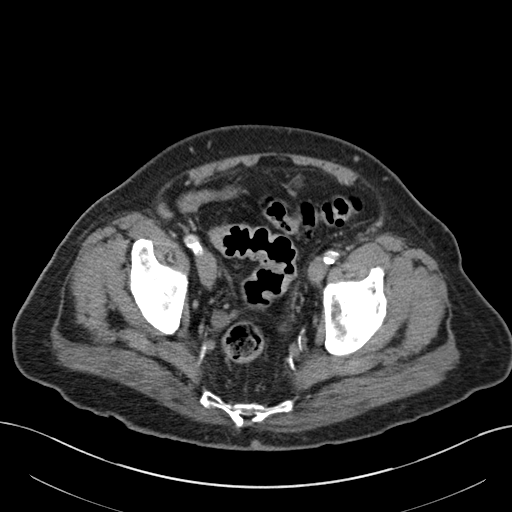
[im 34/87  soft-tissue]
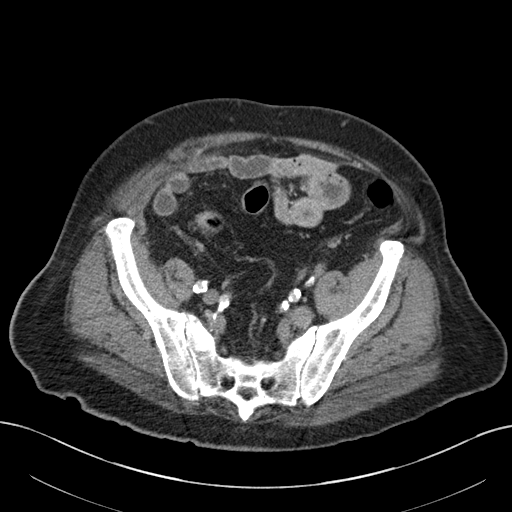
[im 47/87  soft-tissue]
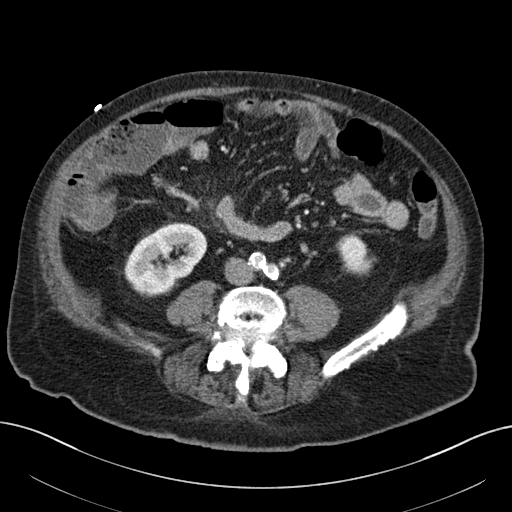
[im 53/87  soft-tissue]
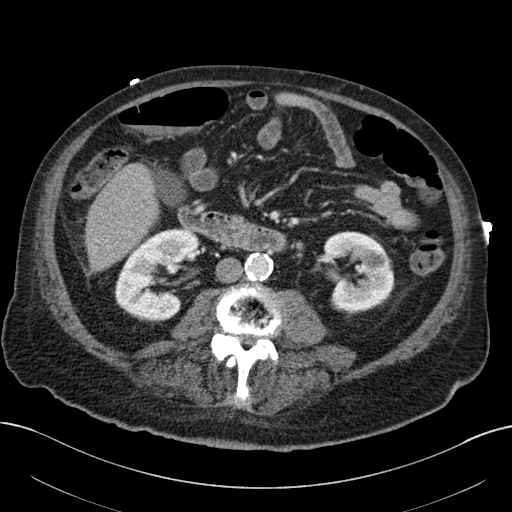
[im 60/87  soft-tissue]
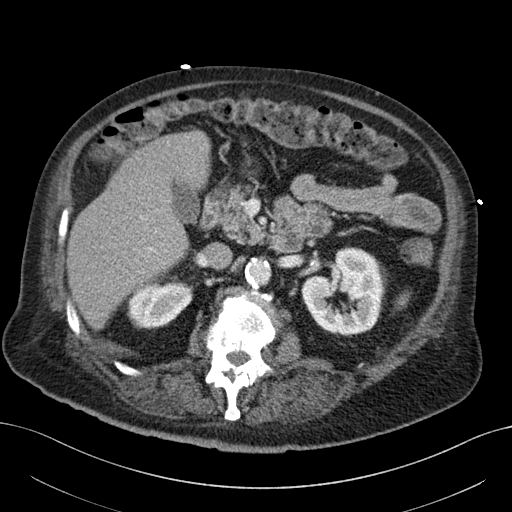
[im 67/87  soft-tissue]
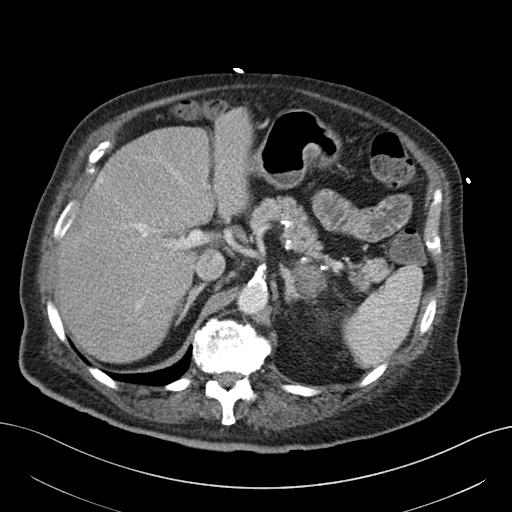
[im 67/87  bone]
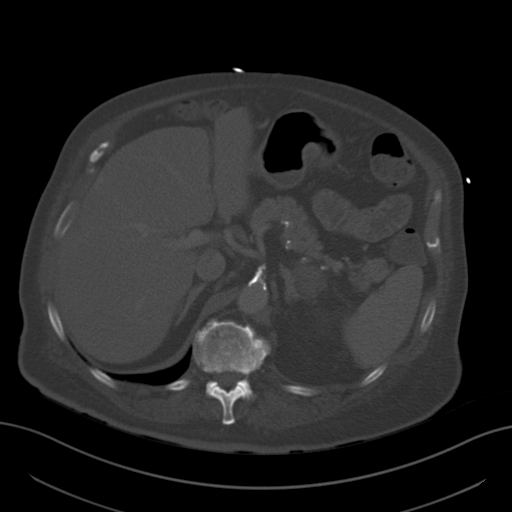
[im 73/87  soft-tissue]
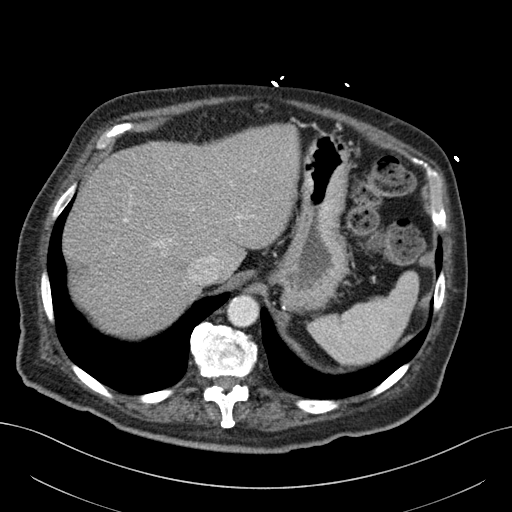
[im 80/87  soft-tissue]
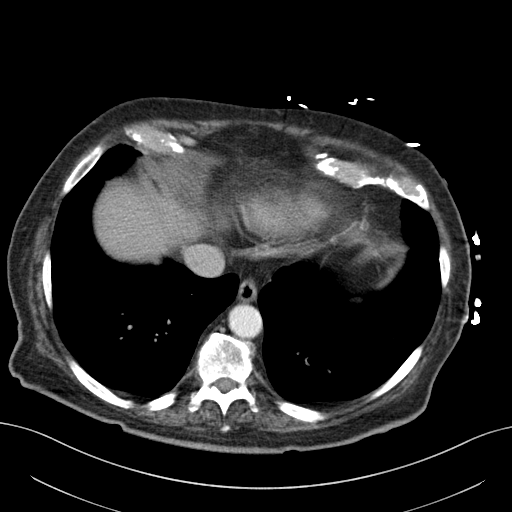

[Series 5: coronal st · coronal · 0.79mm/px · 3 of 156 slices shown]
[im 52/156  soft-tissue]
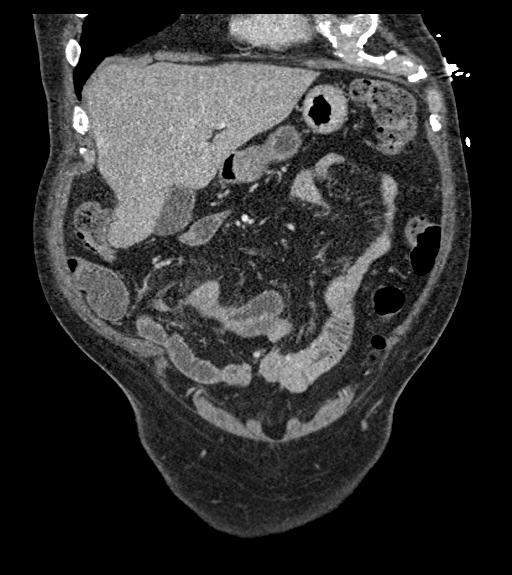
[im 69/156  soft-tissue]
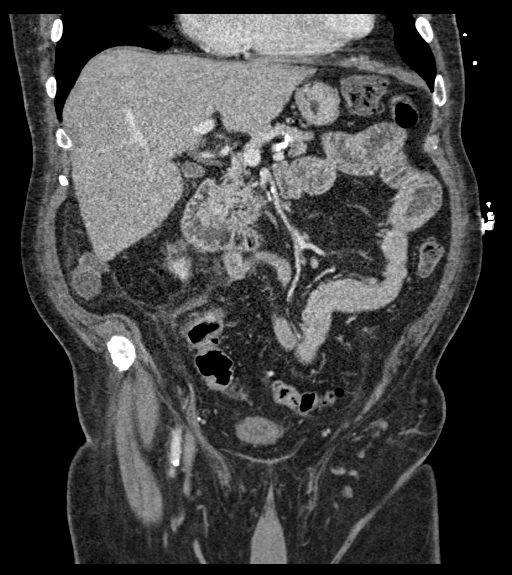
[im 87/156  soft-tissue]
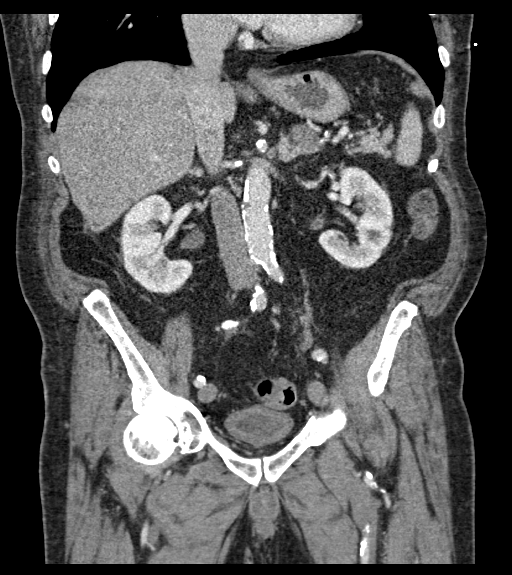

[14 of 46 positions shown; findings below may reference images not displayed]

FINDINGS: Lower chest: Lung bases are clear. Cardiac size at the upper limits
of normal. No pericardial effusion.

Hepatobiliary: Hypoattenuating focus in the caudate lobe measuring
1.2 cm, some possible centripetal fill in on delayed phase imaging.
Few additional scattered subcentimeter hypertension foci too small
to fully characterize on CT imaging but statistically likely benign.
Smooth liver surface contour. Hepatic attenuation within expected
normals for phase of imaging. Normal gallbladder and biliary tree
without visible calcified gallstones.

Pancreas: Unremarkable. No pancreatic ductal dilatation or
surrounding inflammatory changes.

Spleen: Normal in size without focal abnormality.

Adrenals/Urinary Tract: There is a 2.2 cm fat attenuation left
adrenal nodule arising from the medial limb ([DATE]) and a more
indeterminate 2.6 cm intermediate attenuation lesion arising from
the lateral limb/body without significant washout on postcontrast
imaging ([DATE]). Kidneys enhance and excrete symmetrically. Fluid
attenuation cyst measuring 1.7 cm in the interpolar left kidney.
Additional subcentimeter hypertension foci too small to fully
characterize on CT imaging but statistically likely benign. No
worrisome renal lesions. No urolithiasis or hydronephrosis. Urinary
bladder is largely decompressed at the time of exam and therefore
poorly evaluated by CT imaging. Bladder wall thickening is
nonspecific, possibly related to underdistention though could
correlate for symptoms cystitis.

Stomach/Bowel: Distal esophagus, stomach and duodenum are
unremarkable. There are several focally thickened segments of small
bowel in the right mid abdomen ([DATE] for exam) which appears
reactive to the adjacent phlegmon and inflammation centered upon the
tip of the appendix with evidence of perforation given small
punctate foci of extraluminal gas and free fluid. Much of the colon
is otherwise unremarkable aside from a small amount of adjacent
reactive thickening upon the mid sigmoid in a region of several
colonic diverticula possibly causing a reactive diverticulitis.

Vascular/Lymphatic: Atherosclerotic calcifications within the
abdominal aorta and branch vessels. No aneurysm or ectasia. No
enlarged abdominopelvic lymph nodes. Reactive adenopathy is present
in the mesentery adjacent the appendiceal perforation.

Reproductive: Borderline prostatomegaly. Few coarse eccentric
calcifications. Seminal vesicles are unremarkable.

Other: Perforated appendicitis with surrounding phlegmonous change
and few foci of extraluminal gas and trace free fluid. Some
associated peritoneal thickening. No other abdominopelvic free air
or fluid. No bowel containing hernias. Small fat containing
umbilical hernia.

Musculoskeletal: No acute osseous abnormality or suspicious osseous
lesion. Multilevel degenerative changes are present in the imaged
portions of the spine. Stepwise retrolisthesis L1-L3 and grade 1
anterolisthesis L4 on 5. Advanced multilevel discogenic change and
facet arthropathy resulting in multilevel moderate to severe
foraminal and canal stenoses throughout the lumbar spine. Additional
moderate degenerative changes in the hips and pelvis.
IMPRESSION: 1. Appendicitis with likely small microperforation with surrounding
phlegmonous change and few foci of extraluminal gas and trace free
fluid. No organized collection is seen at this time.
2. Several focally thickened segments of adjacent small bowel and
the mid sigmoid including several minimally colonic diverticula
favored to be reactive/secondary to the appendicitis.
3. Bladder wall thickening is nonspecific, possibly related to
underdistention though could correlate for symptoms of cystitis.
4. Left adrenal myelolipoma. Additional indeterminate 2.6 cm
intermediate attenuation lesion arising from the left adrenal gland.
Could consider further evaluation with nonemergent adrenal protocol
CT or MRI. This recommendation follows ACR consensus guidelines:
Management of Incidental Adrenal Masses: A White Paper of the ACR
Incidental Findings Committee. [HOSPITAL] 9796;14:[id number].
5. Aortic Atherosclerosis ([id number]-[id number].N).

## 2021-10-20 IMAGING — CT CT ABD-PELV W/ CM
2 of 5 series · 12 of 46 positions shown, 14 images · IV contrast (iopamidol)
Comparison: 03/02/2020 from [HOSPITAL]

CLINICAL DATA: Follow-up appendicitis following antibiotic therapy.

Creatinine was obtained on site at [HOSPITAL] at [REDACTED].
Results: Creatinine 0.6 mg/dL.
EXAM:
CT ABDOMEN AND PELVIS WITH CONTRAST
TECHNIQUE: Multidetector CT imaging of the abdomen and pelvis was performed
using the standard protocol following bolus administration of
intravenous contrast.
CONTRAST:  100mL YT5PSQ-CII IOPAMIDOL (YT5PSQ-CII) INJECTION 61%

[Series 2: abd pelvis 5.00 br40 s3 axial · axial · 0.64mm/px · z∈[+1210,+1590]mm · 9 of 88 slices shown, 11 images]
[im 6/88  soft-tissue]
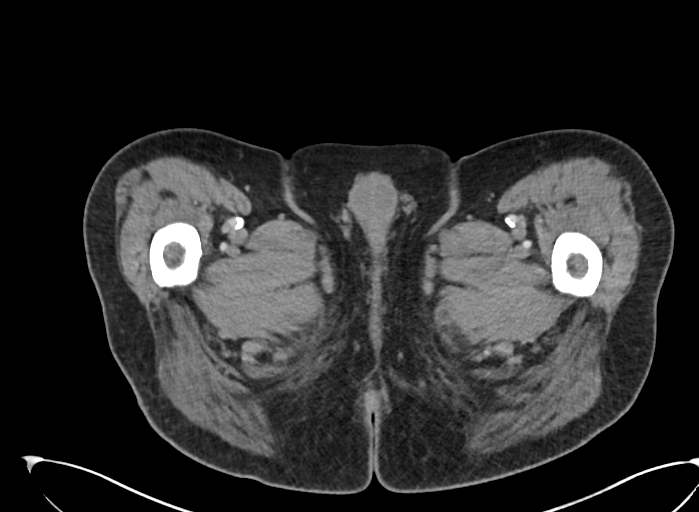
[im 6/88  bone]
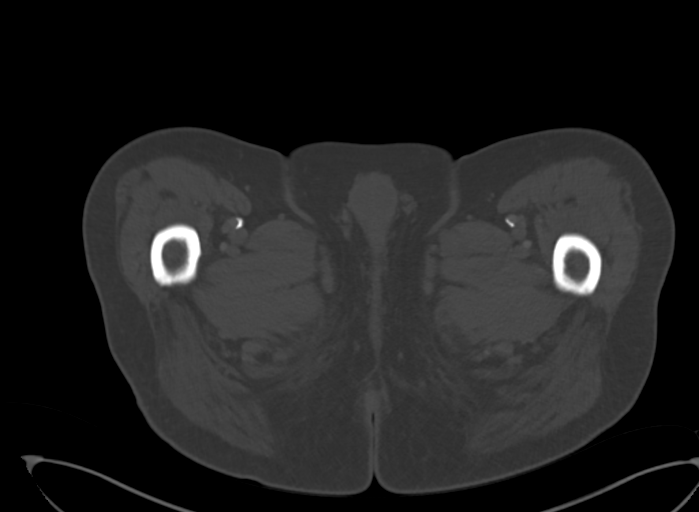
[im 16/88  soft-tissue]
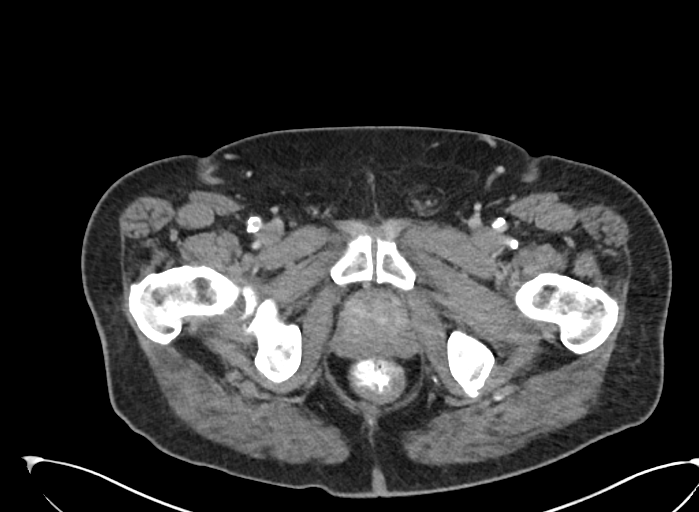
[im 26/88  soft-tissue]
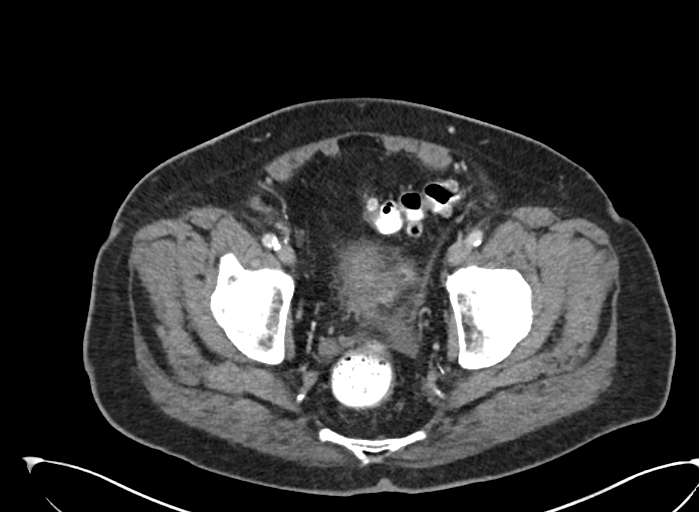
[im 36/88  soft-tissue]
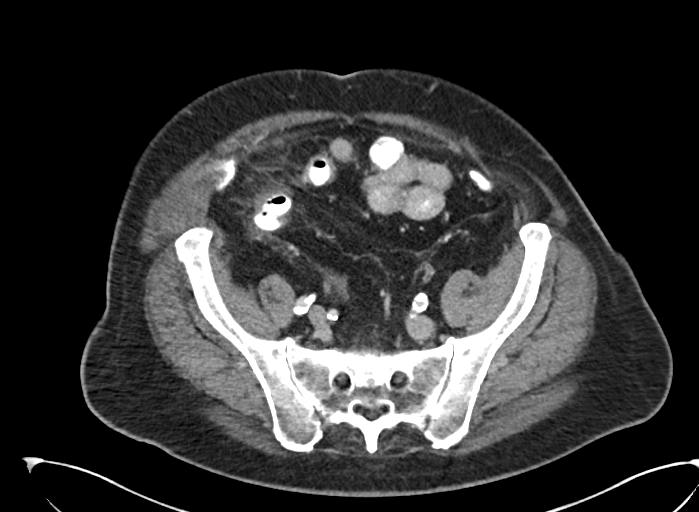
[im 47/88  soft-tissue]
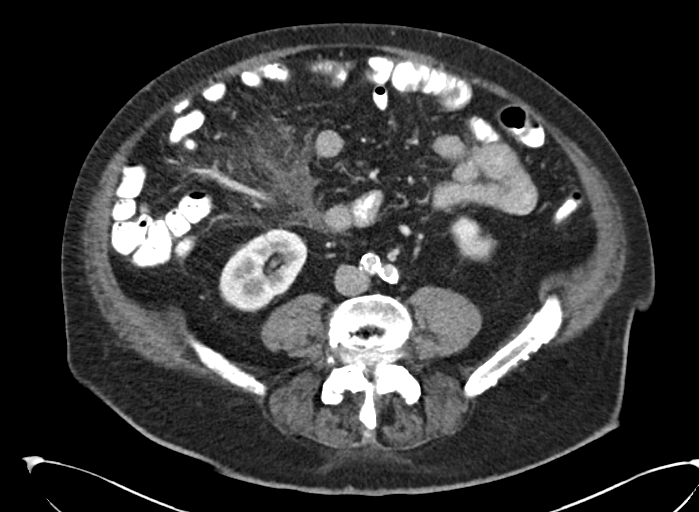
[im 52/88  soft-tissue]
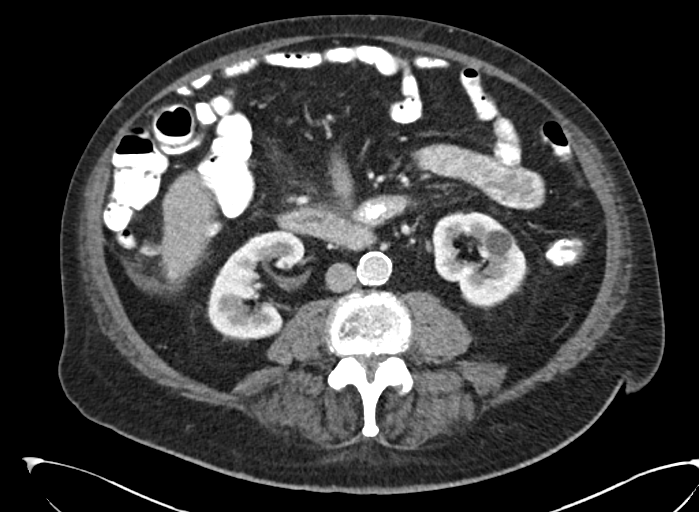
[im 62/88  soft-tissue]
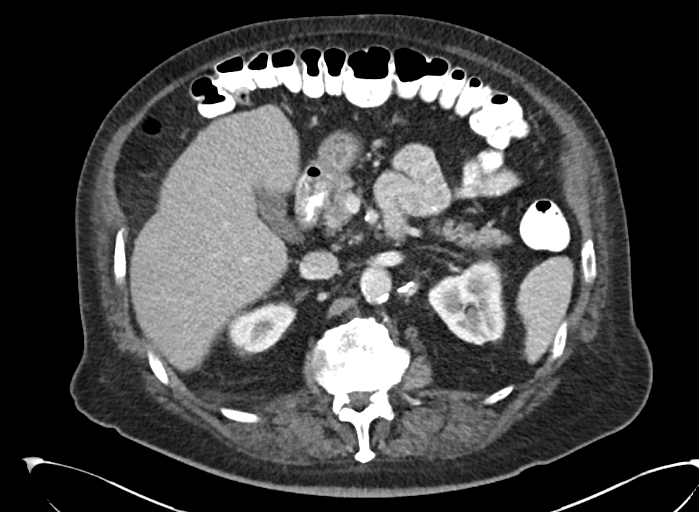
[im 72/88  soft-tissue]
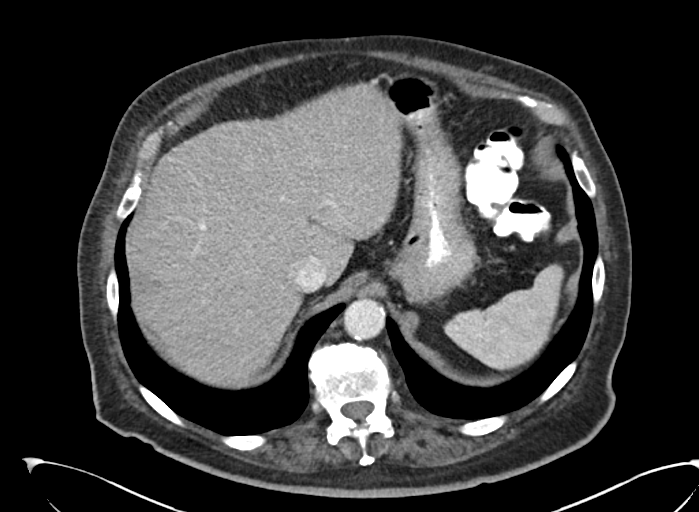
[im 82/88  soft-tissue]
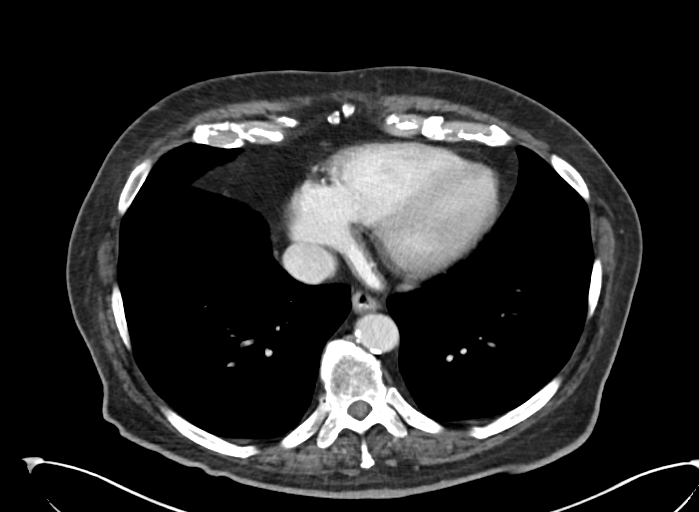
[im 82/88  bone]
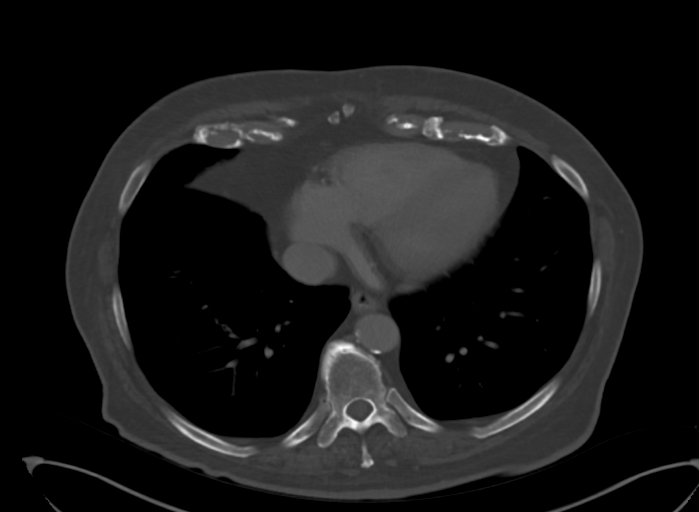

[Series 6: abd pelvis 2.00 br40 s3 cor · coronal · 0.78mm/px · 3 of 157 slices shown]
[im 53/157  soft-tissue]
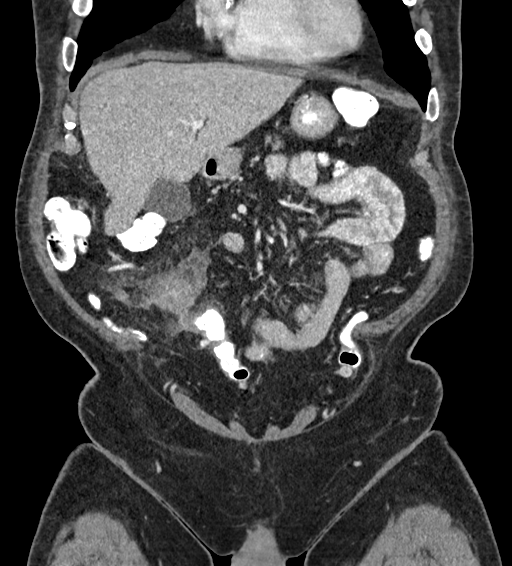
[im 70/157  soft-tissue]
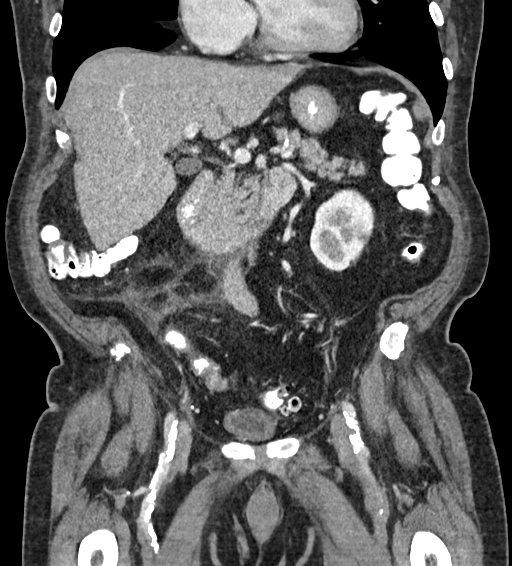
[im 87/157  soft-tissue]
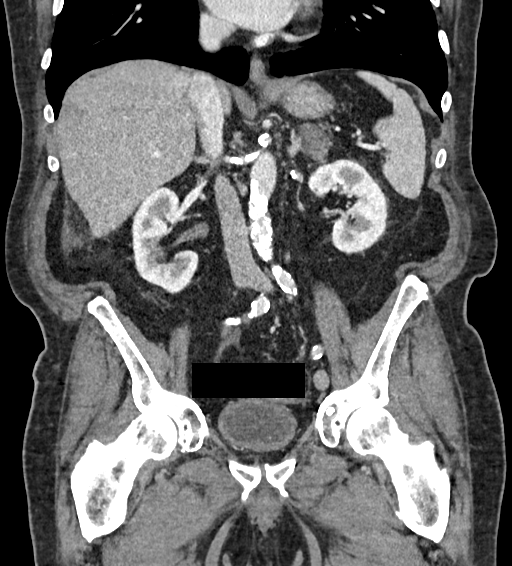

[12 of 46 positions shown; findings below may reference images not displayed]

FINDINGS: Lower chest: No acute findings.

Hepatobiliary: No mass visualized on this unenhanced exam. Tiny cyst
in the medial right hepatic lobe remains stable. Gallbladder is
unremarkable. No evidence of biliary ductal dilatation.

Pancreas: No mass or inflammatory process visualized on this
unenhanced exam.

Spleen:  Within normal limits in size.

Adrenals/Urinary tract: 2 left adrenal mass is measuring up to
cm remains stable and contain macroscopic fat, consistent with
benign myelolipomas. A few small renal cysts remains stable. No
evidence of urolithiasis or hydronephrosis. Stable mild diffuse
bladder wall thickening is seen, likely due to chronic bladder
outlet obstruction given enlarged prostate.

Stomach/Bowel: No evidence of bowel obstruction. Acute appendicitis
is again seen involving the distal appendix, which shows increased
periappendiceal inflammatory changes with secondary wall thickening
of adjacent small bowel loops in sigmoid colon. No evidence of
extraluminal air or abscess. Diverticulosis is seen mainly involving
the sigmoid colon, however there is no evidence of diverticulitis.

Vascular/Lymphatic: No pathologically enlarged lymph nodes
identified. No evidence of abdominal aortic aneurysm. Aortic
atherosclerotic calcification noted.

Reproductive:  Stable mildly enlarged prostate.

Other:  None.

Musculoskeletal:  No suspicious bone lesions identified.
IMPRESSION: Worsening appendicitis since prior study, with increased secondary
wall thickening involving adjacent small bowel and sigmoid colon. No
evidence of abscess or perforation.

Colonic diverticulosis. No radiographic evidence of diverticulitis.

Stable mildly enlarged prostate and findings of chronic bladder
outlet obstruction.

Stable benign left adrenal myelolipomas.

These results will be called to the ordering clinician or
representative by the Radiologist Assistant, and communication
documented in the PACS or [REDACTED].

Aortic Atherosclerosis ([id number].P).

## 2023-10-28 ENCOUNTER — Emergency Department (HOSPITAL_COMMUNITY)

## 2023-10-28 ENCOUNTER — Emergency Department (HOSPITAL_COMMUNITY)
Admission: EM | Admit: 2023-10-28 | Discharge: 2023-10-28 | Disposition: A | Discharge status: Home / Self Care | Attending: Emergency Medicine | Admitting: Emergency Medicine

## 2023-10-28 ENCOUNTER — Other Ambulatory Visit: Payer: Self-pay

## 2023-10-28 ENCOUNTER — Encounter (HOSPITAL_COMMUNITY): Payer: Self-pay

## 2023-10-28 DIAGNOSIS — I509 Heart failure, unspecified: Secondary | ICD-10-CM | POA: Diagnosis not present

## 2023-10-28 DIAGNOSIS — Z7982 Long term (current) use of aspirin: Secondary | ICD-10-CM | POA: Insufficient documentation

## 2023-10-28 DIAGNOSIS — I4891 Unspecified atrial fibrillation: Secondary | ICD-10-CM | POA: Insufficient documentation

## 2023-10-28 DIAGNOSIS — Z79899 Other long term (current) drug therapy: Secondary | ICD-10-CM | POA: Diagnosis not present

## 2023-10-28 DIAGNOSIS — Z7901 Long term (current) use of anticoagulants: Secondary | ICD-10-CM | POA: Diagnosis not present

## 2023-10-28 DIAGNOSIS — I110 Hypertensive heart disease with heart failure: Secondary | ICD-10-CM | POA: Insufficient documentation

## 2023-10-28 DIAGNOSIS — M25561 Pain in right knee: Secondary | ICD-10-CM | POA: Diagnosis present

## 2023-10-28 MED ORDER — OXYCODONE HCL 5 MG PO TABS
5.0000 mg | ORAL_TABLET | Freq: Once | ORAL | Status: AC
  Administered 2023-10-28: 5 mg via ORAL
  Filled 2023-10-28: qty 1

## 2023-10-28 MED ORDER — OXYCODONE HCL 5 MG PO TABS: 5.0000 mg | ORAL_TABLET | Freq: Four times a day (QID) | ORAL | 0 refills | Status: AC | PRN

## 2023-10-28 NOTE — ED Provider Notes (Cosign Needed Addendum)
 [place] [facility] DEPARTMENT AT [facility] Provider Note   CSN: [id number] Arrival date & time: 10/28/23  4098     History  Chief Complaint  Patient presents with   Knee Pain    Fall    Donald [name] is a 89 y.o. male with history of A-fib, CHF, hypertension, presents with concern for right knee pain that started yesterday.  States he was getting into his car, and his right knee gave out on him.  He denies hearing or feeling any pop.  This did not cause him to fall or hit his head.  He was able to walk immediately after this incident, but a couple hours later, reports increased difficulty walking on the right knee.  Also reports increased right knee swelling.  Denies any numbness or tingling in his right lower extremity.  Reports taking Tylenol at approximately 8 AM this morning which has not significantly improve his pain.  HPI     Home Medications Prior to Admission medications   Medication Sig Start Date End Date Taking? Authorizing Provider  oxyCODONE (ROXICODONE) 5 MG immediate release tablet Take 1 tablet (5 mg total) by mouth every 6 (six) hours as needed for up to 3 days for severe pain (pain score 7-10) (Pain not controlled with tylenol). 10/28/23 10/31/23 Yes Arabella Merles, PA-C  apixaban (ELIQUIS) 5 MG TABS tablet Take 5 mg by mouth 2 (two) times daily.    [provider]  Ascorbic Acid (VITAMIN C) 500 MG CAPS Take 500 capsules by mouth daily.    [provider]  aspirin EC 81 MG tablet Take 81 mg by mouth daily.    [provider]  atorvastatin (LIPITOR) 40 MG tablet Take 40 mg by mouth daily.    [provider]  cholecalciferol (VITAMIN D3) 25 MCG (1000 UNIT) tablet Take 1,000 Units by mouth daily.    [provider]  furosemide (LASIX) 40 MG tablet Take 40 mg by mouth See admin instructions. Takes 1 tablet in the morning 1/2 tablet at 2 pm then another 1/2 tablet at 6 pm    [provider]  gabapentin  (NEURONTIN) 300 MG capsule Take 300 mg by mouth 3 (three) times daily.    [provider]  losartan (COZAAR) 100 MG tablet Take 100 mg by mouth daily.    [provider]  metoprolol tartrate (LOPRESSOR) 100 MG tablet Take 100 mg by mouth daily.     [provider]      Allergies    Bee venom, Chromium, Gabapentin, and Sulfa antibiotics    Review of Systems   Review of Systems  Musculoskeletal:  Positive for joint swelling.    Physical Exam Updated Vital Signs BP (!) 166/70 (BP Location: Right Arm)   Pulse 77   Temp 98.1 F (36.7 C) (Oral)   Resp 16   SpO2 99%  Physical Exam Vitals and nursing note reviewed.  Constitutional:      Appearance: Normal appearance.  HENT:     Head: Atraumatic.  Cardiovascular:     Comments: Dorsalis pedis and posterior tibialis pulse of the RLE obtained with Doppler  Good cap refill of the toes of right foot Pulmonary:     Effort: Pulmonary effort is normal.  Musculoskeletal:     Comments: Right lower extremity:  General No obvious deformity. Diffuse edema of the right knee, much greater than when compared to left knee. No erythema, contusions, open wounds.  Calf soft and tender  Palpation  Tender palpation of the proximal and distal aspect of the femur and MCL.  No tenderness to palpation over the LCL Nontender along the tibia and fibula Nontender on the lateral and medial malleolus Non-tender of the popliteal fossa Non-tender of the patella  ROM Limited hip flexion.  Limited knee flexion and extension  Special tests No ligamentous laxity with anterior drawer testing or posterior drawer testing   Sensation: Sensation intact throughout the lower extremity  Strength: 5/5 strength with resisted ankle plantarflexion and dorsiflexion   Neurological:     General: No focal deficit present.     Mental Status: He is alert.  Psychiatric:        Mood and Affect: Mood normal.        Behavior: Behavior normal.      ED Results / Procedures / Treatments   Labs (all labs ordered are listed, but only abnormal results are displayed) Labs Reviewed - No data to display  EKG None  Radiology DG Femur Min 2 Views Right Result Date: 10/28/2023 CLINICAL DATA:  Right leg pain after fall. EXAM: RIGHT FEMUR 2 VIEWS COMPARISON:  None Available. FINDINGS: There is no evidence of fracture or other focal bone lesions. Soft tissues are unremarkable. IMPRESSION: Negative. Electronically Signed   By: Lupita Raider M.D.   On: 10/28/2023 13:47   DG Hip Unilat W or Wo Pelvis 2-3 Views Right Result Date: 10/28/2023 CLINICAL DATA:  Right hip pain after fall. EXAM: DG HIP (WITH OR WITHOUT PELVIS) 2-3V RIGHT COMPARISON:  July 15, 2020. FINDINGS: There is no evidence of hip fracture or dislocation. Mild narrowing and osteophyte formation of right hip is noted. IMPRESSION: Mild degenerative joint disease of right hip. No acute abnormality seen. Electronically Signed   By: Lupita Raider M.D.   On: 10/28/2023 13:46   DG Knee Complete 4 Views Right Result Date: 10/28/2023 CLINICAL DATA:  Acute on chronic right knee pain after fall. EXAM: RIGHT KNEE - COMPLETE 4+ VIEW COMPARISON:  None Available. FINDINGS: Small suprapatellar joint effusion is noted. Mild narrowing of patellofemoral space is noted with osteophyte formation. Severe narrowing of medial joint space is noted. Mild narrowing of lateral joint space is noted with osteophyte formation. IMPRESSION: Tricompartmental degenerative joint disease as noted above. Small suprapatellar joint effusion. No fracture or dislocation. Electronically Signed   By: Lupita Raider M.D.   On: 10/28/2023 13:43    Procedures Procedures    Medications Ordered in ED Medications  oxyCODONE (Oxy IR/ROXICODONE) immediate release tablet 5 mg (5 mg Oral Given 10/28/23 1002)    ED Course/ Medical Decision Making/ A&P                                 Medical Decision Making Amount  and/or Complexity of Data Reviewed Radiology: ordered.  Risk Prescription drug management.     Differential diagnosis includes but is not limited to fracture, dislocation, sprain, meniscal tear, compartment syndrome  ED Course:  Patient well-appearing, stable vital signs aside from a slightly elevated blood pressure 166/70 upon arrival.  He has mild tenderness to palpation of the proximal and distal femur, MCL on exam.  Limited range of motion of the hip and knee due to pain.  He is neurovascularly intact of the right lower extremity on exam, compartment soft, no concern for compartment syndrome at this time.   X-ray of the right hip, femur, knee did not show any  acute bony abnormality or dislocation.  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  I suspect he may have a ligament sprain versus tear given he reports pain " inside" the knee, not acutely tender over the bones.  Knee is too swollen to appreciate any ligamentous laxity at this time.   Upon re-evaluation, patient states the oxycodone given earlier today did not significantly help with his pain.  He is already taking Tylenol at home, although discussed that he can increase Tylenol dosage to 1000 mg every 6 hours.  Will avoid NSAID medications as he is on anticoagulation.  I discussed placing him in a hinged knee brace here in the ER today.  He states he has a hinged knee brace at home and declines getting a brace put on in the emergency room here today. He states he knows how to apply the brace and will be able to do this at home. He also reports he has a wheelchair at home he will use to help with ambulation.  He declines wheelchair here in the ER or crutches.  He already follows with his orthopedic provider for his arthritis.  States he will be able to follow-up with them regarding this knee injury.  He states his daughter will be able to drive him to a follow-up appointment with orthopedics.  Stable and appropriate for discharge home  at this time  Addendum 3:29 PM.  Patient's son came to bedside.  I updated son on the plan of care.  Patient now requests oxycodone to help with pain at home as pain is not well controlled with Tylenol and he cannot take NSAIDs.  Will send in short 3-day course to his pharmacy. Son will drive patient home and get him set up with wheelchair and knee brace at home  Impression: Right knee pain, likely secondary to ligament injury  Disposition:  The patient was discharged home with instructions to wear his hinged knee brace on the right knee at all times and use the wheelchair at home to help ambulate.  Follow-up with his orthopedic provider as soon as possible, within the next week, for further evaluation and management.  May take 1000 mg of Tylenol every 6 hours as needed for pain. Oxycodone for pain not controlled with Tylenol.  Avoid NSAIDs due to anticoagulation use. May ice the knee and use topical agents such as Voltaren gel for pain. Return precautions given.  Imaging Studies ordered: I ordered imaging studies including knee x-ray, femur x-ray, hip x-ray I independently visualized the imaging with scope of interpretation limited to determining acute life threatening conditions related to emergency care. Imaging showed  Tricompartmental osteoarthritis noted on right knee x-ray I agree with the radiologist interpretation               Final Clinical Impression(s) / ED Diagnoses Final diagnoses:  Acute pain of right knee    Rx / DC Orders ED Discharge Orders          Ordered    oxyCODONE (ROXICODONE) 5 MG immediate release tablet  Every 6 hours PRN        10/28/23 1532              Arabella Merles, PA-C 10/28/23 1441    Arabella Merles, PA-C 10/28/23 1534    Anders Simmonds T, DO 10/29/23 [phone]

## 2023-10-28 NOTE — ED Triage Notes (Signed)
 PT BIB EMS from [facility], assisted living.  Patient fell yesterday getting into a car, no injury other than his right knee.  Knee is painful and swollen.    AOX4 170/100 Resp 15 HR 82 98% RA

## 2023-10-28 NOTE — Discharge Instructions (Addendum)
 The x-rays of your right hip, femur, and knee did not show any fractures or dislocations.  It did show your known tricompartmental osteoarthritis.  I suspect you likely have a ligament injury as the cause of your pain.  As discussed, please apply the hinged knee brace to your right knee at home and keep this on at all times until cleared by orthopedics. Please use the wheelchair you have at home to help you get around.  Please schedule a follow-up appointment with your orthopedic provider within the next week for repeat evaluation and further management.  You may take up to 1000mg  of tylenol every 6 hours as needed for pain.  Do not take more then 4g per day.  You have been prescribed Oxycodone-this is a narcotic/controlled substance medication that has potential addicting qualities.  You may take 1 tablet every 6 hours as needed for severe pain not controlled with Tylenol.  Do not drive or operate heavy machinery when taking this medicine as it can be sedating. Do not drink alcohol or take other sedating medications when taking this medicine for safety reasons.  Keep this out of reach of small children.    As discussed, you may also apply ice to the knee to help with pain and swelling.  Please elevate the leg to help with swelling.  You may also use topical creams such as Voltaren gel to help with pain.  Return to the ER for any increased numbness or loss of sensation in your leg, severe uncontrolled pain, any other new or concerning symptoms

## 2024-04-03 ENCOUNTER — Emergency Department (HOSPITAL_COMMUNITY)

## 2024-04-03 ENCOUNTER — Other Ambulatory Visit: Payer: Self-pay

## 2024-04-03 ENCOUNTER — Inpatient Hospital Stay (HOSPITAL_COMMUNITY)
Admission: EM | Admit: 2024-04-03 | Discharge: 2024-04-17 | DRG: 853 | Disposition: E | Attending: Critical Care Medicine | Admitting: Critical Care Medicine

## 2024-04-03 DIAGNOSIS — Y846 Urinary catheterization as the cause of abnormal reaction of the patient, or of later complication, without mention of misadventure at the time of the procedure: Secondary | ICD-10-CM | POA: Diagnosis not present

## 2024-04-03 DIAGNOSIS — R310 Gross hematuria: Secondary | ICD-10-CM | POA: Diagnosis not present

## 2024-04-03 DIAGNOSIS — A419 Sepsis, unspecified organism: Principal | ICD-10-CM | POA: Diagnosis present

## 2024-04-03 DIAGNOSIS — Z6827 Body mass index (BMI) 27.0-27.9, adult: Secondary | ICD-10-CM

## 2024-04-03 DIAGNOSIS — G9349 Other encephalopathy: Secondary | ICD-10-CM | POA: Diagnosis present

## 2024-04-03 DIAGNOSIS — E1165 Type 2 diabetes mellitus with hyperglycemia: Secondary | ICD-10-CM | POA: Diagnosis present

## 2024-04-03 DIAGNOSIS — D6489 Other specified anemias: Secondary | ICD-10-CM | POA: Diagnosis not present

## 2024-04-03 DIAGNOSIS — I110 Hypertensive heart disease with heart failure: Secondary | ICD-10-CM | POA: Diagnosis present

## 2024-04-03 DIAGNOSIS — R6889 Other general symptoms and signs: Secondary | ICD-10-CM | POA: Diagnosis present

## 2024-04-03 DIAGNOSIS — E43 Unspecified severe protein-calorie malnutrition: Secondary | ICD-10-CM | POA: Diagnosis present

## 2024-04-03 DIAGNOSIS — R34 Anuria and oliguria: Secondary | ICD-10-CM | POA: Diagnosis not present

## 2024-04-03 DIAGNOSIS — Z515 Encounter for palliative care: Secondary | ICD-10-CM

## 2024-04-03 DIAGNOSIS — J189 Pneumonia, unspecified organism: Secondary | ICD-10-CM | POA: Diagnosis present

## 2024-04-03 DIAGNOSIS — K559 Vascular disorder of intestine, unspecified: Principal | ICD-10-CM | POA: Insufficient documentation

## 2024-04-03 DIAGNOSIS — Z882 Allergy status to sulfonamides status: Secondary | ICD-10-CM

## 2024-04-03 DIAGNOSIS — Z91030 Bee allergy status: Secondary | ICD-10-CM

## 2024-04-03 DIAGNOSIS — E785 Hyperlipidemia, unspecified: Secondary | ICD-10-CM | POA: Diagnosis present

## 2024-04-03 DIAGNOSIS — K529 Noninfective gastroenteritis and colitis, unspecified: Secondary | ICD-10-CM | POA: Diagnosis present

## 2024-04-03 DIAGNOSIS — R0689 Other abnormalities of breathing: Secondary | ICD-10-CM | POA: Diagnosis not present

## 2024-04-03 DIAGNOSIS — Z87891 Personal history of nicotine dependence: Secondary | ICD-10-CM

## 2024-04-03 DIAGNOSIS — Z66 Do not resuscitate: Secondary | ICD-10-CM | POA: Diagnosis not present

## 2024-04-03 DIAGNOSIS — Z7901 Long term (current) use of anticoagulants: Secondary | ICD-10-CM

## 2024-04-03 DIAGNOSIS — T8383XA Hemorrhage of genitourinary prosthetic devices, implants and grafts, initial encounter: Secondary | ICD-10-CM | POA: Diagnosis not present

## 2024-04-03 DIAGNOSIS — Z789 Other specified health status: Secondary | ICD-10-CM

## 2024-04-03 DIAGNOSIS — E876 Hypokalemia: Secondary | ICD-10-CM | POA: Diagnosis present

## 2024-04-03 DIAGNOSIS — I4819 Other persistent atrial fibrillation: Secondary | ICD-10-CM | POA: Diagnosis present

## 2024-04-03 DIAGNOSIS — R188 Other ascites: Secondary | ICD-10-CM | POA: Diagnosis present

## 2024-04-03 DIAGNOSIS — K650 Generalized (acute) peritonitis: Secondary | ICD-10-CM | POA: Diagnosis present

## 2024-04-03 DIAGNOSIS — I5032 Chronic diastolic (congestive) heart failure: Secondary | ICD-10-CM | POA: Diagnosis present

## 2024-04-03 DIAGNOSIS — E871 Hypo-osmolality and hyponatremia: Secondary | ICD-10-CM | POA: Diagnosis present

## 2024-04-03 DIAGNOSIS — C799 Secondary malignant neoplasm of unspecified site: Secondary | ICD-10-CM | POA: Diagnosis present

## 2024-04-03 DIAGNOSIS — Z951 Presence of aortocoronary bypass graft: Secondary | ICD-10-CM

## 2024-04-03 DIAGNOSIS — J180 Bronchopneumonia, unspecified organism: Secondary | ICD-10-CM | POA: Diagnosis present

## 2024-04-03 DIAGNOSIS — E872 Acidosis, unspecified: Secondary | ICD-10-CM | POA: Diagnosis present

## 2024-04-03 DIAGNOSIS — K55021 Focal (segmental) acute infarction of small intestine: Secondary | ICD-10-CM | POA: Diagnosis present

## 2024-04-03 DIAGNOSIS — J9601 Acute respiratory failure with hypoxia: Secondary | ICD-10-CM

## 2024-04-03 DIAGNOSIS — Z9911 Dependence on respirator [ventilator] status: Secondary | ICD-10-CM

## 2024-04-03 DIAGNOSIS — Z888 Allergy status to other drugs, medicaments and biological substances status: Secondary | ICD-10-CM

## 2024-04-03 DIAGNOSIS — C3491 Malignant neoplasm of unspecified part of right bronchus or lung: Secondary | ICD-10-CM | POA: Diagnosis present

## 2024-04-03 DIAGNOSIS — T4275XA Adverse effect of unspecified antiepileptic and sedative-hypnotic drugs, initial encounter: Secondary | ICD-10-CM | POA: Diagnosis not present

## 2024-04-03 DIAGNOSIS — Z79899 Other long term (current) drug therapy: Secondary | ICD-10-CM

## 2024-04-03 DIAGNOSIS — I2510 Atherosclerotic heart disease of native coronary artery without angina pectoris: Secondary | ICD-10-CM | POA: Diagnosis present

## 2024-04-03 DIAGNOSIS — K567 Ileus, unspecified: Secondary | ICD-10-CM | POA: Diagnosis present

## 2024-04-03 DIAGNOSIS — Z7982 Long term (current) use of aspirin: Secondary | ICD-10-CM

## 2024-04-03 DIAGNOSIS — D630 Anemia in neoplastic disease: Secondary | ICD-10-CM | POA: Diagnosis present

## 2024-04-03 DIAGNOSIS — Z8249 Family history of ischemic heart disease and other diseases of the circulatory system: Secondary | ICD-10-CM

## 2024-04-03 DIAGNOSIS — D62 Acute posthemorrhagic anemia: Secondary | ICD-10-CM | POA: Diagnosis not present

## 2024-04-03 DIAGNOSIS — I952 Hypotension due to drugs: Secondary | ICD-10-CM | POA: Diagnosis not present

## 2024-04-03 DIAGNOSIS — Z8719 Personal history of other diseases of the digestive system: Secondary | ICD-10-CM

## 2024-04-03 HISTORY — DX: Primary generalized (osteo)arthritis: M15.0

## 2024-04-03 LAB — COMPREHENSIVE METABOLIC PANEL WITH GFR
ALT: 17 U/L (ref 0–44)
AST: 16 U/L (ref 15–41)
Albumin: 3.2 g/dL — ABNORMAL LOW (ref 3.5–5.0)
Alkaline Phosphatase: 52 U/L (ref 38–126)
Anion gap: 13 (ref 5–15)
BUN: 14 mg/dL (ref 8–23)
CO2: 24 mmol/L (ref 22–32)
Calcium: 8.8 mg/dL — ABNORMAL LOW (ref 8.9–10.3)
Chloride: 87 mmol/L — ABNORMAL LOW (ref 98–111)
Creatinine, Ser: 0.48 mg/dL — ABNORMAL LOW (ref 0.61–1.24)
GFR, Estimated: >60 mL/min (ref >60)
Glucose, Bld: 171 mg/dL — ABNORMAL HIGH (ref 70–99)
Potassium: 3 mmol/L — ABNORMAL LOW (ref 3.5–5.1)
Sodium: 124 mmol/L — ABNORMAL LOW (ref 135–145)
Total Bilirubin: 1.4 mg/dL — ABNORMAL HIGH (ref 0.0–1.2)
Total Protein: 7 g/dL (ref 6.5–8.1)

## 2024-04-03 LAB — CBC
HCT: 35.5 % — ABNORMAL LOW (ref 39.0–52.0)
Hemoglobin: 12.2 g/dL — ABNORMAL LOW (ref 13.0–17.0)
MCH: 32.6 pg (ref 26.0–34.0)
MCHC: 34.4 g/dL (ref 30.0–36.0)
MCV: 94.9 fL (ref 80.0–100.0)
Platelets: 212 K/uL (ref 150–400)
RBC: 3.74 MIL/uL — ABNORMAL LOW (ref 4.22–5.81)
RDW: 11.8 % (ref 11.5–15.5)
WBC: 15.8 K/uL — ABNORMAL HIGH (ref 4.0–10.5)
nRBC: 0 % (ref 0.0–0.2)

## 2024-04-03 LAB — LIPASE, BLOOD: Lipase: 29 U/L (ref 11–51)

## 2024-04-03 MED ORDER — IOHEXOL 300 MG/ML  SOLN
100.0000 mL | Freq: Once | INTRAMUSCULAR | Status: AC | PRN
  Administered 2024-04-03: 100 mL via INTRAVENOUS

## 2024-04-03 NOTE — ED Triage Notes (Addendum)
 Pt says he started with diarrhea on Saturday, has been taking pepto bismol and probiotics. He said he could not eat much because it caused increased pain in the right side of his abdomen. Nausea. Denies fever, back pain or urinary symptoms. Pt says he last took his BP meds this morning.

## 2024-04-03 NOTE — ED Provider Triage Note (Signed)
 Emergency Medicine Provider Triage Evaluation Note  Dionel Archey , a 89 y.o. male  was evaluated in triage.  Pt complains of abdominal pain x 8 days along with diarrhea. Took pepto with some improvement in symptoms. Has also some nausea but no diarrhea. Had large bowel movement prior to arrival in the ED.   Review of Systems  Positive: Abdominal pain, nausea Negative: Vomiting, urinary symptoms, fever  Physical Exam  BP (!) 186/110 (BP Location: Right Arm)   Pulse 70   Temp 98.2 F (36.8 C)   Resp 18   SpO2 97%  Gen:   Awake, no distress   Resp:  Normal effort  MSK:   Moves extremities without difficulty  Other:  TTP along the RLQ  Medical Decision Making  Medically screening exam initiated at 8:06 PM.  Appropriate orders placed.  Lasalle Abee was informed that the remainder of the evaluation will be completed by another provider, this initial triage assessment does not replace that evaluation, and the importance of remaining in the ED until their evaluation is complete.     Curtiss Mahmood, PA-C 04/03/24 2008

## 2024-04-03 NOTE — ED Triage Notes (Signed)
 Pt arrives from [facility], per their report the patient is ambulatory with walker, since last Saturday having abdominal pain and diarrhea. Has RLQ pain, nausea, vomiting, no fever, no urinary changes. Unable to eat d/t the pain. 200/100, hx of HTN, took his night meds just prior to leaving to ED with EMS, HR 100, 96% RA, cbg 157.

## 2024-04-03 NOTE — ED Provider Notes (Signed)
 Received call from Dr. Crystal radiology, concerning CT ABDOMEN for ischemic small bowel. Concern for inflammation and colitis along with other findings.    Portions of this note were generated with Scientist, clinical (histocompatibility and immunogenetics). Dictation errors may occur despite best attempts at proofreading.     Thijs Brunton, PA-C 04/03/24 2352    Melvenia Motto, MD 04/04/24 [phone]

## 2024-04-04 ENCOUNTER — Encounter (HOSPITAL_COMMUNITY): Payer: Self-pay | Admitting: Internal Medicine

## 2024-04-04 DIAGNOSIS — K559 Vascular disorder of intestine, unspecified: Secondary | ICD-10-CM | POA: Diagnosis not present

## 2024-04-04 DIAGNOSIS — C3491 Malignant neoplasm of unspecified part of right bronchus or lung: Secondary | ICD-10-CM | POA: Diagnosis present

## 2024-04-04 DIAGNOSIS — T8383XA Hemorrhage of genitourinary prosthetic devices, implants and grafts, initial encounter: Secondary | ICD-10-CM | POA: Diagnosis not present

## 2024-04-04 DIAGNOSIS — R188 Other ascites: Secondary | ICD-10-CM | POA: Diagnosis present

## 2024-04-04 DIAGNOSIS — K567 Ileus, unspecified: Secondary | ICD-10-CM | POA: Diagnosis present

## 2024-04-04 DIAGNOSIS — K55021 Focal (segmental) acute infarction of small intestine: Secondary | ICD-10-CM | POA: Diagnosis present

## 2024-04-04 DIAGNOSIS — C799 Secondary malignant neoplasm of unspecified site: Secondary | ICD-10-CM | POA: Diagnosis present

## 2024-04-04 DIAGNOSIS — Z66 Do not resuscitate: Secondary | ICD-10-CM | POA: Diagnosis not present

## 2024-04-04 DIAGNOSIS — Z9889 Other specified postprocedural states: Secondary | ICD-10-CM | POA: Diagnosis not present

## 2024-04-04 DIAGNOSIS — K55059 Acute (reversible) ischemia of intestine, part and extent unspecified: Secondary | ICD-10-CM | POA: Diagnosis not present

## 2024-04-04 DIAGNOSIS — R34 Anuria and oliguria: Secondary | ICD-10-CM | POA: Diagnosis not present

## 2024-04-04 DIAGNOSIS — Z87891 Personal history of nicotine dependence: Secondary | ICD-10-CM | POA: Diagnosis not present

## 2024-04-04 DIAGNOSIS — K55069 Acute infarction of intestine, part and extent unspecified: Secondary | ICD-10-CM | POA: Diagnosis not present

## 2024-04-04 DIAGNOSIS — D630 Anemia in neoplastic disease: Secondary | ICD-10-CM | POA: Diagnosis present

## 2024-04-04 DIAGNOSIS — D72829 Elevated white blood cell count, unspecified: Secondary | ICD-10-CM | POA: Diagnosis not present

## 2024-04-04 DIAGNOSIS — K529 Noninfective gastroenteritis and colitis, unspecified: Secondary | ICD-10-CM | POA: Diagnosis present

## 2024-04-04 DIAGNOSIS — I5032 Chronic diastolic (congestive) heart failure: Secondary | ICD-10-CM | POA: Diagnosis present

## 2024-04-04 DIAGNOSIS — K650 Generalized (acute) peritonitis: Secondary | ICD-10-CM | POA: Diagnosis present

## 2024-04-04 DIAGNOSIS — E872 Acidosis, unspecified: Secondary | ICD-10-CM | POA: Diagnosis present

## 2024-04-04 DIAGNOSIS — Z9911 Dependence on respirator [ventilator] status: Secondary | ICD-10-CM | POA: Diagnosis not present

## 2024-04-04 DIAGNOSIS — E871 Hypo-osmolality and hyponatremia: Secondary | ICD-10-CM | POA: Diagnosis present

## 2024-04-04 DIAGNOSIS — J90 Pleural effusion, not elsewhere classified: Secondary | ICD-10-CM | POA: Diagnosis not present

## 2024-04-04 DIAGNOSIS — I4891 Unspecified atrial fibrillation: Secondary | ICD-10-CM | POA: Diagnosis not present

## 2024-04-04 DIAGNOSIS — J180 Bronchopneumonia, unspecified organism: Secondary | ICD-10-CM | POA: Diagnosis present

## 2024-04-04 DIAGNOSIS — I952 Hypotension due to drugs: Secondary | ICD-10-CM | POA: Diagnosis not present

## 2024-04-04 DIAGNOSIS — I4819 Other persistent atrial fibrillation: Secondary | ICD-10-CM | POA: Diagnosis present

## 2024-04-04 DIAGNOSIS — D62 Acute posthemorrhagic anemia: Secondary | ICD-10-CM | POA: Diagnosis not present

## 2024-04-04 DIAGNOSIS — I748 Embolism and thrombosis of other arteries: Secondary | ICD-10-CM | POA: Diagnosis not present

## 2024-04-04 DIAGNOSIS — A419 Sepsis, unspecified organism: Secondary | ICD-10-CM | POA: Diagnosis present

## 2024-04-04 DIAGNOSIS — E1165 Type 2 diabetes mellitus with hyperglycemia: Secondary | ICD-10-CM | POA: Diagnosis present

## 2024-04-04 DIAGNOSIS — Z515 Encounter for palliative care: Secondary | ICD-10-CM | POA: Diagnosis not present

## 2024-04-04 DIAGNOSIS — G9349 Other encephalopathy: Secondary | ICD-10-CM | POA: Diagnosis present

## 2024-04-04 DIAGNOSIS — J189 Pneumonia, unspecified organism: Secondary | ICD-10-CM | POA: Diagnosis present

## 2024-04-04 DIAGNOSIS — I110 Hypertensive heart disease with heart failure: Secondary | ICD-10-CM | POA: Diagnosis present

## 2024-04-04 DIAGNOSIS — I10 Essential (primary) hypertension: Secondary | ICD-10-CM | POA: Diagnosis not present

## 2024-04-04 DIAGNOSIS — Z95828 Presence of other vascular implants and grafts: Secondary | ICD-10-CM | POA: Diagnosis not present

## 2024-04-04 DIAGNOSIS — I2510 Atherosclerotic heart disease of native coronary artery without angina pectoris: Secondary | ICD-10-CM | POA: Diagnosis not present

## 2024-04-04 DIAGNOSIS — Y846 Urinary catheterization as the cause of abnormal reaction of the patient, or of later complication, without mention of misadventure at the time of the procedure: Secondary | ICD-10-CM | POA: Diagnosis not present

## 2024-04-04 DIAGNOSIS — E43 Unspecified severe protein-calorie malnutrition: Secondary | ICD-10-CM | POA: Diagnosis present

## 2024-04-04 LAB — URINALYSIS, ROUTINE W REFLEX MICROSCOPIC
Bilirubin Urine: NEGATIVE
Glucose, UA: NEGATIVE mg/dL
Hgb urine dipstick: NEGATIVE
Ketones, ur: 40 mg/dL — AB
Leukocytes,Ua: NEGATIVE
Nitrite: NEGATIVE
Protein, ur: NEGATIVE mg/dL
Specific Gravity, Urine: 1.01 (ref 1.005–1.030)
pH: 7 (ref 5.0–8.0)

## 2024-04-04 LAB — LACTIC ACID, PLASMA
Lactic Acid, Venous: 1.4 mmol/L (ref 0.5–1.9)
Lactic Acid, Venous: 0.9 mmol/L (ref 0.5–1.9)

## 2024-04-04 LAB — BASIC METABOLIC PANEL WITH GFR
Anion gap: 11 (ref 5–15)
Anion gap: 14 (ref 5–15)
BUN: 12 mg/dL (ref 8–23)
BUN: 11 mg/dL (ref 8–23)
CO2: 23 mmol/L (ref 22–32)
CO2: 22 mmol/L (ref 22–32)
Calcium: 8.1 mg/dL — ABNORMAL LOW (ref 8.9–10.3)
Calcium: 8.4 mg/dL — ABNORMAL LOW (ref 8.9–10.3)
Chloride: 95 mmol/L — ABNORMAL LOW (ref 98–111)
Chloride: 94 mmol/L — ABNORMAL LOW (ref 98–111)
Creatinine, Ser: 0.34 mg/dL — ABNORMAL LOW (ref 0.61–1.24)
Creatinine, Ser: 0.41 mg/dL — ABNORMAL LOW (ref 0.61–1.24)
GFR, Estimated: >60 mL/min (ref >60)
GFR, Estimated: >60 mL/min (ref >60)
Glucose, Bld: 114 mg/dL — ABNORMAL HIGH (ref 70–99)
Glucose, Bld: 119 mg/dL — ABNORMAL HIGH (ref 70–99)
Potassium: 3.6 mmol/L (ref 3.5–5.1)
Potassium: 3.1 mmol/L — ABNORMAL LOW (ref 3.5–5.1)
Sodium: 129 mmol/L — ABNORMAL LOW (ref 135–145)
Sodium: 130 mmol/L — ABNORMAL LOW (ref 135–145)

## 2024-04-04 LAB — MAGNESIUM: Magnesium: 1.6 mg/dL — ABNORMAL LOW (ref 1.7–2.4)

## 2024-04-04 LAB — CBC
HCT: 33.8 % — ABNORMAL LOW (ref 39.0–52.0)
Hemoglobin: 11 g/dL — ABNORMAL LOW (ref 13.0–17.0)
MCH: 31.3 pg (ref 26.0–34.0)
MCHC: 32.5 g/dL (ref 30.0–36.0)
MCV: 96.3 fL (ref 80.0–100.0)
Platelets: 207 K/uL (ref 150–400)
RBC: 3.51 MIL/uL — ABNORMAL LOW (ref 4.22–5.81)
RDW: 11.9 % (ref 11.5–15.5)
WBC: 17.7 K/uL — ABNORMAL HIGH (ref 4.0–10.5)
nRBC: 0 % (ref 0.0–0.2)

## 2024-04-04 LAB — MRSA NEXT GEN BY PCR, NASAL: MRSA by PCR Next Gen: NOT DETECTED

## 2024-04-04 MED ORDER — ONDANSETRON HCL 4 MG/2ML IJ SOLN: 4.0000 mg | Freq: Four times a day (QID) | INTRAMUSCULAR | Status: DC | PRN

## 2024-04-04 MED ORDER — METRONIDAZOLE 500 MG/100ML IV SOLN
500.0000 mg | Freq: Two times a day (BID) | INTRAVENOUS | Status: DC
  Administered 2024-04-04 – 2024-04-09 (×11): 500 mg via INTRAVENOUS
  Filled 2024-04-04 (×11): qty 100

## 2024-04-04 MED ORDER — ORAL CARE MOUTH RINSE: 15.0000 mL | OROMUCOSAL | Status: DC | PRN

## 2024-04-04 MED ORDER — ACETAMINOPHEN 650 MG RE SUPP: 650.0000 mg | Freq: Four times a day (QID) | RECTAL | Status: DC | PRN

## 2024-04-04 MED ORDER — ALBUTEROL SULFATE (2.5 MG/3ML) 0.083% IN NEBU: 2.5000 mg | INHALATION_SOLUTION | RESPIRATORY_TRACT | Status: DC | PRN

## 2024-04-04 MED ORDER — SODIUM CHLORIDE 0.9 % IV SOLN
2.0000 g | Freq: Once | INTRAVENOUS | Status: AC
  Administered 2024-04-04: 2 g via INTRAVENOUS
  Filled 2024-04-04: qty 12.5

## 2024-04-04 MED ORDER — GABAPENTIN 400 MG PO CAPS
400.0000 mg | ORAL_CAPSULE | Freq: Three times a day (TID) | ORAL | Status: DC
  Administered 2024-04-04 – 2024-04-05 (×4): 400 mg via ORAL
  Filled 2024-04-04 (×4): qty 1

## 2024-04-04 MED ORDER — SODIUM CHLORIDE 0.9 % IV SOLN: INTRAVENOUS | Status: AC

## 2024-04-04 MED ORDER — POTASSIUM CHLORIDE 10 MEQ/100ML IV SOLN: 10.0000 meq | INTRAVENOUS | Status: DC

## 2024-04-04 MED ORDER — HYDROMORPHONE HCL 1 MG/ML IJ SOLN
0.5000 mg | INTRAMUSCULAR | Status: DC | PRN
  Administered 2024-04-04 – 2024-04-05 (×5): 0.5 mg via INTRAVENOUS
  Filled 2024-04-04: qty 0.5
  Filled 2024-04-04: qty 1
  Filled 2024-04-04: qty 0.5
  Filled 2024-04-04: qty 1
  Filled 2024-04-04: qty 0.5

## 2024-04-04 MED ORDER — METOPROLOL SUCCINATE ER 25 MG PO TB24
100.0000 mg | ORAL_TABLET | Freq: Every day | ORAL | Status: DC
  Administered 2024-04-04 – 2024-04-05 (×2): 100 mg via ORAL
  Filled 2024-04-04: qty 2
  Filled 2024-04-04: qty 1

## 2024-04-04 MED ORDER — SODIUM CHLORIDE 0.9 % IV SOLN
2.0000 g | Freq: Three times a day (TID) | INTRAVENOUS | Status: DC
  Administered 2024-04-04: 2 g via INTRAVENOUS
  Filled 2024-04-04: qty 12.5

## 2024-04-04 MED ORDER — ONDANSETRON HCL 4 MG PO TABS: 4.0000 mg | ORAL_TABLET | Freq: Four times a day (QID) | ORAL | Status: DC | PRN

## 2024-04-04 MED ORDER — ENOXAPARIN SODIUM 40 MG/0.4ML IJ SOSY
40.0000 mg | PREFILLED_SYRINGE | INTRAMUSCULAR | Status: DC
  Administered 2024-04-04: 40 mg via SUBCUTANEOUS
  Filled 2024-04-04: qty 0.4

## 2024-04-04 MED ORDER — SODIUM CHLORIDE 0.9 % IV SOLN
2.0000 g | Freq: Two times a day (BID) | INTRAVENOUS | Status: DC
  Administered 2024-04-04 – 2024-04-09 (×10): 2 g via INTRAVENOUS
  Filled 2024-04-04 (×10): qty 12.5

## 2024-04-04 MED ORDER — SODIUM CHLORIDE 0.9 % IV SOLN: INTRAVENOUS | Status: DC

## 2024-04-04 MED ORDER — ACETAMINOPHEN 325 MG PO TABS: 650.0000 mg | ORAL_TABLET | Freq: Four times a day (QID) | ORAL | Status: DC | PRN

## 2024-04-04 MED ORDER — ONDANSETRON HCL 4 MG/2ML IJ SOLN
4.0000 mg | Freq: Once | INTRAMUSCULAR | Status: AC
  Administered 2024-04-04: 4 mg via INTRAVENOUS
  Filled 2024-04-04: qty 2

## 2024-04-04 MED ORDER — POTASSIUM CHLORIDE 10 MEQ/100ML IV SOLN
10.0000 meq | INTRAVENOUS | Status: AC
  Administered 2024-04-04 (×6): 10 meq via INTRAVENOUS
  Filled 2024-04-04 (×6): qty 100

## 2024-04-04 MED ORDER — LACTATED RINGERS IV BOLUS: 1000.0000 mL | Freq: Once | INTRAVENOUS | Status: DC

## 2024-04-04 MED ORDER — METRONIDAZOLE 500 MG/100ML IV SOLN
500.0000 mg | Freq: Once | INTRAVENOUS | Status: AC
  Administered 2024-04-04: 500 mg via INTRAVENOUS
  Filled 2024-04-04: qty 100

## 2024-04-04 MED ORDER — SODIUM CHLORIDE 0.9 % IV BOLUS
1000.0000 mL | Freq: Once | INTRAVENOUS | Status: AC
  Administered 2024-04-04: 1000 mL via INTRAVENOUS

## 2024-04-04 MED ORDER — POTASSIUM CHLORIDE 10 MEQ/100ML IV SOLN
10.0000 meq | INTRAVENOUS | Status: AC
  Administered 2024-04-04 (×4): 10 meq via INTRAVENOUS
  Filled 2024-04-04 (×4): qty 100

## 2024-04-04 MED ORDER — HYDROMORPHONE HCL 1 MG/ML IJ SOLN
0.5000 mg | Freq: Once | INTRAMUSCULAR | Status: AC
  Administered 2024-04-04: 0.5 mg via INTRAVENOUS
  Filled 2024-04-04: qty 1

## 2024-04-04 MED ORDER — TRAZODONE HCL 50 MG PO TABS: 25.0000 mg | ORAL_TABLET | Freq: Every evening | ORAL | Status: DC | PRN

## 2024-04-04 MED ORDER — ATORVASTATIN CALCIUM 40 MG PO TABS
40.0000 mg | ORAL_TABLET | Freq: Every day | ORAL | Status: DC
  Administered 2024-04-04: 40 mg via ORAL
  Filled 2024-04-04: qty 1

## 2024-04-04 MED ORDER — LOSARTAN POTASSIUM 50 MG PO TABS
100.0000 mg | ORAL_TABLET | Freq: Every day | ORAL | Status: DC
  Administered 2024-04-04: 100 mg via ORAL
  Filled 2024-04-04: qty 2

## 2024-04-04 MED ORDER — OXYCODONE HCL 5 MG PO TABS: 5.0000 mg | ORAL_TABLET | ORAL | Status: DC | PRN

## 2024-04-04 NOTE — H&P (Signed)
 History and Physical  Edsel Shives FMW:[id number] DOB: Oct 23, 1932 DOA: 04/03/2024  PCP: [facility]   Chief Complaint: Abdominal pain, diarrhea  HPI: Amanda Pote is a 89 y.o. male with medical history significant for atrial fibrillation on Eliquis , CAD status post CABG, hypertension, hyperlipidemia, prior appendicitis with microperforation being admitted to the hospital with 1 week of diarrhea and right lower quadrant abdominal pain.  History is provided by the patient, who lives with his wife in independent living.  He states that he was in his usual state of health until about 10 days ago, when he started having watery diarrhea 2 or 3 times a day, starting about a week ago he developed right sided abdominal pain, mild nausea without vomiting.  Denies any fevers, chills, chest pain, or blood in his stool.  He has been taking probiotics as well as Pepto-Bismol, otherwise denies any new medications, denies any antibiotic use in the last couple of weeks.  Review of Systems: Please see HPI for pertinent positives and negatives. A complete 10 system review of systems are otherwise negative.  Past Medical History:  Diagnosis Date   A-fib [facility])    Arrhythmia    CHF (congestive heart failure) (HCC)    Coronary artery disease    Hyperlipidemia    Hypertension    Past Surgical History:  Procedure Laterality Date   CARDIAC SURGERY     CORONARY ARTERY BYPASS GRAFT  1998   x 3; [facility]   Social History:  reports that he has quit smoking. He has a 17.5 pack-year smoking history. He has never used smokeless tobacco. He reports current alcohol use. He reports that he does not use drugs.  Allergies  Allergen Reactions   Bee Venom Other (See Comments)    Other reaction(s): fainting, decreased BP   Chromium Other (See Comments)    rash   Gabapentin  Other (See Comments)    Can't take capsules, tablets are tolerated 7/18 pt states allergy is resolved now, can take capsules     Sulfa Antibiotics Rash    Family History  Problem Relation Age of Onset   Heart attack Father    Heart attack Sister      Prior to Admission medications   Medication Sig Start Date End Date Taking? Authorizing Provider  apixaban  (ELIQUIS ) 5 MG TABS tablet Take 5 mg by mouth 2 (two) times daily.   Yes [provider]  Ascorbic Acid (VITAMIN C) 500 MG CAPS Take 500 capsules by mouth daily.   Yes [provider]  atorvastatin  (LIPITOR) 40 MG tablet Take 40 mg by mouth daily.   Yes [provider]  cholecalciferol (VITAMIN D3) 25 MCG (1000 UNIT) tablet Take 1,000 Units by mouth daily.   Yes [provider]  furosemide  (LASIX ) 40 MG tablet Take 20-40 mg by mouth See admin instructions. Takes 1 tablet in the morning 1/2 tablet at 2 pm then another 1/2 tablet at 6 pm   Yes [provider]  gabapentin  (NEURONTIN ) 800 MG tablet Take 400 mg by mouth 3 (three) times daily.   Yes [provider]  losartan  (COZAAR ) 100 MG tablet Take 100 mg by mouth daily.   Yes [provider]  metoprolol  succinate (TOPROL -XL) 100 MG 24 hr tablet Take 100 mg by mouth daily. 03/20/24  Yes [provider]    Physical Exam: BP (!) 145/89   Pulse 96   Temp 98.2 F (36.8 C) (Oral)   Resp 18   SpO2  97%  General:  Alert, oriented, calm, in no acute distress, resting comfortably on room air. Cardiovascular: RRR, no murmurs or rubs, no peripheral edema  Respiratory: clear to auscultation bilaterally, no wheezes, no crackles  Abdomen: soft, tender in the right lower quadrant, without guarding or rebound tenderness, nondistended, normal bowel tones heard  Skin: dry, no rashes  Musculoskeletal: no joint effusions, normal range of motion  Psychiatric: appropriate affect, normal speech  Neurologic: extraocular muscles intact, clear speech, moving all extremities with intact sensorium         Labs on Admission:  Basic Metabolic Panel: Recent Labs   Lab 04/03/24 2011  NA 124*  K 3.0*  CL 87*  CO2 24  GLUCOSE 171*  BUN 14  CREATININE 0.48*  CALCIUM  8.8*   Liver Function Tests: Recent Labs  Lab 04/03/24 2011  AST 16  ALT 17  ALKPHOS 52  BILITOT 1.4*  PROT 7.0  ALBUMIN  3.2*   Recent Labs  Lab 04/03/24 2011  LIPASE 29   No results for input(s): AMMONIA in the last 168 hours. CBC: Recent Labs  Lab 04/03/24 2011  WBC 15.8*  HGB 12.2*  HCT 35.5*  MCV 94.9  PLT 212   Cardiac Enzymes: No results for input(s): CKTOTAL, CKMB, CKMBINDEX, TROPONINI in the last 168 hours. BNP (last 3 results) No results for input(s): BNP in the last 8760 hours.  ProBNP (last 3 results) No results for input(s): PROBNP in the last 8760 hours.  CBG: No results for input(s): GLUCAP in the last 168 hours.  Radiological Exams on Admission: CT ABDOMEN PELVIS W CONTRAST Result Date: 04/03/2024 CLINICAL DATA:  Abdomen pain diarrhea EXAM: CT ABDOMEN AND PELVIS WITH CONTRAST TECHNIQUE: Multidetector CT imaging of the abdomen and pelvis was performed using the standard protocol following bolus administration of intravenous contrast. RADIATION DOSE REDUCTION: This exam was performed according to the departmental dose-optimization program which includes automated exposure control, adjustment of the mA and/or kV according to patient size and/or use of iterative reconstruction technique. CONTRAST:  OMNIPAQUE  IOHEXOL  300 MG/ML  SOLN COMPARISON:  CT 07/15/2020 FINDINGS: Lower chest: Lung bases demonstrate no acute airspace disease. Partially cystic lesion at right base with ground-glass nodularity at the periphery, series 5, image 6 and 7, measures about 16 x 10 mm on series 5, image 7, previously 13 x 11 mm. Hepatobiliary: Slightly lobulated liver morphology. Subcentimeter hypodensities in the left and right hepatic lobe too small to further characterize but stable and presumed benign. Slight focal hyperdensity inferior right hepatic  lobe series 2, image 27, without masslike features and possibly an area of altered perfusion. Overall heterogeneous liver parenchymal enhancement with vague areas of hyperenhancement. No biliary dilatation Pancreas: Unremarkable. No pancreatic ductal dilatation or surrounding inflammatory changes. Spleen: Normal in size without focal abnormality. Adrenals/Urinary Tract: Right adrenal gland demonstrates interval 18 mm nodule with density value of 104. Stable macroscopic fat containing left adrenal masses measuring 2.4 cm and 3.6 cm on series 2, image 18, consistent with myelolipomas. The kidneys show no hydronephrosis. Renal cysts and subcentimeter hypodensities too small to further characterize, no imaging follow-up is recommended. Urinary bladder is unremarkable Stomach/Bowel: The stomach is within normal limits. Mostly decompressed small bowel. Isolated short segment of abnormal appearing small bowel in the anterior pelvis, this segment of small bowel measures approximately 4.5 cm on coronal series 7, image 36, it exhibits wall thickening and hypoenhancement of the wall, series 2, image 55. There is associated mesenteric congestion and stranding. No perforation. No  pneumatosis. Scattered fluid within the colon. Suspicion of mild wall thickening involving the hepatic flexure and proximal transverse colon with mild mucosal enhancement. Diverticular disease of left colon. Favor collapsed appearance of the rectosigmoid colon over colitis. Mostly normal appearing appendix, but with cystic dilatation of the tip, coronal series 7 image 46, this could be due to sequela from prior perforated appendix. Difficult to exclude some fat stranding in the vicinity, series 7, image 48. Vascular/Lymphatic: Advanced aortic atherosclerosis. 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 No aneurysm. No suspicious lymph nodes Reproductive: Prostate slightly enlarged. Other: Negative  for pelvic effusion or free air. Small fat containing umbilical hernia. Musculoskeletal: Scoliosis and advanced multilevel degenerative changes. No acute osseous abnormality IMPRESSION: 1. Isolated short segment of abnormal appearing small bowel in the anterior pelvis with wall thickening and hypoenhancement of the wall, associated mesenteric congestion and stranding. Findings are suspicious for ischemic enteritis. No perforation or pneumatosis. 2. Mild wall thickening and mucosal enhancement involving the hepatic flexure and proximal transverse colon, suspect for colitis of infectious, inflammatory or ischemic etiology. Diverticular disease of left colon with collapsed appearance versus mild colitis type wall thickening. 3. Mostly normal appearing appendix, but with cystic dilatation of the tip, this could be due to sequela from prior perforated appendix. Difficult to exclude some fat stranding in the vicinity. Correlate for any signs or symptoms of acute appendicitis. 4. Interval 18 mm right adrenal nodule, indeterminate. Recommend nonemergent adrenal protocol CT or MRI. Stable left adrenal myelolipomas. 5. Partially cystic lesion at the right lung base with ground-glass nodularity at the periphery, slightly increased in size compared to prior. Suggest CT follow-up in 6 months for continued monitoring. 6. Heterogeneous liver parenchymal enhancement with vague areas of hyperenhancement When the patient is clinically stable and able to follow directions and hold their breath (preferably as an outpatient) further evaluation with dedicated abdominal MRI should be considered. 7. Aortic atherosclerosis. 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 Critical Value/emergent results were called by telephone at the time of interpretation on 04/03/2024 at 11:53 pm to provider [facility] , who verbally acknowledged these results. Aortic Atherosclerosis (ICD10-I70.0).  Electronically Signed   By: Luke Bun M.D.   On: 04/03/2024 23:53   Assessment/Plan Abb Gobert is a 89 y.o. male with medical history significant for atrial fibrillation on Eliquis , CAD status post CABG, hypertension, hyperlipidemia, prior appendicitis with microperforation being admitted to the hospital with 1 week of diarrhea and right lower quadrant abdominal pain.   Sepsis due to colitis-with evidence of small bowel enteritis, possibly ischemic as well as infectious versus inflammatory colitis.  Etiology is unclear at this point could be related to microscopic ischemia, infection or inflammatory colitis.  Patient is hemodynamically stable, without evidence of perforation, abscess or other complication.  Meeting sepsis criteria with tachycardia, leukocytosis, suspected intra-abdominal infection. -Inpatient admission -Monitor on progressive unit -Clear liquid diet -Empiric IV Flagyl  and IV cefepime  -General Surgery consultation appreciated, await formal recommendations  Hyponatremia-I suspect this is a hypovolemic hyponatremia, in the setting of dehydration from his diarrhea for the past 10 days -Continue hydration -Monitor BMP every 8 hours  Hypokalemia-undoubtedly due to GI losses from his diarrhea, has been repleted in the ER and will be monitored  Hypertension-continue home Cozaar   Persistent A-fib-continue Toprol -XL, hold Eliquis  in case of surgical intervention  Heart failure with preserved EF-patient appears euvolemic, will plan for intermittent  Lasix  as clinically indicated  DVT prophylaxis: Lovenox      Code Status: Full Code  Consults called: General Surgery  Admission status: The appropriate patient status for this patient is INPATIENT. Inpatient status is judged to be reasonable and necessary in order to provide the required intensity of service to ensure the patient's safety. The patient's presenting symptoms, physical exam findings, and initial radiographic and  laboratory data in the context of their chronic comorbidities is felt to place them at high risk for further clinical deterioration. Furthermore, it is not anticipated that the patient will be medically stable for discharge from the hospital within 2 midnights of admission.    I certify that at the point of admission it is my clinical judgment that the patient will require inpatient hospital care spanning beyond 2 midnights from the point of admission due to high intensity of service, high risk for further deterioration and high frequency of surveillance required  Time spent: 59 minutes  Richie Bonanno CHRISTELLA Gail MD [facility] Pager [phone]  If 7PM-7AM, please contact night-coverage [email or web address] Password [facility]  04/04/2024, 8:27 AM

## 2024-04-04 NOTE — ED Provider Notes (Signed)
  EMERGENCY DEPARTMENT AT [facility] Provider Note   CSN: [id number] Arrival date & time: 04/03/24  1948     Patient presents with: Abdominal Pain and Diarrhea   Donald [name] is a 89 y.o. male.    Abdominal Pain Associated symptoms: diarrhea and nausea   Diarrhea Associated symptoms: abdominal pain   Patient presents for abdominal pain.  Medical history includes CHF, HTN, CAD, HLD, atrial fibrillation.  He is on Eliquis .  For the past 7 days, he has had right sided abdominal pain with nonbloody diarrhea.  Pain is worsened postprandially.  He has been taking Pepto-Bismol and probiotics without relief.  He has had nausea without vomiting.  Current pain is 6/10 in severity.  He does endorse current nausea.     Prior to Admission medications   Medication Sig Start Date End Date Taking? Authorizing Provider  apixaban  (ELIQUIS ) 5 MG TABS tablet Take 5 mg by mouth 2 (two) times daily.   Yes [provider]  Ascorbic Acid (VITAMIN C) 500 MG CAPS Take 500 capsules by mouth daily.   Yes [provider]  atorvastatin  (LIPITOR) 40 MG tablet Take 40 mg by mouth daily.   Yes [provider]  cholecalciferol (VITAMIN D3) 25 MCG (1000 UNIT) tablet Take 1,000 Units by mouth daily.   Yes [provider]  furosemide  (LASIX ) 40 MG tablet Take 20-40 mg by mouth See admin instructions. Takes 1 tablet in the morning 1/2 tablet at 2 pm then another 1/2 tablet at 6 pm   Yes [provider]  gabapentin  (NEURONTIN ) 800 MG tablet Take 400 mg by mouth 3 (three) times daily.   Yes [provider]  losartan  (COZAAR ) 100 MG tablet Take 100 mg by mouth daily.   Yes [provider]  metoprolol  succinate (TOPROL -XL) 100 MG 24 hr tablet Take 100 mg by mouth daily. 03/20/24  Yes [provider]    Allergies: Bee venom, Chromium, Gabapentin , and Sulfa antibiotics    Review of Systems  Gastrointestinal:  Positive for abdominal  pain, diarrhea and nausea.  All other systems reviewed and are negative.   Updated Vital Signs BP (!) 145/89   Pulse 96   Temp 98.2 F (36.8 C) (Oral)   Resp 18   SpO2 97%   Physical Exam Vitals and nursing note reviewed.  Constitutional:      General: He is not in acute distress.    Appearance: He is well-developed. He is not ill-appearing, toxic-appearing or diaphoretic.  HENT:     Head: Normocephalic and atraumatic.     Mouth/Throat:     Mouth: Mucous membranes are moist.  Eyes:     Extraocular Movements: Extraocular movements intact.     Conjunctiva/sclera: Conjunctivae normal.  Cardiovascular:     Rate and Rhythm: Regular rhythm. Tachycardia present.  Pulmonary:     Effort: Pulmonary effort is normal. No respiratory distress.  Abdominal:     Palpations: Abdomen is soft.     Tenderness: There is abdominal tenderness in the right upper quadrant and right lower quadrant. There is no guarding or rebound.  Musculoskeletal:        General: No swelling.     Cervical back: Neck supple.  Skin:    General: Skin is warm and dry.     Coloration: Skin is not cyanotic, jaundiced or pale.  Neurological:     General: No focal deficit present.     Mental Status: He is alert and oriented to person,  place, and time.  Psychiatric:        Mood and Affect: Mood normal.        Behavior: Behavior normal.     (all labs ordered are listed, but only abnormal results are displayed) Labs Reviewed  COMPREHENSIVE METABOLIC PANEL WITH GFR - Abnormal; Notable for the following components:      Result Value   Sodium 124 (*)    Potassium 3.0 (*)    Chloride 87 (*)    Glucose, Bld 171 (*)    Creatinine, Ser 0.48 (*)    Calcium  8.8 (*)    Albumin  3.2 (*)    Total Bilirubin 1.4 (*)    All other components within normal limits  CBC - Abnormal; Notable for the following components:   WBC 15.8 (*)    RBC 3.74 (*)    Hemoglobin 12.2 (*)    HCT 35.5 (*)    All other components within normal  limits  GASTROINTESTINAL PANEL BY PCR, STOOL (REPLACES STOOL CULTURE)  LIPASE, BLOOD  LACTIC ACID, PLASMA  URINALYSIS, ROUTINE W REFLEX MICROSCOPIC  LACTIC ACID, PLASMA  CBC  BASIC METABOLIC PANEL WITH GFR    EKG: None  Radiology: CT ABDOMEN PELVIS W CONTRAST Result Date: 04/03/2024 CLINICAL DATA:  Abdomen pain diarrhea EXAM: CT ABDOMEN AND PELVIS WITH CONTRAST TECHNIQUE: Multidetector CT imaging of the abdomen and pelvis was performed using the standard protocol following bolus administration of intravenous contrast. RADIATION DOSE REDUCTION: This exam was performed according to the departmental dose-optimization program which includes automated exposure control, adjustment of the mA and/or kV according to patient size and/or use of iterative reconstruction technique. CONTRAST:  OMNIPAQUE  IOHEXOL  300 MG/ML  SOLN COMPARISON:  CT 07/15/2020 FINDINGS: Lower chest: Lung bases demonstrate no acute airspace disease. Partially cystic lesion at right base with ground-glass nodularity at the periphery, series 5, image 6 and 7, measures about 16 x 10 mm on series 5, image 7, previously 13 x 11 mm. Hepatobiliary: Slightly lobulated liver morphology. Subcentimeter hypodensities in the left and right hepatic lobe too small to further characterize but stable and presumed benign. Slight focal hyperdensity inferior right hepatic lobe series 2, image 27, without masslike features and possibly an area of altered perfusion. Overall heterogeneous liver parenchymal enhancement with vague areas of hyperenhancement. No biliary dilatation Pancreas: Unremarkable. No pancreatic ductal dilatation or surrounding inflammatory changes. Spleen: Normal in size without focal abnormality. Adrenals/Urinary Tract: Right adrenal gland demonstrates interval 18 mm nodule with density value of 104. Stable macroscopic fat containing left adrenal masses measuring 2.4 cm and 3.6 cm on series 2, image 18, consistent with myelolipomas.  The kidneys show no hydronephrosis. Renal cysts and subcentimeter hypodensities too small to further characterize, no imaging follow-up is recommended. Urinary bladder is unremarkable Stomach/Bowel: The stomach is within normal limits. Mostly decompressed small bowel. Isolated short segment of abnormal appearing small bowel in the anterior pelvis, this segment of small bowel measures approximately 4.5 cm on coronal series 7, image 36, it exhibits wall thickening and hypoenhancement of the wall, series 2, image 55. There is associated mesenteric congestion and stranding. No perforation. No pneumatosis. Scattered fluid within the colon. Suspicion of mild wall thickening involving the hepatic flexure and proximal transverse colon with mild mucosal enhancement. Diverticular disease of left colon. Favor collapsed appearance of the rectosigmoid colon over colitis. Mostly normal appearing appendix, but with cystic dilatation of the tip, coronal series 7 image 46, this could be due to sequela from prior perforated appendix. Difficult  to exclude some fat stranding in the vicinity, series 7, image 48. Vascular/Lymphatic: Advanced aortic atherosclerosis. 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 No aneurysm. No suspicious lymph nodes Reproductive: Prostate slightly enlarged. Other: Negative for pelvic effusion or free air. Small fat containing umbilical hernia. Musculoskeletal: Scoliosis and advanced multilevel degenerative changes. No acute osseous abnormality IMPRESSION: 1. Isolated short segment of abnormal appearing small bowel in the anterior pelvis with wall thickening and hypoenhancement of the wall, associated mesenteric congestion and stranding. Findings are suspicious for ischemic enteritis. No perforation or pneumatosis. 2. Mild wall thickening and mucosal enhancement involving the hepatic flexure and proximal transverse colon, suspect for colitis of  infectious, inflammatory or ischemic etiology. Diverticular disease of left colon with collapsed appearance versus mild colitis type wall thickening. 3. Mostly normal appearing appendix, but with cystic dilatation of the tip, this could be due to sequela from prior perforated appendix. Difficult to exclude some fat stranding in the vicinity. Correlate for any signs or symptoms of acute appendicitis. 4. Interval 18 mm right adrenal nodule, indeterminate. Recommend nonemergent adrenal protocol CT or MRI. Stable left adrenal myelolipomas. 5. Partially cystic lesion at the right lung base with ground-glass nodularity at the periphery, slightly increased in size compared to prior. Suggest CT follow-up in 6 months for continued monitoring. 6. Heterogeneous liver parenchymal enhancement with vague areas of hyperenhancement When the patient is clinically stable and able to follow directions and hold their breath (preferably as an outpatient) further evaluation with dedicated abdominal MRI should be considered. 7. Aortic atherosclerosis. 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 Critical Value/emergent results were called by telephone at the time of interpretation on 04/03/2024 at 11:53 pm to provider [facility] , who verbally acknowledged these results. Aortic Atherosclerosis (ICD10-I70.0). Electronically Signed   By: Luke Bun M.D.   On: 04/03/2024 23:53     Procedures   Medications Ordered in the ED  HYDROmorphone  (DILAUDID ) injection 0.5 mg (0.5 mg Intravenous Given 04/04/24 0738)  0.9 %  sodium chloride  infusion ( Intravenous New Bag/Given 04/04/24 0115)  iohexol  (OMNIPAQUE ) 300 MG/ML solution 100 mL (100 mLs Intravenous Contrast Given 04/03/24 2301)  HYDROmorphone  (DILAUDID ) injection 0.5 mg (0.5 mg Intravenous Given 04/04/24 0034)  ondansetron  (ZOFRAN ) injection 4 mg (4 mg Intravenous Given 04/04/24 0034)  sodium chloride  0.9 % bolus 1,000 mL (0  mLs Intravenous Stopped 04/04/24 0144)  ceFEPIme  (MAXIPIME ) 2 g in sodium chloride  0.9 % 100 mL IVPB (0 g Intravenous Stopped 04/04/24 0107)  metroNIDAZOLE  (FLAGYL ) IVPB 500 mg (0 mg Intravenous Stopped 04/04/24 0137)  potassium chloride  10 mEq in 100 mL IVPB (0 mEq Intravenous Stopped 04/04/24 0516)                                    Medical Decision Making Amount and/or Complexity of Data Reviewed Labs: ordered.  Risk Prescription drug management. Decision regarding hospitalization.   This patient presents to the ED for concern of abdominal pain, this involves an extensive number of treatment options, and is a complaint that carries with it a high risk of complications and morbidity.  The differential diagnosis includes bowel ischemia, SBO, constipation, colitis, appendicitis, cholecystitis   Co morbidities / Chronic conditions that complicate the patient evaluation  CHF, HTN, CAD, HLD, atrial fibrillation   Additional history obtained:  Additional history  obtained from EMR External records from outside source obtained and reviewed including N/A   Lab Tests:  I Ordered, and personally interpreted labs.  The pertinent results include: Leukocytosis is present.  Anemia is baseline.  Hypokalemia and hyponatremia are present.  Lactate is normal.   Imaging Studies ordered:  I ordered imaging studies including CT of the abdomen and pelvis I independently visualized and interpreted imaging which showed concern for ischemic enteritis, questionable appendicitis I agree with the radiologist interpretation   Cardiac Monitoring: / EKG:  The patient was maintained on a cardiac monitor.  I personally viewed and interpreted the cardiac monitored which showed an underlying rhythm of: Sinus rhythm   Problem List / ED Course / Critical interventions / Medication management  Patient presenting for right-sided abdominal pain over the past week.  On arrival, vital signs notable for  hypertension and tachycardia.  On exam, he is overall well-appearing.  He has tenderness to right side of abdomen.  He feels that this area seems bloated to him.  His abdomen is soft and without guarding.  Dilaudid  was ordered for analgesia.  Zofran  was ordered for nausea.  Patient underwent CT imaging which showed concern of ischemic enteritis.  I spoke with general surgeon on-call, Dr. Stevie, who request admission to hospitalist.  Surgery team will see in the morning.  Patient was covered with cefepime  and Flagyl .  Replacement potassium was ordered for hypokalemia.  Patient to be kept NPO.  He was admitted for further management. I ordered medication including Dilaudid  for analgesia, Zofran  for nausea, IV fluids for hydration, Flagyl  and cefepime  for concern of intra-abdominal infection, potassium chloride  for hypokalemia Reevaluation of the patient after these medicines showed that the patient improved I have reviewed the patients home medicines and have made adjustments as needed   Consultations Obtained:  I requested consultation with the general surgeon, Dr. Stevie,  and discussed lab and imaging findings as well as pertinent plan - they recommend: Admission to medicine, surgery will see in the morning.   Social Determinants of Health:  Has PCP     Final diagnoses:  Ischemic enteritis [facility])    ED Discharge Orders     None          Melvenia Motto, MD 04/04/24 [phone]

## 2024-04-04 NOTE — Consult Note (Signed)
 Consult Note  Donald [name] Dec 12, 1932  [id number].    Requesting MD: Bernardino Fireman, MD Chief Complaint/Reason for Consult: Enteritis  HPI:  Patient is an 89 year old male who presented to the ED yesterday evening with diarrhea and abdominal pain. Patient started having diarrhea last Saturday 8/9 and continues to have watery stools that are not bloody. He tried taking some pepto bismol and probiotics but has not improved. Since the onset of the diarrhea he has also developed abdominal bloating and pain related to this. Pain is mostly around his umbilicus and RLQ. He reports associated nausea but no vomiting. He has been trying to take in liquids but has not been able to eat much due to poor appetite. He denies sick contacts, recent travel or suspicious food ingestion. Denies any recent antibiotics. He has not had any similar but less severe symptoms in the last year. He has a history of perforated appendicitis that was treated with antibiotics in 2021 but he did not have an interval appendectomy ever. No prior abdominal surgery otherwise. PMH otherwise significant for A. Fib on Eliquis  (LD yesterday evening), CAD s/p CABG, ?CHF, HTN, HLD. Allergic to gabapentin , sulfa containing drugs and chromium. He lives in an independent living facility.   ROS: Negative other than HPI  Family History  Problem Relation Age of Onset   Heart attack Father    Heart attack Sister     Past Medical History:  Diagnosis Date   A-fib [facility])    Arrhythmia    CHF (congestive heart failure) (HCC)    Coronary artery disease    Hyperlipidemia    Hypertension     Past Surgical History:  Procedure Laterality Date   CARDIAC SURGERY     CORONARY ARTERY BYPASS GRAFT  1998   x 3; [facility]    Social History:  reports that he has quit smoking. He has a 17.5 pack-year smoking history. He has never used smokeless tobacco. He reports current alcohol use. He reports that he does not use drugs.  Allergies:   Allergies  Allergen Reactions   Bee Venom Other (See Comments)    Other reaction(s): fainting, decreased BP   Chromium Other (See Comments)    rash   Gabapentin  Other (See Comments)    Can't take capsules, tablets are tolerated 7/18 pt states allergy is resolved now, can take capsules    Sulfa Antibiotics Rash    (Not in a hospital admission)   Blood pressure (!) 176/99, pulse 98, temperature 98.2 F (36.8 C), temperature source Oral, resp. rate 16, SpO2 96%. Physical Exam:  General: pleasant, WD, WN male who is laying in bed in NAD HEENT: head is normocephalic, atraumatic.  Sclera are noninjected.  Pupils equal and round.  Ears and nose without any masses or lesions.  Mouth is pink and dry Heart: irregularly irregular but rate in the 90-100s. Palpable radial and pedal pulses bilaterally Lungs: No wheezes, rhonchi, or rales noted.  Respiratory effort nonlabored Abd: soft, TTP around umbilicus and RLQ without peritonitis, mild distention, no involuntary/voluntary guarding  MS: all 4 extremities are symmetrical with no cyanosis, clubbing, or edema. Skin: warm and dry with no masses, lesions, or rashes Neuro: Cranial nerves 2-12 grossly intact, sensation is normal throughout Psych: A&Ox3 with an appropriate affect.   Results for orders placed or performed during the hospital encounter of 04/03/24 (from the past 48 hours)  Lipase, blood     Status: None   Collection Time: 04/03/24  8:11 PM  Result Value Ref Range   Lipase 29 11 - 51 U/L    Comment: Performed at [facility], [address], [place], [place] [address]  Comprehensive metabolic panel     Status: Abnormal   Collection Time: 04/03/24  8:11 PM  Result Value Ref Range   Sodium 124 (L) 135 - 145 mmol/L   Potassium 3.0 (L) 3.5 - 5.1 mmol/L   Chloride 87 (L) 98 - 111 mmol/L   CO2 24 22 - 32 mmol/L   Glucose, Bld 171 (H) 70 - 99 mg/dL    Comment: Glucose reference range applies only to samples taken  after fasting for at least 8 hours.   BUN 14 8 - 23 mg/dL   Creatinine, Ser 9.51 (L) 0.61 - 1.24 mg/dL   Calcium  8.8 (L) 8.9 - 10.3 mg/dL   Total Protein 7.0 6.5 - 8.1 g/dL   Albumin  3.2 (L) 3.5 - 5.0 g/dL   AST 16 15 - 41 U/L   ALT 17 0 - 44 U/L   Alkaline Phosphatase 52 38 - 126 U/L   Total Bilirubin 1.4 (H) 0.0 - 1.2 mg/dL   GFR, Estimated >39 >39 mL/min    Comment: (NOTE) Calculated using the CKD-EPI Creatinine Equation (2021)    Anion gap 13 5 - 15    Comment: Performed at [facility], [address] [place], [place] [address]  CBC     Status: Abnormal   Collection Time: 04/03/24  8:11 PM  Result Value Ref Range   WBC 15.8 (H) 4.0 - 10.5 K/uL   RBC 3.74 (L) 4.22 - 5.81 MIL/uL   Hemoglobin 12.2 (L) 13.0 - 17.0 g/dL   HCT 64.4 (L) 60.9 - 47.9 %   MCV 94.9 80.0 - 100.0 fL   MCH 32.6 26.0 - 34.0 pg   MCHC 34.4 30.0 - 36.0 g/dL   RDW 88.1 88.4 - 84.4 %   Platelets 212 150 - 400 K/uL   nRBC 0.0 0.0 - 0.2 %    Comment: Performed at [facility], [address] [place], [place] [address]  Lactic acid, plasma     Status: None   Collection Time: 04/04/24  1:41 AM  Result Value Ref Range   Lactic Acid, Venous 1.4 0.5 - 1.9 mmol/L    Comment: Performed at [facility], [address], [place], [place] [address]  Urinalysis, Routine w reflex microscopic -Urine, Clean Catch     Status: Abnormal   Collection Time: 04/04/24  7:46 AM  Result Value Ref Range   Color, Urine YELLOW YELLOW   APPearance CLEAR CLEAR   Specific Gravity, Urine 1.010 1.005 - 1.030   pH 7.0 5.0 - 8.0   Glucose, UA NEGATIVE NEGATIVE mg/dL   Hgb urine dipstick NEGATIVE NEGATIVE   Bilirubin Urine NEGATIVE NEGATIVE   Ketones, ur 40 (A) NEGATIVE mg/dL   Protein, ur NEGATIVE NEGATIVE mg/dL   Nitrite NEGATIVE NEGATIVE   Leukocytes,Ua NEGATIVE NEGATIVE    Comment: Microscopic not done on urines with negative protein, blood, leukocytes, nitrite, or glucose  < 500 mg/dL. Performed at [facility], [address] [place], [place] [address]   CBC     Status: Abnormal   Collection Time: 04/04/24  8:50 AM  Result Value Ref Range   WBC 17.7 (H) 4.0 - 10.5 K/uL   RBC 3.51 (L) 4.22 - 5.81 MIL/uL   Hemoglobin 11.0 (L) 13.0 - 17.0 g/dL  HCT 33.8 (L) 39.0 - 52.0 %   MCV 96.3 80.0 - 100.0 fL   MCH 31.3 26.0 - 34.0 pg   MCHC 32.5 30.0 - 36.0 g/dL   RDW 88.0 88.4 - 84.4 %   Platelets 207 150 - 400 K/uL   nRBC 0.0 0.0 - 0.2 %    Comment: Performed at [facility], [address] [place], [place] [address]   CT ABDOMEN PELVIS W CONTRAST Result Date: 04/03/2024 CLINICAL DATA:  Abdomen pain diarrhea EXAM: CT ABDOMEN AND PELVIS WITH CONTRAST TECHNIQUE: Multidetector CT imaging of the abdomen and pelvis was performed using the standard protocol following bolus administration of intravenous contrast. RADIATION DOSE REDUCTION: This exam was performed according to the departmental dose-optimization program which includes automated exposure control, adjustment of the mA and/or kV according to patient size and/or use of iterative reconstruction technique. CONTRAST:  OMNIPAQUE  IOHEXOL  300 MG/ML  SOLN COMPARISON:  CT 07/15/2020 FINDINGS: Lower chest: Lung bases demonstrate no acute airspace disease. Partially cystic lesion at right base with ground-glass nodularity at the periphery, series 5, image 6 and 7, measures about 16 x 10 mm on series 5, image 7, previously 13 x 11 mm. Hepatobiliary: Slightly lobulated liver morphology. Subcentimeter hypodensities in the left and right hepatic lobe too small to further characterize but stable and presumed benign. Slight focal hyperdensity inferior right hepatic lobe series 2, image 27, without masslike features and possibly an area of altered perfusion. Overall heterogeneous liver parenchymal enhancement with vague areas of hyperenhancement. No biliary dilatation Pancreas: Unremarkable. No  pancreatic ductal dilatation or surrounding inflammatory changes. Spleen: Normal in size without focal abnormality. Adrenals/Urinary Tract: Right adrenal gland demonstrates interval 18 mm nodule with density value of 104. Stable macroscopic fat containing left adrenal masses measuring 2.4 cm and 3.6 cm on series 2, image 18, consistent with myelolipomas. The kidneys show no hydronephrosis. Renal cysts and subcentimeter hypodensities too small to further characterize, no imaging follow-up is recommended. Urinary bladder is unremarkable Stomach/Bowel: The stomach is within normal limits. Mostly decompressed small bowel. Isolated short segment of abnormal appearing small bowel in the anterior pelvis, this segment of small bowel measures approximately 4.5 cm on coronal series 7, image 36, it exhibits wall thickening and hypoenhancement of the wall, series 2, image 55. There is associated mesenteric congestion and stranding. No perforation. No pneumatosis. Scattered fluid within the colon. Suspicion of mild wall thickening involving the hepatic flexure and proximal transverse colon with mild mucosal enhancement. Diverticular disease of left colon. Favor collapsed appearance of the rectosigmoid colon over colitis. Mostly normal appearing appendix, but with cystic dilatation of the tip, coronal series 7 image 46, this could be due to sequela from prior perforated appendix. Difficult to exclude some fat stranding in the vicinity, series 7, image 48. Vascular/Lymphatic: Advanced aortic atherosclerosis. 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 No aneurysm. No suspicious lymph nodes Reproductive: Prostate slightly enlarged. Other: Negative for pelvic effusion or free air. Small fat containing umbilical hernia. Musculoskeletal: Scoliosis and advanced multilevel degenerative changes. No acute osseous abnormality IMPRESSION: 1. Isolated short segment of abnormal  appearing small bowel in the anterior pelvis with wall thickening and hypoenhancement of the wall, associated mesenteric congestion and stranding. Findings are suspicious for ischemic enteritis. No perforation or pneumatosis. 2. Mild wall thickening and mucosal enhancement involving the hepatic flexure and proximal transverse colon, suspect for colitis of infectious, inflammatory or ischemic etiology. Diverticular disease of left  colon with collapsed appearance versus mild colitis type wall thickening. 3. Mostly normal appearing appendix, but with cystic dilatation of the tip, this could be due to sequela from prior perforated appendix. Difficult to exclude some fat stranding in the vicinity. Correlate for any signs or symptoms of acute appendicitis. 4. Interval 18 mm right adrenal nodule, indeterminate. Recommend nonemergent adrenal protocol CT or MRI. Stable left adrenal myelolipomas. 5. Partially cystic lesion at the right lung base with ground-glass nodularity at the periphery, slightly increased in size compared to prior. Suggest CT follow-up in 6 months for continued monitoring. 6. Heterogeneous liver parenchymal enhancement with vague areas of hyperenhancement When the patient is clinically stable and able to follow directions and hold their breath (preferably as an outpatient) further evaluation with dedicated abdominal MRI should be considered. 7. Aortic atherosclerosis. 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 Critical Value/emergent results were called by telephone at the time of interpretation on 04/03/2024 at 11:53 pm to provider [facility] , who verbally acknowledged these results. Aortic Atherosclerosis (ICD10-I70.0). Electronically Signed   By: Luke Bun M.D.   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 - 8 day hx of diarrhea - recommend GI panel  - WBC 17 from 15, afebrile; lactic was 1.4 early this AM, repeat ordered - pt with TTP in RLQ but not peritonitic - would recommend supportive measures and bowel rest now, agree with IV abx  - monitor abdominal exam closely, general surgery will follow   Admit to internal medicine   FEN: ice chips, IVF per [facility] VTE: Hold Eliquis , LD was 8/18 PM ID: [email or web address]   - per [facility] -  A. Fib on Eliquis  CAD s/p CABG HTN HLD ?CHF - no echo in our system Hx of perforated appendicitis treated with abx   I reviewed ED provider notes, hospitalist notes, last 24 h vitals and pain scores, last 48 h intake and output, last 24 h labs and trends, and last 24 h imaging results.  This care required high  level of medical decision making.   Burnard JONELLE Louder, [facility] 04/04/2024, 9:33 AM Please see Amion for pager number during day hours 7:00am-4:30pm

## 2024-04-05 ENCOUNTER — Inpatient Hospital Stay (HOSPITAL_COMMUNITY)

## 2024-04-05 ENCOUNTER — Other Ambulatory Visit: Payer: Self-pay

## 2024-04-05 ENCOUNTER — Encounter (HOSPITAL_COMMUNITY): Payer: Self-pay | Admitting: Internal Medicine

## 2024-04-05 ENCOUNTER — Encounter (HOSPITAL_COMMUNITY): Admission: EM | Disposition: E | Payer: Self-pay | Source: Home / Self Care | Attending: Internal Medicine

## 2024-04-05 DIAGNOSIS — I5032 Chronic diastolic (congestive) heart failure: Secondary | ICD-10-CM

## 2024-04-05 DIAGNOSIS — Z9911 Dependence on respirator [ventilator] status: Secondary | ICD-10-CM

## 2024-04-05 DIAGNOSIS — K559 Vascular disorder of intestine, unspecified: Secondary | ICD-10-CM | POA: Diagnosis not present

## 2024-04-05 DIAGNOSIS — I110 Hypertensive heart disease with heart failure: Secondary | ICD-10-CM

## 2024-04-05 DIAGNOSIS — I2510 Atherosclerotic heart disease of native coronary artery without angina pectoris: Secondary | ICD-10-CM

## 2024-04-05 DIAGNOSIS — K529 Noninfective gastroenteritis and colitis, unspecified: Secondary | ICD-10-CM | POA: Diagnosis not present

## 2024-04-05 DIAGNOSIS — Z789 Other specified health status: Secondary | ICD-10-CM

## 2024-04-05 DIAGNOSIS — I4891 Unspecified atrial fibrillation: Secondary | ICD-10-CM | POA: Diagnosis not present

## 2024-04-05 DIAGNOSIS — D72829 Elevated white blood cell count, unspecified: Secondary | ICD-10-CM | POA: Diagnosis not present

## 2024-04-05 HISTORY — PX: APPENDECTOMY: SHX54

## 2024-04-05 HISTORY — PX: LAPAROSCOPY: SHX197

## 2024-04-05 HISTORY — PX: APPLICATION OF WOUND VAC: SHX5189

## 2024-04-05 HISTORY — PX: LAPAROTOMY: SHX154

## 2024-04-05 HISTORY — PX: BOWEL RESECTION: SHX1257

## 2024-04-05 LAB — POCT I-STAT, CHEM 8
BUN: 14 mg/dL (ref 8–23)
Calcium, Ion: 1.12 mmol/L — ABNORMAL LOW (ref 1.15–1.40)
Chloride: 100 mmol/L (ref 98–111)
Creatinine, Ser: 0.4 mg/dL — ABNORMAL LOW (ref 0.61–1.24)
Glucose, Bld: 101 mg/dL — ABNORMAL HIGH (ref 70–99)
HCT: 34 % — ABNORMAL LOW (ref 39.0–52.0)
Hemoglobin: 11.6 g/dL — ABNORMAL LOW (ref 13.0–17.0)
Potassium: 4.1 mmol/L (ref 3.5–5.1)
Sodium: 131 mmol/L — ABNORMAL LOW (ref 135–145)
TCO2: 19 mmol/L — ABNORMAL LOW (ref 22–32)

## 2024-04-05 LAB — BASIC METABOLIC PANEL WITH GFR
Anion gap: 15 (ref 5–15)
BUN: 12 mg/dL (ref 8–23)
CO2: 20 mmol/L — ABNORMAL LOW (ref 22–32)
Calcium: 8.6 mg/dL — ABNORMAL LOW (ref 8.9–10.3)
Chloride: 93 mmol/L — ABNORMAL LOW (ref 98–111)
Creatinine, Ser: 0.52 mg/dL — ABNORMAL LOW (ref 0.61–1.24)
GFR, Estimated: >60 mL/min (ref >60)
Glucose, Bld: 90 mg/dL (ref 70–99)
Potassium: 3.7 mmol/L (ref 3.5–5.1)
Sodium: 128 mmol/L — ABNORMAL LOW (ref 135–145)

## 2024-04-05 LAB — BLOOD GAS, ARTERIAL
Acid-base deficit: 7.6 mmol/L — ABNORMAL HIGH (ref 0.0–2.0)
Bicarbonate: 16.2 mmol/L — ABNORMAL LOW (ref 20.0–28.0)
Drawn by: 31394
FIO2: 100 %
MECHVT: 510 mL
O2 Saturation: 100 %
PEEP: 5 cmH2O
Patient temperature: 36.4
RATE: 18 {breaths}/min
pCO2 arterial: 27 mmHg — ABNORMAL LOW (ref 32–48)
pH, Arterial: 7.38 (ref 7.35–7.45)
pO2, Arterial: 435 mmHg — ABNORMAL HIGH (ref 83–108)

## 2024-04-05 LAB — TYPE AND SCREEN
ABO/RH(D): A NEG
Antibody Screen: NEGATIVE

## 2024-04-05 LAB — GASTROINTESTINAL PANEL BY PCR, STOOL (REPLACES STOOL CULTURE)

## 2024-04-05 LAB — CBC
HCT: 34.8 % — ABNORMAL LOW (ref 39.0–52.0)
HCT: 33.1 % — ABNORMAL LOW (ref 39.0–52.0)
Hemoglobin: 11.4 g/dL — ABNORMAL LOW (ref 13.0–17.0)
Hemoglobin: 10.1 g/dL — ABNORMAL LOW (ref 13.0–17.0)
MCH: 32.2 pg (ref 26.0–34.0)
MCH: 31.2 pg (ref 26.0–34.0)
MCHC: 32.8 g/dL (ref 30.0–36.0)
MCHC: 30.5 g/dL (ref 30.0–36.0)
MCV: 98.3 fL (ref 80.0–100.0)
MCV: 102.2 fL — ABNORMAL HIGH (ref 80.0–100.0)
Platelets: 265 K/uL (ref 150–400)
Platelets: 198 K/uL (ref 150–400)
RBC: 3.54 MIL/uL — ABNORMAL LOW (ref 4.22–5.81)
RBC: 3.24 MIL/uL — ABNORMAL LOW (ref 4.22–5.81)
RDW: 11.9 % (ref 11.5–15.5)
RDW: 11.9 % (ref 11.5–15.5)
WBC: 19.9 K/uL — ABNORMAL HIGH (ref 4.0–10.5)
WBC: 13.3 K/uL — ABNORMAL HIGH (ref 4.0–10.5)
nRBC: 0 % (ref 0.0–0.2)
nRBC: 0 % (ref 0.0–0.2)

## 2024-04-05 LAB — COMPREHENSIVE METABOLIC PANEL WITH GFR
ALT: 18 U/L (ref 0–44)
AST: 25 U/L (ref 15–41)
Albumin: 2.7 g/dL — ABNORMAL LOW (ref 3.5–5.0)
Alkaline Phosphatase: 45 U/L (ref 38–126)
Anion gap: 12 (ref 5–15)
BUN: 16 mg/dL (ref 8–23)
CO2: 19 mmol/L — ABNORMAL LOW (ref 22–32)
Calcium: 8.6 mg/dL — ABNORMAL LOW (ref 8.9–10.3)
Chloride: 103 mmol/L (ref 98–111)
Creatinine, Ser: 0.55 mg/dL — ABNORMAL LOW (ref 0.61–1.24)
GFR, Estimated: >60 mL/min (ref >60)
Glucose, Bld: 123 mg/dL — ABNORMAL HIGH (ref 70–99)
Potassium: 4.2 mmol/L (ref 3.5–5.1)
Sodium: 134 mmol/L — ABNORMAL LOW (ref 135–145)
Total Bilirubin: 2.2 mg/dL — ABNORMAL HIGH (ref 0.0–1.2)
Total Protein: 5.4 g/dL — ABNORMAL LOW (ref 6.5–8.1)

## 2024-04-05 LAB — MAGNESIUM: Magnesium: 1.6 mg/dL — ABNORMAL LOW (ref 1.7–2.4)

## 2024-04-05 LAB — LACTIC ACID, PLASMA
Lactic Acid, Venous: 1.2 mmol/L (ref 0.5–1.9)
Lactic Acid, Venous: 2.2 mmol/L (ref 0.5–1.9)
Lactic Acid, Venous: 2 mmol/L (ref 0.5–1.9)

## 2024-04-05 LAB — GLUCOSE, CAPILLARY
Glucose-Capillary: 75 mg/dL (ref 70–99)
Glucose-Capillary: 117 mg/dL — ABNORMAL HIGH (ref 70–99)
Glucose-Capillary: 149 mg/dL — ABNORMAL HIGH (ref 70–99)

## 2024-04-05 LAB — PHOSPHORUS: Phosphorus: 2.1 mg/dL — ABNORMAL LOW (ref 2.5–4.6)

## 2024-04-05 LAB — ABO/RH: ABO/RH(D): A NEG

## 2024-04-05 SURGERY — LAPAROSCOPY, DIAGNOSTIC
Anesthesia: General | Site: Abdomen

## 2024-04-05 MED ORDER — THIAMINE HCL 100 MG/ML IJ SOLN
100.0000 mg | INTRAMUSCULAR | Status: DC
  Administered 2024-04-06 – 2024-04-09 (×4): 100 mg via INTRAVENOUS
  Filled 2024-04-05 (×4): qty 2

## 2024-04-05 MED ORDER — SODIUM CHLORIDE 0.9% FLUSH
10.0000 mL | Freq: Two times a day (BID) | INTRAVENOUS | Status: DC
  Administered 2024-04-05: 20 mL
  Administered 2024-04-06 – 2024-04-07 (×3): 10 mL

## 2024-04-05 MED ORDER — PROPOFOL 1000 MG/100ML IV EMUL: 0.0000 ug/kg/min | INTRAVENOUS | Status: DC

## 2024-04-05 MED ORDER — PROPOFOL 10 MG/ML IV BOLUS
INTRAVENOUS | Status: DC | PRN
  Administered 2024-04-05: 100 mg via INTRAVENOUS
  Administered 2024-04-05: 50 ug/kg/min via INTRAVENOUS

## 2024-04-05 MED ORDER — ORAL CARE MOUTH RINSE: 15.0000 mL | Freq: Once | OROMUCOSAL | Status: AC

## 2024-04-05 MED ORDER — ROCURONIUM BROMIDE 10 MG/ML (PF) SYRINGE
PREFILLED_SYRINGE | INTRAVENOUS | Status: AC
  Filled 2024-04-05: qty 10

## 2024-04-05 MED ORDER — FENTANYL CITRATE (PF) 100 MCG/2ML IJ SOLN
INTRAMUSCULAR | Status: DC | PRN
  Administered 2024-04-05 (×3): 25 ug via INTRAVENOUS
  Administered 2024-04-05: 50 ug via INTRAVENOUS
  Administered 2024-04-05: 75 ug via INTRAVENOUS

## 2024-04-05 MED ORDER — ROCURONIUM BROMIDE 100 MG/10ML IV SOLN
INTRAVENOUS | Status: DC | PRN
  Administered 2024-04-05: 30 mg via INTRAVENOUS
  Administered 2024-04-05: 60 mg via INTRAVENOUS
  Administered 2024-04-05: 10 mg via INTRAVENOUS

## 2024-04-05 MED ORDER — HEPARIN (PORCINE) 25000 UT/250ML-% IV SOLN
1000.0000 [IU]/h | INTRAVENOUS | Status: DC
  Administered 2024-04-05: 1100 [IU]/h via INTRAVENOUS
  Administered 2024-04-06: 1000 [IU]/h via INTRAVENOUS
  Filled 2024-04-05: qty 250

## 2024-04-05 MED ORDER — SUGAMMADEX SODIUM 200 MG/2ML IV SOLN
INTRAVENOUS | Status: AC
  Filled 2024-04-05: qty 2

## 2024-04-05 MED ORDER — BUPIVACAINE-EPINEPHRINE 0.25% -1:200000 IJ SOLN
INTRAMUSCULAR | Status: DC | PRN
Start: 2024-04-05 — End: 2024-04-05
  Administered 2024-04-05: 10 mL

## 2024-04-05 MED ORDER — LIDOCAINE HCL (CARDIAC) PF 100 MG/5ML IV SOSY
PREFILLED_SYRINGE | INTRAVENOUS | Status: DC | PRN
  Administered 2024-04-05: 100 mg via INTRAVENOUS

## 2024-04-05 MED ORDER — ACETAMINOPHEN 650 MG RE SUPP: 650.0000 mg | Freq: Four times a day (QID) | RECTAL | Status: DC | PRN

## 2024-04-05 MED ORDER — METOPROLOL TARTRATE 5 MG/5ML IV SOLN
INTRAVENOUS | Status: DC | PRN
Start: 2024-04-05 — End: 2024-04-05
  Administered 2024-04-05 (×2): 5 mg via INTRAVENOUS

## 2024-04-05 MED ORDER — ALBUMIN HUMAN 5 % IV SOLN: INTRAVENOUS | Status: DC | PRN

## 2024-04-05 MED ORDER — FENTANYL CITRATE PF 50 MCG/ML IJ SOSY
25.0000 ug | PREFILLED_SYRINGE | Freq: Once | INTRAMUSCULAR | Status: AC
  Administered 2024-04-05: 50 ug via INTRAVENOUS
  Filled 2024-04-05: qty 1

## 2024-04-05 MED ORDER — THIAMINE HCL 100 MG/ML IJ SOLN
100.0000 mg | Freq: Once | INTRAMUSCULAR | Status: AC
  Administered 2024-04-05: 100 mg via INTRAVENOUS
  Filled 2024-04-05: qty 2

## 2024-04-05 MED ORDER — FENTANYL 2500MCG IN NS 250ML (10MCG/ML) PREMIX INFUSION
0.0000 ug/h | INTRAVENOUS | Status: DC
  Administered 2024-04-05 – 2024-04-07 (×2): 50 ug/h via INTRAVENOUS
  Filled 2024-04-05 (×2): qty 250

## 2024-04-05 MED ORDER — POTASSIUM PHOSPHATES 15 MMOLE/5ML IV SOLN
15.0000 mmol | Freq: Once | INTRAVENOUS | Status: AC
  Administered 2024-04-05: 15 mmol via INTRAVENOUS
  Filled 2024-04-05: qty 5

## 2024-04-05 MED ORDER — ORAL CARE MOUTH RINSE: 15.0000 mL | OROMUCOSAL | Status: DC | PRN

## 2024-04-05 MED ORDER — FENTANYL CITRATE PF 50 MCG/ML IJ SOSY: 25.0000 ug | PREFILLED_SYRINGE | INTRAMUSCULAR | Status: DC | PRN

## 2024-04-05 MED ORDER — DOCUSATE SODIUM 50 MG/5ML PO LIQD
100.0000 mg | Freq: Two times a day (BID) | ORAL | Status: DC
  Filled 2024-04-05: qty 10

## 2024-04-05 MED ORDER — PROPOFOL 10 MG/ML IV BOLUS
INTRAVENOUS | Status: AC
  Filled 2024-04-05: qty 20

## 2024-04-05 MED ORDER — SPY AGENT GREEN - (INDOCYANINE FOR INJECTION)
INTRAMUSCULAR | Status: DC | PRN
  Administered 2024-04-05 (×2): 2 mL via INTRAVENOUS

## 2024-04-05 MED ORDER — LIDOCAINE HCL (PF) 2 % IJ SOLN
INTRAMUSCULAR | Status: AC
  Filled 2024-04-05: qty 5

## 2024-04-05 MED ORDER — MAGNESIUM SULFATE 2 GM/50ML IV SOLN
2.0000 g | Freq: Once | INTRAVENOUS | Status: AC
  Administered 2024-04-05: 2 g via INTRAVENOUS
  Filled 2024-04-05: qty 50

## 2024-04-05 MED ORDER — CHLORHEXIDINE GLUCONATE CLOTH 2 % EX PADS
6.0000 | MEDICATED_PAD | Freq: Every day | CUTANEOUS | Status: DC
  Administered 2024-04-05 – 2024-04-09 (×4): 6 via TOPICAL

## 2024-04-05 MED ORDER — ALBUMIN HUMAN 5 % IV SOLN
INTRAVENOUS | Status: AC
  Filled 2024-04-05: qty 500

## 2024-04-05 MED ORDER — ONDANSETRON HCL 4 MG/2ML IJ SOLN
INTRAMUSCULAR | Status: AC
  Filled 2024-04-05: qty 2

## 2024-04-05 MED ORDER — TRAVASOL 10 % IV SOLN
INTRAVENOUS | Status: AC
  Filled 2024-04-05: qty 451.2

## 2024-04-05 MED ORDER — INSULIN ASPART 100 UNIT/ML IJ SOLN
0.0000 [IU] | INTRAMUSCULAR | Status: DC
  Administered 2024-04-05: 1 [IU] via SUBCUTANEOUS
  Administered 2024-04-06 (×2): 3 [IU] via SUBCUTANEOUS

## 2024-04-05 MED ORDER — PHENYLEPHRINE 80 MCG/ML (10ML) SYRINGE FOR IV PUSH (FOR BLOOD PRESSURE SUPPORT)
PREFILLED_SYRINGE | INTRAVENOUS | Status: DC | PRN
  Administered 2024-04-05: 80 ug via INTRAVENOUS
  Administered 2024-04-05: 120 ug via INTRAVENOUS
  Administered 2024-04-05 (×2): 80 ug via INTRAVENOUS

## 2024-04-05 MED ORDER — LACTATED RINGERS IV SOLN: INTRAVENOUS | Status: DC

## 2024-04-05 MED ORDER — ESMOLOL HCL 100 MG/10ML IV SOLN
INTRAVENOUS | Status: DC | PRN
  Administered 2024-04-05: 70 mg via INTRAVENOUS
  Administered 2024-04-05: 30 mg via INTRAVENOUS

## 2024-04-05 MED ORDER — SODIUM CHLORIDE 0.9 % IV SOLN: INTRAVENOUS | Status: DC

## 2024-04-05 MED ORDER — FENTANYL BOLUS VIA INFUSION
25.0000 ug | INTRAVENOUS | Status: DC | PRN
  Administered 2024-04-05: 50 ug via INTRAVENOUS
  Administered 2024-04-07: 25 ug via INTRAVENOUS
  Administered 2024-04-07: 50 ug via INTRAVENOUS
  Administered 2024-04-08: 75 ug via INTRAVENOUS

## 2024-04-05 MED ORDER — CHLORHEXIDINE GLUCONATE 0.12 % MT SOLN
15.0000 mL | Freq: Once | OROMUCOSAL | Status: AC
  Administered 2024-04-05: 15 mL via OROMUCOSAL

## 2024-04-05 MED ORDER — POTASSIUM CHLORIDE 10 MEQ/100ML IV SOLN: 10.0000 meq | INTRAVENOUS | Status: DC

## 2024-04-05 MED ORDER — POLYETHYLENE GLYCOL 3350 17 G PO PACK
17.0000 g | PACK | Freq: Every day | ORAL | Status: DC
  Filled 2024-04-05: qty 1

## 2024-04-05 MED ORDER — FENTANYL CITRATE (PF) 100 MCG/2ML IJ SOLN
INTRAMUSCULAR | Status: AC
Start: 2024-04-05 — End: 2024-04-05
  Filled 2024-04-05: qty 2

## 2024-04-05 MED ORDER — PROPOFOL 1000 MG/100ML IV EMUL
0.0000 ug/kg/min | INTRAVENOUS | Status: DC
  Administered 2024-04-05 (×3): 60 ug/kg/min via INTRAVENOUS
  Administered 2024-04-06 (×2): 50 ug/kg/min via INTRAVENOUS
  Administered 2024-04-06: 35 ug/kg/min via INTRAVENOUS
  Administered 2024-04-06: 60 ug/kg/min via INTRAVENOUS
  Administered 2024-04-06: 40 ug/kg/min via INTRAVENOUS
  Administered 2024-04-07 (×3): 50 ug/kg/min via INTRAVENOUS
  Administered 2024-04-07: 30 ug/kg/min via INTRAVENOUS
  Administered 2024-04-07: 50 ug/kg/min via INTRAVENOUS
  Administered 2024-04-08: 10 ug/kg/min via INTRAVENOUS
  Filled 2024-04-05 (×13): qty 100

## 2024-04-05 MED ORDER — SODIUM CHLORIDE 0.9% FLUSH: 10.0000 mL | INTRAVENOUS | Status: DC | PRN

## 2024-04-05 MED ORDER — ORAL CARE MOUTH RINSE
15.0000 mL | OROMUCOSAL | Status: DC | PRN
Start: 2024-04-05 — End: 2024-04-09

## 2024-04-05 MED ORDER — FAMOTIDINE 20 MG PO TABS: 20.0000 mg | ORAL_TABLET | Freq: Two times a day (BID) | ORAL | Status: DC

## 2024-04-05 MED ORDER — BUPIVACAINE-EPINEPHRINE (PF) 0.25% -1:200000 IJ SOLN
INTRAMUSCULAR | Status: AC
  Filled 2024-04-05: qty 30

## 2024-04-05 MED ORDER — ORAL CARE MOUTH RINSE
15.0000 mL | OROMUCOSAL | Status: DC
  Administered 2024-04-06 – 2024-04-09 (×35): 15 mL via OROMUCOSAL

## 2024-04-05 MED ORDER — ORAL CARE MOUTH RINSE
15.0000 mL | OROMUCOSAL | Status: DC
  Administered 2024-04-05 (×2): 15 mL via OROMUCOSAL

## 2024-04-05 MED ORDER — DEXAMETHASONE SODIUM PHOSPHATE 10 MG/ML IJ SOLN
INTRAMUSCULAR | Status: AC
  Filled 2024-04-05: qty 1

## 2024-04-05 MED ORDER — LACTATED RINGERS IV SOLN: INTRAVENOUS | Status: DC | PRN

## 2024-04-05 MED ORDER — ONDANSETRON HCL 4 MG/2ML IJ SOLN
INTRAMUSCULAR | Status: DC | PRN
  Administered 2024-04-05: 4 mg via INTRAVENOUS

## 2024-04-05 MED ORDER — FAMOTIDINE IN NACL 20-0.9 MG/50ML-% IV SOLN
20.0000 mg | Freq: Two times a day (BID) | INTRAVENOUS | Status: DC
  Administered 2024-04-05 – 2024-04-09 (×8): 20 mg via INTRAVENOUS
  Filled 2024-04-05 (×8): qty 50

## 2024-04-05 MED ORDER — 0.9 % SODIUM CHLORIDE (POUR BTL) OPTIME
TOPICAL | Status: DC | PRN
  Administered 2024-04-05: 2000 mL

## 2024-04-05 MED ORDER — PROPOFOL 500 MG/50ML IV EMUL
INTRAVENOUS | Status: AC
Start: 2024-04-05 — End: 2024-04-05
  Filled 2024-04-05: qty 50

## 2024-04-05 SURGICAL SUPPLY — 52 items
BAG COUNTER SPONGE SURGICOUNT (BAG) IMPLANT
BLADE EXTENDED COATED 6.5IN (ELECTRODE) IMPLANT
BLADE SURG SZ10 CARB STEEL (BLADE) IMPLANT
CANISTER WOUNDNEG PRESSURE 500 (CANNISTER) IMPLANT
CELLS DAT CNTRL 66122 CELL SVR (MISCELLANEOUS) IMPLANT
CHLORAPREP W/TINT 26 (MISCELLANEOUS) ×2 IMPLANT
CLIP APPLIE 5 13 M/L LIGAMAX5 (MISCELLANEOUS) IMPLANT
CLIP APPLIE ROT 10 11.4 M/L (STAPLE) IMPLANT
CLSR STERI-STRIP ANTIMIC 1/2X4 (GAUZE/BANDAGES/DRESSINGS) IMPLANT
COVER MAYO STAND STRL (DRAPES) IMPLANT
COVER SURGICAL LIGHT HANDLE (MISCELLANEOUS) ×2 IMPLANT
DERMABOND ADVANCED .7 DNX12 (GAUZE/BANDAGES/DRESSINGS) IMPLANT
DRAPE WARM FLUID 44X44 (DRAPES) IMPLANT
DRSG TEGADERM 2-3/8X2-3/4 SM (GAUZE/BANDAGES/DRESSINGS) IMPLANT
ELECT PENCIL ROCKER SW 15FT (MISCELLANEOUS) IMPLANT
ELECT REM PT RETURN 15FT ADLT (MISCELLANEOUS) ×2 IMPLANT
GAUZE SPONGE 2X2 8PLY STRL LF (GAUZE/BANDAGES/DRESSINGS) IMPLANT
GAUZE SPONGE 4X4 12PLY STRL (GAUZE/BANDAGES/DRESSINGS) ×2 IMPLANT
GLOVE BIO SURGEON STRL SZ7.5 (GLOVE) ×2 IMPLANT
GLOVE INDICATOR 8.0 STRL GRN (GLOVE) ×2 IMPLANT
GOWN STRL REUS W/ TWL XL LVL3 (GOWN DISPOSABLE) ×2 IMPLANT
HANDLE SUCTION POOLE (INSTRUMENTS) IMPLANT
IRRIGATION SUCT STRKRFLW 2 WTP (MISCELLANEOUS) IMPLANT
KIT BASIN OR (CUSTOM PROCEDURE TRAY) ×2 IMPLANT
KIT TURNOVER KIT A (KITS) ×2 IMPLANT
LIGASURE IMPACT 36 18CM CVD LR (INSTRUMENTS) IMPLANT
RELOAD STAPLE 75 3.8 BLU REG (ENDOMECHANICALS) IMPLANT
RETRACTOR WND ALEXIS 18 MED (MISCELLANEOUS) IMPLANT
SCISSORS LAP 5X35 DISP (ENDOMECHANICALS) ×2 IMPLANT
SHEARS HARMONIC 36 ACE (MISCELLANEOUS) IMPLANT
SLEEVE ADV FIXATION 5X100MM (TROCAR) IMPLANT
SPIKE FLUID TRANSFER (MISCELLANEOUS) ×2 IMPLANT
SPONGE ABD ABTHERA ADVANCE (MISCELLANEOUS) IMPLANT
STAPLER PROXIMATE 75MM BLUE (STAPLE) IMPLANT
STAPLER SKIN PROX 35W (STAPLE) IMPLANT
STRIP CLOSURE SKIN 1/2X4 (GAUZE/BANDAGES/DRESSINGS) IMPLANT
SUT SILK 2 0 SH CR/8 (SUTURE) IMPLANT
SUT SILK 2-0 18XBRD TIE 12 (SUTURE) IMPLANT
SUT SILK 3 0 SH CR/8 (SUTURE) IMPLANT
SUT SILK 3-0 18XBRD TIE 12 (SUTURE) IMPLANT
SYR BULB IRRIG 60ML STRL (SYRINGE) IMPLANT
SYSTEM LAPSCP GELPORT 120MM (MISCELLANEOUS) IMPLANT
SYSTEM WOUND ALEXIS 18CM MED (MISCELLANEOUS) IMPLANT
TOWEL OR 17X26 10 PK STRL BLUE (TOWEL DISPOSABLE) ×2 IMPLANT
TRAY FOLEY MTR SLVR 16FR STAT (SET/KITS/TRAYS/PACK) IMPLANT
TRAY LAPAROSCOPIC (CUSTOM PROCEDURE TRAY) ×2 IMPLANT
TROCAR 11X100 Z THREAD (TROCAR) IMPLANT
TROCAR ADV FIXATION 5X100MM (TROCAR) ×2 IMPLANT
TROCAR BALLN 12MMX100 BLUNT (TROCAR) IMPLANT
TROCAR XCEL NON-BLD 5MMX100MML (ENDOMECHANICALS) IMPLANT
TUBING CONNECTING 10 (TUBING) IMPLANT
YANKAUER SUCT BULB TIP NO VENT (SUCTIONS) IMPLANT

## 2024-04-05 NOTE — Progress Notes (Addendum)
 Progress Note     Subjective: Patient still having loose watery stools overnight, GI panel is pending. He denies significant nausea or vomiting. He reports cramping abdominal pain in central abdomen and RLQ which is intermittent but not improved from admission.   Objective: Vital signs in last 24 hours: Temp:  [98.1 F (36.7 C)-99 F (37.2 C)] 98.2 F (36.8 C) (08/20 0457) Pulse Rate:  [88-111] 104 (08/20 0635) Resp:  [15-20] 16 (08/20 0457) BP: (136-193)/(79-173) 147/88 (08/20 0635) SpO2:  [94 %-99 %] 97 % (08/20 0457) Weight:  [74.1 kg] 74.1 kg (08/19 1213) Last BM Date : 04/04/24  Intake/Output from previous day: 08/19 0701 - 08/20 0700 In: 3395.6 [I.V.:2595.6; IV Piggyback:800] Out: 550 [Urine:550] Intake/Output this shift: No intake/output data recorded.  PE: General: pleasant, WD, WN, elderly male who is laying in bed in NAD HEENT: head is normocephalic, atraumatic.  Sclera are noninjected.  Pupils equal and round.  Ears and nose without any masses or lesions.  Mouth is pink and dry Heart: irregularly irregular but rate in the 100s. Palpable radial and pedal pulses bilaterally Lungs: No wheezes, rhonchi, or rales noted.  Respiratory effort nonlabored Abd: soft, TTP around umbilicus and RLQ without peritonitis, mild distention, no involuntary/voluntary guarding  MS: trace edema in BL ankles L>R which patient reports is stable and chronic  Skin: warm and dry with no masses, lesions, or rashes Neuro: Cranial nerves 2-12 grossly intact, sensation is normal throughout Psych: A&Ox3 with an appropriate affect.   Lab Results:  Recent Labs    04/04/24 0850 04/05/24 0620  WBC 17.7* 19.9*  HGB 11.0* 11.4*  HCT 33.8* 34.8*  PLT 207 265   BMET Recent Labs    04/04/24 1237 04/05/24 0620  NA 130* 128*  K 3.1* 3.7  CL 94* 93*  CO2 22 20*  GLUCOSE 119* 90  BUN 11 12  CREATININE 0.41* 0.52*  CALCIUM  8.4* 8.6*   PT/INR No results for input(s): LABPROT, INR in  the last 72 hours. CMP     Component Value Date/Time   NA 128 (L) 04/05/2024 0620   K 3.7 04/05/2024 0620   CL 93 (L) 04/05/2024 0620   CO2 20 (L) 04/05/2024 0620   GLUCOSE 90 04/05/2024 0620   BUN 12 04/05/2024 0620   CREATININE 0.52 (L) 04/05/2024 0620   CALCIUM  8.6 (L) 04/05/2024 0620   PROT 7.0 04/03/2024 2011   ALBUMIN  3.2 (L) 04/03/2024 2011   AST 16 04/03/2024 2011   ALT 17 04/03/2024 2011   ALKPHOS 52 04/03/2024 2011   BILITOT 1.4 (H) 04/03/2024 2011   GFRNONAA >60 04/05/2024 0620   GFRAA >60 03/06/2020 0204   Lipase     Component Value Date/Time   LIPASE 29 04/03/2024 2011       Studies/Results: US  EKG SITE RITE Result Date: 04/05/2024 If Site Rite image not attached, placement could not be confirmed due to current cardiac rhythm.  CT ABDOMEN PELVIS W CONTRAST Result Date: 04/03/2024 CLINICAL DATA:  Abdomen pain diarrhea EXAM: CT ABDOMEN AND PELVIS WITH CONTRAST TECHNIQUE: Multidetector CT imaging of the abdomen and pelvis was performed using the standard protocol following bolus administration of intravenous contrast. RADIATION DOSE REDUCTION: This exam was performed according to the departmental dose-optimization program which includes automated exposure control, adjustment of the mA and/or kV according to patient size and/or use of iterative reconstruction technique. CONTRAST:  OMNIPAQUE  IOHEXOL  300 MG/ML  SOLN COMPARISON:  CT 07/15/2020 FINDINGS: Lower chest: Lung bases demonstrate no  acute airspace disease. Partially cystic lesion at right base with ground-glass nodularity at the periphery, series 5, image 6 and 7, measures about 16 x 10 mm on series 5, image 7, previously 13 x 11 mm. Hepatobiliary: Slightly lobulated liver morphology. Subcentimeter hypodensities in the left and right hepatic lobe too small to further characterize but stable and presumed benign. Slight focal hyperdensity inferior right hepatic lobe series 2, image 27, without masslike features  and possibly an area of altered perfusion. Overall heterogeneous liver parenchymal enhancement with vague areas of hyperenhancement. No biliary dilatation Pancreas: Unremarkable. No pancreatic ductal dilatation or surrounding inflammatory changes. Spleen: Normal in size without focal abnormality. Adrenals/Urinary Tract: Right adrenal gland demonstrates interval 18 mm nodule with density value of 104. Stable macroscopic fat containing left adrenal masses measuring 2.4 cm and 3.6 cm on series 2, image 18, consistent with myelolipomas. The kidneys show no hydronephrosis. Renal cysts and subcentimeter hypodensities too small to further characterize, no imaging follow-up is recommended. Urinary bladder is unremarkable Stomach/Bowel: The stomach is within normal limits. Mostly decompressed small bowel. Isolated short segment of abnormal appearing small bowel in the anterior pelvis, this segment of small bowel measures approximately 4.5 cm on coronal series 7, image 36, it exhibits wall thickening and hypoenhancement of the wall, series 2, image 55. There is associated mesenteric congestion and stranding. No perforation. No pneumatosis. Scattered fluid within the colon. Suspicion of mild wall thickening involving the hepatic flexure and proximal transverse colon with mild mucosal enhancement. Diverticular disease of left colon. Favor collapsed appearance of the rectosigmoid colon over colitis. Mostly normal appearing appendix, but with cystic dilatation of the tip, coronal series 7 image 46, this could be due to sequela from prior perforated appendix. Difficult to exclude some fat stranding in the vicinity, series 7, image 48. Vascular/Lymphatic: Advanced aortic atherosclerosis. 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 No aneurysm. No suspicious lymph nodes Reproductive: Prostate slightly enlarged. Other: Negative for pelvic effusion or free air. Small fat  containing umbilical hernia. Musculoskeletal: Scoliosis and advanced multilevel degenerative changes. No acute osseous abnormality IMPRESSION: 1. Isolated short segment of abnormal appearing small bowel in the anterior pelvis with wall thickening and hypoenhancement of the wall, associated mesenteric congestion and stranding. Findings are suspicious for ischemic enteritis. No perforation or pneumatosis. 2. Mild wall thickening and mucosal enhancement involving the hepatic flexure and proximal transverse colon, suspect for colitis of infectious, inflammatory or ischemic etiology. Diverticular disease of left colon with collapsed appearance versus mild colitis type wall thickening. 3. Mostly normal appearing appendix, but with cystic dilatation of the tip, this could be due to sequela from prior perforated appendix. Difficult to exclude some fat stranding in the vicinity. Correlate for any signs or symptoms of acute appendicitis. 4. Interval 18 mm right adrenal nodule, indeterminate. Recommend nonemergent adrenal protocol CT or MRI. Stable left adrenal myelolipomas. 5. Partially cystic lesion at the right lung base with ground-glass nodularity at the periphery, slightly increased in size compared to prior. Suggest CT follow-up in 6 months for continued monitoring. 6. Heterogeneous liver parenchymal enhancement with vague areas of hyperenhancement When the patient is clinically stable and able to follow directions and hold their breath (preferably as an outpatient) further evaluation with dedicated abdominal MRI should be considered. 7. Aortic atherosclerosis. 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 Critical Value/emergent results were called by telephone at the time of interpretation  on 04/03/2024 at 11:53 pm to provider [facility] , who verbally acknowledged these results. Aortic Atherosclerosis (ICD10-I70.0). Electronically Signed   By: Luke Bun  M.D.   On: 04/03/2024 23:53    Anti-infectives: Anti-infectives (From admission, onward)    Start     Dose/Rate Route Frequency Ordered Stop   04/04/24 2200  ceFEPIme  (MAXIPIME ) 2 g in sodium chloride  0.9 % 100 mL IVPB        2 g 200 mL/hr over 30 Minutes Intravenous Every 12 hours 04/04/24 1332     04/04/24 1300  metroNIDAZOLE  (FLAGYL ) IVPB 500 mg        500 mg 100 mL/hr over 60 Minutes Intravenous Every 12 hours 04/04/24 0825     04/04/24 0900  ceFEPIme  (MAXIPIME ) 2 g in sodium chloride  0.9 % 100 mL IVPB  Status:  Discontinued        2 g 200 mL/hr over 30 Minutes Intravenous Every 8 hours 04/04/24 0842 04/04/24 1332   04/04/24 0030  ceFEPIme  (MAXIPIME ) 2 g in sodium chloride  0.9 % 100 mL IVPB        2 g 200 mL/hr over 30 Minutes Intravenous  Once 04/04/24 0025 04/04/24 0107   04/04/24 0030  metroNIDAZOLE  (FLAGYL ) IVPB 500 mg        500 mg 100 mL/hr over 60 Minutes Intravenous  Once 04/04/24 0025 04/04/24 0137        Assessment/Plan  Abdominal pain with diarrhea Ischemic vs infectious enteritis - CT 8/18 with isolated short segment of thickened small bowel with hypoenhancement of the wall and associated mesenteric stranding and congestion in anterior pelvis, no pneumotosis; mild wall thickening of hepatic flexure and proximal transverse colon; mostly normal appearing appendix but some cystic dilation of tip could be due to sequela of prior perforated appendicitis; 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 - 8 day hx of diarrhea - GI panel pending  - WBC 19 from 17, afebrile but persistently tachycardic  - Lactic acid 1.4>0.9 yesterday, repeat pending this AM - given significant TTP on exam still will plan to proceed to the OR today for diagnostic laparoscopy, possible laparotomy and possible bowel resection  - EKG prior to OR, will go ahead and order PICC/TPN since I anticipate further needs for NPO - I have notified  bedside RN and attending provider for patient  - I have attempted to contact patient's daughter but was unable to reach her - I will attempt again later this AM but she and his wife are coming later this morning as well  - discussed with patient risks of surgery to include risks of general anesthesia (including stroke, MI, DVT, death), bleeding, infection and injury to other intraabdominal structures. Patient understands and agrees to proceed.     FEN: NPO, IVF per [facility], ordered PICC and TPN  VTE: Eliquis  on hold  ID: [email or web address] /[email or web address]    - per [facility] -  A. Fib on Eliquis  - LD 8/18 PM CAD s/p CABG HTN HLD CHF - last ECHO in 2021 with EF 65% Hx of perforated appendicitis treated with abx    LOS: 1 day   I reviewed hospitalist notes, last 24 h vitals and pain scores, last 48 h intake and output, last 24 h labs and trends, and last 24 h imaging results.  This care required high  level of medical decision making.    Burnard JONELLE Louder, [facility] Surgery 04/05/2024, 8:52 AM Please  see Amion for pager number during day hours 7:00am-4:30pm    Addendum: I was able to reach patient's daughter, Kairen Hallinan, and update her on plans for OR today. She and her mother are going to be here shortly this AM. Questions welcomed and answered.  Burnard JONELLE Louder, [facility] Surgery 04/05/2024, 10:50 AM Please see Amion for pager number during day hours 7:00am-4:30pm

## 2024-04-05 NOTE — Op Note (Signed)
 04/05/2024  2:15 PM  PATIENT:  Donald [name]  89 y.o. male  PRE-OPERATIVE DIAGNOSIS:  small bowel enteritis  POST-OPERATIVE DIAGNOSIS: ischemic small bowel   PROCEDURE:  Procedure(s): LAPAROSCOPY, DIAGNOSTIC LAPAROTOMY, EXPLORATORY APPENDECTOMY Resection of SMALL bowel x 2 without anastomosis Placement of abtera WOUND VAC  SURGEON:  Surgeon(s): Tanda Locus, MD  ASSISTANTS: Burnard Louder PA-C   ANESTHESIA:   general  EBL: minimal  DRAINS: Urinary Catheter (Foley) and orogastric tube   LOCAL MEDICATIONS USED:  MARCAINE      SPECIMEN:  Source of Specimen:  appendix and segment of small bowel x2  DISPOSITION OF SPECIMEN:  PATHOLOGY  COUNTS:  YES  INDICATION FOR PROCEDURE: 89 year old gentleman who was admitted August 18 with almost a 1 week history of diarrhea which then subsequently developed lower abdominal pain who came to the emergency room for evaluation.  He was found to have a small segment of small bowel that was thickened and inflamed consistent with small bowel enteritis.  He had a leukocytosis but was not ill-appearing.  His lactate was normal.  He was admitted and started on IV antibiotics with a low threshold to take the OR for failure to progress and/or if he clinically worsened.  Today while the patient's abdominal exam was stable it had not really changed that much and his leukocytosis was increasing so therefore I recommended to him and family numbers that he undergo diagnostic laparoscopy to rule out ischemic bowel.  We did discuss potential conversion to laparotomy with potential small bowel resection with the risk associated with that which were separately documented.  Patient had had a remote history of perforated appendicitis which was managed medically.  PROCEDURE: After obtaining informed consent the patient was taken to the OR for at [facility] and placed upon on the operating room table.  General endotracheal anesthesia was established.  Sequential  compression devices were placed.  A Foley catheter was placed.  His abdomen was prepped and draped in usual standard surgical fashion.  Surgical timeout was performed.  He was on therapeutic antibiotics.  Access to the abdomen was obtained via the Optiview technique in the left upper quadrant.  A small incision was made at Palmer's point.  Then using a 0 degree 5 mm laparoscope through a 5 mm trocar the laparoscope was advanced through all layers of the abdominal wall and carefully entered the abdominal cavity.  Pneumoperitoneum was smoothly established up to a patient pressure of 15 mmHg without any change in patient vital signs.  Laparoscope was advanced and the abdominal cavity was surveilled.  There is no evidence of injury to surrounding viscera.  He had a loop of small bowel tethered in the lower midline to the anterior abdominal wall.  2 additional trocars in the left lateral abdominal wall were placed under direct visualization.  I was able to gently peel down this loop of small bowel tethered to the anterior abdominal wall.  In doing so this revealed one of the loops having an area of significant hyperemia and significant ischemia.  I then identified the cecum and started running the small bowel back of the terminal ileum back proximally.  The appendix was very long and diving down toward the pelvis adhered to the small bowel mesentery.  The first 20 or 30 cm of the TI appeared normal and well-perfused.  We then started seeing patchy areas of partial ischemia in the distal small bowel.  At this time we had anesthesia administer ICG dye so we could look  at bowel perfusion using the Stryker camera system.  The last remaining 20 or 30 cm of the TI was very viable and lit up easily.  Then there were starting to be sort of patchy segments of viability based on the visualization of the green dye.  We then came across an area of ileum that was frankly ischemic and did not light up.  And then more proximal to that  again patchy areas of perfusion but not frank ischemia but just mild hyperemia.  We identified a segment of small bowel that was tethered to the base of the small bowel mesentery going into the pelvis and then proximal to that was an area of significant thickening that was probably the area of small bowel that was thickened on imaging.  At this time I decided to convert to an small laparotomy.  I continued run the bowel the proximal small bowel was viable.  It was mainly the ileum that had the 2 definite areas of frank ischemia with scattered areas of patchy viability.  A small midline incision was made starting slightly above the umbilicus and continued below the umbilicus.  Subcutaneous tissue was divided electrocautery.  The fascia was incised and the wound protector was placed.  Again identified the cecum.  Identified the appendix.  Since the patient had had a history of perforated appendicitis and had not had a colonoscopy since then I felt we should resect the appendix.  The appendiceal tip was thickened and somewhat adhered to the base of the small bowel mesentery.  We mobilized the appendix at its base with right angle.  The base of the cecum appeared normal with no palpable abnormality.  We started taking up the mesoappendix with LigaSure.  The tip was freed.  We then divided the appendix at its junction with the cecum with a GIA 75 stapler with a blue load and passed off the field.  We then running the small bowel starting at the terminal ileum and again as above the distalmost 20 to 30 cm was completely viable.  Then we started seeing some areas of patchy ischemia but not frank ischemia.  There was a section of small bowel upstream from that that was tethered to where the appendix tip had been tethered as well.  Ended up freeing this up with Metzenbaums and right angle.  This revealed frank area of nonviable small bowel.  We went ahead and started to staple off this section of the small bowel both above  and below the frankly demarcated areas of viability with a GIA 75 stapler with a blue load.  Mesentery was taken down with LigaSure.  This section of the small bowel was passed off.  Proximal to this there was again several patchy areas of ischemia but no frank full-thickness ischemia so I did not want to resect this today because I am hopeful that it will remain viable.  Moreover there is a long segment of small bowel between the section and the more proximal segment that was frankly ischemic and thickened.  Again in a similar fashion defects were made in the mesentery just above and below the frankly areas of ischemia and divided with a GIA 75 stapler with a blue load.  This was a segment of small bowel that was probably clearly inflamed on his admission CT.  Mesentery was taken down in sequential fashion with LigaSure device.  Proximal to this was also a 15 to 20 cm of also patchy ischemia but no full-thickness.  There was a Doppler signal in the base of the small bowel mesentery.  I did not want to commit him to losing pretty much his entire ileum so I felt we would leave him in discontinuity and bring it back in 48 hours for second look.  An open abdominal wound VAC ABThera was placed in typical fashion.  Prior to doing this we inspected the transected staple lines and mesentery and there is no evidence of bleeding.  Appendiceal staple line also appeared viable with no signs of bleeding.  The 3 trocars were removed and those skin incisions were closed with a 4 Monocryl followed by Steri-Strips 2 x 2 and Tegaderms.  The plastic drape was placed over the ABThera wound VAC and we had a good seal with no leak.  All needle, instrument, and sponge counts were correct x 2  Patient was left intubated and transferred to the ICU intubated in stable condition  I discussed the case with vascular surgery at the conclusion who recommended starting the patient on a heparin  drip and getting a formal CTA to evaluate his  mesentery.  All this was communicated with the ICU team  PLAN OF CARE: already inpt  PATIENT DISPOSITION:  ICU - intubated and hemodynamically stable.   Delay start of Pharmacological VTE agent (>24hrs) due to surgical blood loss or risk of bleeding:  hep gtt in 8 hrs without bolus  Camellia HERO. Tanda, MD, FACS General, Bariatric, & Minimally Invasive Surgery [facility] Surgery, [facility]

## 2024-04-05 NOTE — Progress Notes (Signed)
 PHARMACY - ANTICOAGULATION CONSULT NOTE  Pharmacy Consult for Heparin  Indication: atrial fibrillation (apixaban  on hold), ischemic bowel  Allergies  Allergen Reactions   Bee Venom Other (See Comments)    Other reaction(s): fainting, decreased BP   Chromium Other (See Comments)    rash   Gabapentin  Other (See Comments)    Can't take capsules, tablets are tolerated 7/18 pt states allergy is resolved now, can take capsules    Sulfa Antibiotics Rash    Patient Measurements: Height: 5' 6.5 (168.9 cm) Weight: 74.1 kg (163 lb 5.8 oz) IBW/kg (Calculated) : 64.95 HEPARIN  DW (KG): 74.1  Vital Signs: Temp: 98.7 F (37.1 C) (08/20 1056) Temp Source: Oral (08/20 1056) BP: (P) 193/103 (08/20 1101) Pulse Rate: 105 (08/20 1056)  Labs: Recent Labs    04/03/24 2011 04/04/24 0850 04/04/24 1237 04/05/24 0620 04/05/24 1157  HGB 12.2* 11.0*  --  11.4* 11.6*  HCT 35.5* 33.8*  --  34.8* 34.0*  PLT 212 207  --  265  --   CREATININE 0.48* 0.34* 0.41* 0.52* 0.40*    Estimated Creatinine Clearance: 57.6 mL/min (A) (by C-G formula based on SCr of 0.4 mg/dL (L)).   Medical History: Past Medical History:  Diagnosis Date   A-fib [facility])    Arrhythmia    CHF (congestive heart failure) (HCC)    Coronary artery disease    Hyperlipidemia    Hypertension     Medications:  Scheduled:   atorvastatin   40 mg Oral Daily   enoxaparin  (LOVENOX ) injection  40 mg Subcutaneous Q24H   gabapentin   400 mg Oral TID   insulin  aspart  0-9 Units Subcutaneous Q4H   losartan   100 mg Oral Daily   metoprolol  succinate  100 mg Oral Daily   [START ON 04/06/2024] thiamine  (VITAMIN B1) injection  100 mg Intravenous Q24H   Infusions:   sodium chloride  100 mL/hr at 04/05/24 9062   sodium chloride      ceFEPime  (MAXIPIME ) IV 2 g (04/05/24 0935)   magnesium  sulfate bolus IVPB     metronidazole  500 mg (04/05/24 0113)   potassium PHOSPHATE  IVPB (in mmol)     TPN ADULT (ION)      Assessment: 42 yoM presented  to ED on 8/19 with abdominal pain, found to have ischemic small bowel s/p exploratory/diagnositic laparotmy with appendectomy and resection x2 of small bowel on 8/20.  Pharmacy is consulted to dose Heparin  IV for ischemic bowel and history of Afib on apixaban  prior to admission.  Lase dose given on 8/18 at 17:30. Enoxaparin  started on admission and given on 8/18 at 10:15.  Today, 04/05/2024: SCr 0.4 CBC: Hgb low at 11.4, Plt WNL   Goal of Therapy:  Heparin  level 0.3-0.7 units/ml aPTT 66-102 seconds Monitor platelets by anticoagulation protocol: Yes   Plan:  No heparin  bolus per MD Start heparin  IV infusion at 1100 units/hr at 22:30 tonight per MD Heparin  level 8 hours after starting Daily heparin  level and CBC Follow up plans anticoagulation around second look procedure on 8/22    [facility] PharmD, BCPS [facility] main pharmacy [phone] 04/05/2024 3:02 PM

## 2024-04-05 NOTE — Plan of Care (Signed)
  Problem: Clinical Measurements: Goal: Ability to maintain clinical measurements within normal limits will improve Outcome: Progressing Goal: Diagnostic test results will improve Outcome: Progressing Goal: Respiratory complications will improve Outcome: Progressing Goal: Cardiovascular complication will be avoided Outcome: Progressing   Problem: Elimination: Goal: Will not experience complications related to bowel motility Outcome: Progressing

## 2024-04-05 NOTE — Plan of Care (Signed)

## 2024-04-05 NOTE — Consult Note (Signed)
 NAME:  Donald [name], MRN:  [id number], DOB:  [birth year], LOS: 1 ADMISSION DATE:  04/03/2024, CONSULTATION DATE:  04/05/2024 REFERRING MD:  Dr. Camellia Blush, CHIEF COMPLAINT:  ischemic small bowel   History of Present Illness:  History obtained via chart review patient is intubated and sedated  Donald [name] is a 89 year old male with PMH significant for Afib on Eliquis , CAD s/p CABGx3 (1998), HTN and HLD who presented on 04/03/2024 with 1 week of diarrhea and right lower quadrant abdominal pain. 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 Patient was admitted for further management and general surgery was consulted. Initially patient was managed conservatively on the floor but giving ongoing abdominal pain, worsening leukocytosis and persistent tachycardia the decision was made to proceed with diagnostic ex-lap on 8/20. Ex-lap showed small bowel ischemia and he underwent small bowel resection x 2 and appendectomy. He had other areas suspicious for ischemia so he was left open in discontinuity with plan to return for a second look on Friday 8/22. He was brought to ICU for ventilator management.   Pertinent  Medical History  Afib on Eliquis   CAD s/p CABGx3 (1998)  HTN HLD  Significant Hospital Events: Including procedures, antibiotic start and stop dates in addition to other pertinent events   04/03/2024: admitted for ischemic versus infectious enteritis 04/05/2024: OR for diagnostic ex-lap with ischemic small bowel s/p small bowel resection x 2, other concerning areas for ischemia, left open and in discontinuity. Transferred to ICU on ventilator.  Interim History / Subjective:  S/p OR per above  Objective    Blood pressure (!) 159/78, pulse 97, temperature (!) 97.5 F (36.4 C), temperature source Axillary, resp. rate 18, height 5' 6.5 (1.689  m), weight 75.2 kg, SpO2 100%.    Vent Mode: PRVC FiO2 (%):  [100 %] 100 % Set Rate:  [18 bmp] 18 bmp Vt Set:  [510 mL] 510 mL PEEP:  [5 cmH20] 5 cmH20 Plateau Pressure:  [20 cmH20] 20 cmH20   Intake/Output Summary (Last 24 hours) at 04/05/2024 1718 Last data filed at 04/05/2024 1500 Gross per 24 hour  Intake 4861.55 ml  Output 215 ml  Net 4646.55 ml   Filed Weights   04/04/24 1213 04/05/24 1500  Weight: 74.1 kg 75.2 kg    Examination: General: elderly male, intubated and sedated HENT: PERRL, normocephalic, atraumatic  Lungs: clear to auscultation bilaterally Cardiovascular: irregular rate and rhythm Abdomen: small midline abdominal wound vac, soft Extremities: warm, palpable pedal pulses Neuro: intubated and sedated GU: deferred   Resolved problem list   Assessment and Plan   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 Given additional concerning areas for ischemia now open abdomen and in discontinuity with plan to return to OR 8/22 for second look.   - General surgery following appreciate recommendations  - NPO, OGT to LIWS, all medications changed to IV - Continue cefepime  and flagyl   - Lactate 2.2, will trend- otherwise hemodynamically stable  Concern for SMA stenosis Seen on CT AP on admission. - Defer CTA AP until tomorrow given poor urine output  - Start heparin  drip tonight, no bolus  On mechanically assisted ventilation While open abdomen in discontinuity.  -  Continue lung protective ventilation - Wean FiO2 and PEEP as able to maintain SpO2>92% - Famotidine  for GI prophylaxis  Acute post-operative pain - Fentanyl  and propofol  to maintain RASS -4 to -5 while open abdomen   Chronic Afib  On Eliquis  at home, last dose 8/18 PM. Currently Afib  100's on monitor.  - Plan to start heparin  drip tonight per above - Order Echo to evaluate for embolic source of bowel ischemia  Leukocytosis due to ischemic bowel, improving - Continue to monitor daily CBC - Antibiotics per above  At risk for malnutrition - RD consulted, plan to place PICC and start TPN  Best Practice (right click and Reselect all SmartList Selections daily)   Diet/type: NPO DVT prophylaxis systemic heparin  to start tonight Pressure ulcer(s): N/A GI prophylaxis: H2B Lines: will need PICC line for TPN Foley:  Yes, and it is still needed Code Status:  full code Last date of multidisciplinary goals of care discussion [8/20]: updated granddaughter, Recardo, at bedside  Labs   CBC: Recent Labs  Lab 04/03/24 2011 04/04/24 0850 04/05/24 0620 04/05/24 1157  WBC 15.8* 17.7* 19.9*  --   HGB 12.2* 11.0* 11.4* 11.6*  HCT 35.5* 33.8* 34.8* 34.0*  MCV 94.9 96.3 98.3  --   PLT 212 207 265  --     Basic Metabolic Panel: Recent Labs  Lab 04/03/24 2011 04/04/24 0850 04/04/24 1237 04/05/24 0620 04/05/24 1012 04/05/24 1157  NA 124* 129* 130* 128*  --  131*  K 3.0* 3.6 3.1* 3.7  --  4.1  CL 87* 95* 94* 93*  --  100  CO2 24 23 22  20*  --   --   GLUCOSE 171* 114* 119* 90  --  101*  BUN 14 12 11 12   --  14  CREATININE 0.48* 0.34* 0.41* 0.52*  --  0.40*  CALCIUM  8.8* 8.1* 8.4* 8.6*  --   --   MG  --  1.6*  --   --  1.6*  --   PHOS  --   --   --   --  2.1*  --    GFR: Estimated Creatinine Clearance: 57.6 mL/min (A) (by C-G formula based on SCr of 0.4 mg/dL (L)). Recent Labs  Lab 04/03/24 2011 04/04/24 0141 04/04/24 0850 04/04/24 1237 04/05/24 0620 04/05/24 0812  WBC 15.8*  --  17.7*  --  19.9*  --   LATICACIDVEN  --  1.4  --  0.9  --  1.2    Liver Function Tests: Recent Labs  Lab 04/03/24 2011  AST 16  ALT 17  ALKPHOS 52  BILITOT 1.4*  PROT 7.0  ALBUMIN  3.2*   Recent Labs  Lab 04/03/24 2011  LIPASE 29   No results for input(s): AMMONIA  in the last 168 hours.  ABG    Component Value Date/Time   PHART 7.38 04/05/2024 1618   PCO2ART 27 (L) 04/05/2024 1618   PO2ART 435 (H) 04/05/2024 1618   HCO3 16.2 (L) 04/05/2024 1618   TCO2 19 (L) 04/05/2024 1157   ACIDBASEDEF 7.6 (H) 04/05/2024 1618   O2SAT 100 04/05/2024 1618     Coagulation Profile: No results for input(s): INR, PROTIME in the last 168 hours.  Cardiac Enzymes: No results for input(s): CKTOTAL, CKMB, CKMBINDEX, TROPONINI in the last 168 hours.  HbA1C: No results found for: HGBA1C  CBG: Recent Labs  Lab 04/05/24 0900 04/05/24 1529  GLUCAP 75 117*    Review of Systems:   Unable to obtain  patient intubated and sedated  Past Medical History:  He,  has a past medical history of A-fib (HCC), Arrhythmia, CHF (congestive heart failure) (HCC), Coronary artery disease, Hyperlipidemia, and Hypertension.   Surgical History:   Past Surgical History:  Procedure Laterality Date   CARDIAC SURGERY     CORONARY ARTERY BYPASS GRAFT  1998   x 3; [facility]     Social History:   reports that he has quit smoking. His smoking use included cigarettes. He has a 17.5 pack-year smoking history. He has never used smokeless tobacco. He reports current alcohol use. He reports that he does not use drugs.   Family History:  His family history includes Heart attack in his father and sister.   Allergies Allergies  Allergen Reactions   Bee Venom Other (See Comments)    Other reaction(s): fainting, decreased BP   Chromium Other (See Comments)    rash   Gabapentin  Other (See Comments)    Can't take capsules, tablets are tolerated 7/18 pt states allergy is resolved now, can take capsules    Sulfa Antibiotics Rash     Home Medications  Prior to Admission medications   Medication Sig Start Date End Date Taking? Authorizing Provider  apixaban  (ELIQUIS ) 5 MG TABS tablet Take 5 mg by mouth 2 (two) times daily.   Yes [provider]  Ascorbic Acid  (VITAMIN C) 500 MG CAPS Take 500 capsules by mouth daily.   Yes [provider]  atorvastatin  (LIPITOR) 40 MG tablet Take 40 mg by mouth daily.   Yes [provider]  cholecalciferol (VITAMIN D3) 25 MCG (1000 UNIT) tablet Take 1,000 Units by mouth daily.   Yes [provider]  furosemide  (LASIX ) 40 MG tablet Take 20-40 mg by mouth See admin instructions. Takes 1 tablet in the morning 1/2 tablet at 2 pm then another 1/2 tablet at 6 pm   Yes [provider]  gabapentin  (NEURONTIN ) 800 MG tablet Take 400 mg by mouth 3 (three) times daily.   Yes [provider]  losartan  (COZAAR ) 100 MG tablet Take 100 mg by mouth daily.   Yes [provider]  metoprolol  succinate (TOPROL -XL) 100 MG 24 hr tablet Take 100 mg by mouth daily. 03/20/24  Yes [provider]     Critical care time: 40 minutes    The patient is critically ill with multiple organ system failure and requires high complexity decision making for assessment and support, frequent evaluation and titration of therapies, advanced monitoring, review of radiographic studies and interpretation of complex data.   Critical Care Time devoted to patient care services, exclusive of separately billable procedures, described in this note is 40 minutes.  Rexene LOISE Blush, PA-C [facility] 04/05/24 6:02 PM  Please see [email or web address] for pager details.  From 7A-7P if no response, please call [phone] After hours, please call ELink [phone]

## 2024-04-05 NOTE — Progress Notes (Signed)
 Peripherally Inserted Central Catheter Placement  The IV Nurse has discussed with the patient and/or persons authorized to consent for the patient, the purpose of this procedure and the potential benefits and risks involved with this procedure.  The benefits include less needle sticks, lab draws from the catheter, and the patient may be discharged home with the catheter. Risks include, but not limited to, infection, bleeding, blood clot (thrombus formation), and puncture of an artery; nerve damage and irregular heartbeat and possibility to perform a PICC exchange if needed/ordered by physician.  Alternatives to this procedure were also discussed.  Bard Power PICC patient education guide, fact sheet on infection prevention and patient information card has been provided to patient /or left at bedside.    PICC Placement Documentation  PICC Triple Lumen 04/05/24 Right Brachial 39 cm 0 cm (Active)  Indication for Insertion or Continuance of Line Administration of hyperosmolar/irritating solutions (i.e. TPN, Vancomycin, etc.) 04/05/24 1824  Exposed Catheter (cm) 0 cm 04/05/24 1824  Site Assessment Clean, Dry, Intact 04/05/24 1824  Lumen #1 Status Flushed;Saline locked;Blood return noted 04/05/24 1824  Lumen #2 Status Flushed;Saline locked;Blood return noted 04/05/24 1824  Lumen #3 Status Flushed;Saline locked;Blood return noted 04/05/24 1824  Dressing Type Transparent;Securing device 04/05/24 1824  Dressing Status Antimicrobial disc/dressing in place;Clean, Dry, Intact 04/05/24 1824  Line Care Connections checked and tightened 04/05/24 1824  Line Adjustment (NICU/IV Team Only) No 04/05/24 1824  Dressing Intervention New dressing;Adhesive placed at insertion site (IV team only) 04/05/24 1824  Dressing Change Due 04/12/24 04/05/24 1824    Telephone consent by daughter Donald [name]   Donald [name] 04/05/2024, 6:26 PM

## 2024-04-05 NOTE — Progress Notes (Signed)
 Initial Nutrition Assessment  INTERVENTION:   Monitor magnesium , potassium, and phosphorus daily for at least 3 days, MD to replete as needed, as pt is at risk for refeeding syndrome. -Continue 100 mg Thiamine  for at least 5 days  -TPN management per Pharmacy -Daily weights while on TPN  NUTRITION DIAGNOSIS:   Increased nutrient needs related to acute illness as evidenced by estimated needs.  GOAL:   Patient will meet greater than or equal to 90% of their needs  MONITOR:   Diet advancement (TPN)  REASON FOR ASSESSMENT:   Consult New TPN/TNA  ASSESSMENT:   89 y.o. male with medical history significant for atrial fibrillation on Eliquis , CAD status post CABG, hypertension, hyperlipidemia, prior appendicitis with microperforation being admitted to the hospital with 1 week of diarrhea and right lower quadrant abdominal pain.  Patient prepping to go down to OR, unable to speak with pt at this time. Per chart review, pt has been having loose stools for 8 days now. Has not been eating well over the past week. Surgery recommending dx laparoscopy, may need bowel resection. Surgery anticipates pt will need to be NPO so TPN is being initiated.   No recent weight records available PTA.  Continue to monitor weight trends.  Current weight: 163 lbs.  Medications: Thiamine   Labs reviewed: CBGs: 75 Low Na GI panel pending   NUTRITION - FOCUSED PHYSICAL EXAM:  Unable to complete  Diet Order:   Diet Order             Diet NPO time specified Except for: Sips with Meds  Diet effective now                   EDUCATION NEEDS:   Not appropriate for education at this time  Skin:  Skin Assessment: Reviewed RN Assessment  Last BM:  8/20 -type 7  Height:   Ht Readings from Last 1 Encounters:  04/04/24 5' 6.5 (1.689 m)    Weight:   Wt Readings from Last 1 Encounters:  04/04/24 74.1 kg    BMI:  Body mass index is 25.97 kg/m.  Estimated Nutritional Needs:    Kcal:  1650-1850  Protein:  75-95g  Fluid:  1.8L/day   Morna Lee, MS, RD, LDN Inpatient Clinical Dietitian Contact via Secure chat

## 2024-04-05 NOTE — Anesthesia Preprocedure Evaluation (Signed)
 Anesthesia Evaluation  Patient identified by MRN, date of birth, ID band Patient awake    Reviewed: Allergy & Precautions, H&P , NPO status , Patient's Chart, lab work & pertinent test results  History of Anesthesia Complications Negative for: history of anesthetic complications  Airway Mallampati: II  TM Distance: >3 FB Neck ROM: Full    Dental no notable dental hx.    Pulmonary former smoker   Pulmonary exam normal breath sounds clear to auscultation       Cardiovascular hypertension, + CAD, + CABG and +CHF  Normal cardiovascular exam+ dysrhythmias Atrial Fibrillation  Rhythm:Regular Rate:Normal  CABG 1998 Echo from 2021 in care everywhere with normal biventricular function and no valvular abnormalities.    Neuro/Psych negative neurological ROS  negative psych ROS   GI/Hepatic Neg liver ROS,,,Concern for ischemic bowel   Endo/Other  negative endocrine ROS    Renal/GU negative Renal ROS  negative genitourinary   Musculoskeletal negative musculoskeletal ROS (+)    Abdominal   Peds negative pediatric ROS (+)  Hematology [facility] for afib   Anesthesia Other Findings Sepsis 2/2 enterocolitis / possible ischemic bowel   Reproductive/Obstetrics negative OB ROS                              Anesthesia Physical Anesthesia Plan  ASA: 3  Anesthesia Plan: General   Post-op Pain Management: Ofirmev  IV (intra-op)*   Induction: Intravenous  PONV Risk Score and Plan: 2 and Ondansetron , Dexamethasone  and Treatment may vary due to age or medical condition  Airway Management Planned: Oral ETT  Additional Equipment:   Intra-op Plan:   Post-operative Plan: Extubation in OR  Informed Consent: I have reviewed the patients History and Physical, chart, labs and discussed the procedure including the risks, benefits and alternatives for the proposed anesthesia with the patient or authorized  representative who has indicated his/her understanding and acceptance.     Dental advisory given  Plan Discussed with: CRNA  Anesthesia Plan Comments:         Anesthesia Quick Evaluation

## 2024-04-05 NOTE — Anesthesia Procedure Notes (Addendum)
 Procedure Name: Intubation Date/Time: 04/05/2024 12:47 PM  Performed by: Augusta Daved SAILOR, CRNAPre-anesthesia Checklist: Patient identified, Emergency Drugs available, Suction available and Patient being monitored Patient Re-evaluated:Patient Re-evaluated prior to induction Oxygen Delivery Method: Circle System Utilized Preoxygenation: Pre-oxygenation with 100% oxygen Induction Type: IV induction Ventilation: Mask ventilation without difficulty and Oral airway inserted - appropriate to patient size Laryngoscope Size: Cleotilde and 2 Grade View: Grade I Tube type: Oral Tube size: 7.5 mm Number of attempts: 1 Airway Equipment and Method: Stylet and Oral airway Placement Confirmation: ETT inserted through vocal cords under direct vision, positive ETCO2 and breath sounds checked- equal and bilateral Secured at: 22 (at the lip) cm Tube secured with: Tape Dental Injury: Teeth and Oropharynx as per pre-operative assessment

## 2024-04-05 NOTE — Transfer of Care (Signed)
 Immediate Anesthesia Transfer of Care Note  Patient: Donald [name]  Procedure(s) Performed: LAPAROSCOPY, DIAGNOSTIC (Abdomen) LAPAROTOMY, EXPLORATORY (Abdomen) APPENDECTOMY (Abdomen) EXCISION, SMALL INTESTINE (Abdomen) APPLICATION, WOUND VAC (Abdomen)  Patient Location: ICU  Anesthesia Type:General  Level of Consciousness: sedated and Patient remains intubated per anesthesia plan  Airway & Oxygen Therapy: Patient remains intubated per anesthesia plan  Post-op Assessment: Report given to RN and Post -op Vital signs reviewed and stable  Post vital signs: Reviewed and stable  Last Vitals:  Vitals Value Taken Time  BP 153/81 04/05/24 15:00  Temp    Pulse 112 04/05/24 15:01  Resp 17 04/05/24 14:58  SpO2 100 % 04/05/24 15:01  Vitals shown include unfiled device data.  Last Pain:  Vitals:   04/05/24 1200  TempSrc:   PainSc: 0-No pain      Patients Stated Pain Goal: 1 (04/04/24 1213)  Complications: No notable events documented.

## 2024-04-05 NOTE — Progress Notes (Signed)
  PROGRESS NOTE    Donald [name]  FMW:[id number] DOB: 04/18/1933 DOA: 04/03/2024 PCP: [facility]   Brief Narrative: 89 year old with past medical history significant for A-fib on Eliquis , CAD status post CABG, hypertension, hyperlipidemia, prior history of appendicitis with microperforation admitted with 1 week of diarrhea, right lower quadrant abdominal pain, 10 days ago he developed watery diarrhea 2 or 3 times a day right side abdominal pain.  CTT abdomen and pelvis: With finding consistent with ischemic enteritis,suspect for colitis of infectious, inflammatory or ischemic etiology.  Patient went to OR this morning. He will remain intubated post procedure. CCM will be taking over patient care. Discussed with Dr Shelah, they will call us  when patient is out of ICU. Patient not seen, no charge    Owen DELENA Lore, MD [facility]   If 7PM-7AM, please contact night-coverage [email or web address]  04/05/2024, 8:55 AM

## 2024-04-05 NOTE — Progress Notes (Signed)
 PHARMACY - TOTAL PARENTERAL NUTRITION CONSULT NOTE   Indication:  poor nutritional status  Patient Measurements: Height: 5' 6.5 (168.9 cm) Weight: 74.1 kg (163 lb 5.8 oz) IBW/kg (Calculated) : 64.95 TPN AdjBW (KG): 74.1 Body mass index is 25.97 kg/m. Usual Weight:   Assessment: Patient is an 89 y.o M with hx afib who presented to the ED on 04/03/24 with c/o diarrhea and abdominal pain. Abd/pelvis CT on 04/03/24 showed findings with concern for ischemic enteritis and colitis. With significant TTP noted on exam on 04/05/24, plan is to proceed with exp lap with possible laparotomy and bowel resection.  Pharmacy has been consulted on 04/05/24 to dose TPN for poor nutritional status.  Glucose / Insulin : no hx DM  -  sSSI q4h started on 8/20 per [facility] - CBGs (goal <150): 90-119 Electrolytes: Na low 128, K 3.7, Phos low 2.1, Mag low 1.6;  CL and CO2 low ; CorrCa wnl 9.24 Renal: scr 0.52 (crcl~57); BUN 12 Hepatic: labs on 8/18 - AST/ALT wnl, Tbili slightly elevated at 1.4 - Albumin  3.2 Intake / Output; MIVF: NS @ 100 ml/hr - I/O: +2845 ml - UOP: 550 mL GI Imaging: - 8/18 abd/pelvis CT: Findings are suspicious for ischemic enteritis. Mild wall thickening and mucosal enhancement involving the hepatic flexure and proximal transverse colon, suspect for colitis of infectious, inflammatory or ischemic etiology. GI Surgeries / Procedures:   Central access: pending PICC placement  TPN start date: 04/05/24 pending PICC placement   Nutritional Goals: Goal TPN rate is 75 mL/hr (provides 84 g of protein and 1803 kcals per day)  RD Assessment:    Per Morna Lee via [place] msg:  - 1650-1850 kcals - 75-95g protein -  1.8L/day fluid    Current Nutrition:  - NPO  Plan:   Now: - Magnesium  sulfate 2gm IV x1 - potassium phosphate  15 mmol IV x1 (contains 22 meq of KCL)   Today, at 1800:  - Start TPN at 61mL/hr at 1800 - Electrolytes in TPN:  Na 150 mEq/L K 50mEq/L Ca 83mEq/L Mg 29mEq/L Phos  15mmol/L Cl:Ac 1:1 - Add standard MVI and trace elements to TPN - Sensitive q4h SSI and adjust as needed  - Thiamine  100 mg daily x5 days (8/20 - 8/25) - Reduce MIVF to 60 mL/hr at 1800 - Monitor TPN labs on Mon/Thurs at a minimum  Sherlynn Tourville P 04/05/2024,8:51 AM

## 2024-04-06 ENCOUNTER — Inpatient Hospital Stay (HOSPITAL_COMMUNITY)

## 2024-04-06 ENCOUNTER — Inpatient Hospital Stay (HOSPITAL_COMMUNITY): Admitting: Anesthesiology

## 2024-04-06 ENCOUNTER — Other Ambulatory Visit: Payer: Self-pay

## 2024-04-06 ENCOUNTER — Encounter (HOSPITAL_COMMUNITY): Payer: Self-pay | Admitting: General Surgery

## 2024-04-06 ENCOUNTER — Encounter (HOSPITAL_COMMUNITY): Admission: EM | Disposition: E | Payer: Self-pay | Source: Home / Self Care | Attending: Internal Medicine

## 2024-04-06 DIAGNOSIS — K559 Vascular disorder of intestine, unspecified: Secondary | ICD-10-CM | POA: Diagnosis not present

## 2024-04-06 DIAGNOSIS — I4891 Unspecified atrial fibrillation: Secondary | ICD-10-CM

## 2024-04-06 DIAGNOSIS — Z9911 Dependence on respirator [ventilator] status: Secondary | ICD-10-CM | POA: Diagnosis not present

## 2024-04-06 DIAGNOSIS — K55069 Acute infarction of intestine, part and extent unspecified: Secondary | ICD-10-CM

## 2024-04-06 DIAGNOSIS — I2510 Atherosclerotic heart disease of native coronary artery without angina pectoris: Secondary | ICD-10-CM

## 2024-04-06 DIAGNOSIS — K529 Noninfective gastroenteritis and colitis, unspecified: Secondary | ICD-10-CM | POA: Diagnosis not present

## 2024-04-06 DIAGNOSIS — K55059 Acute (reversible) ischemia of intestine, part and extent unspecified: Secondary | ICD-10-CM

## 2024-04-06 DIAGNOSIS — Z87891 Personal history of nicotine dependence: Secondary | ICD-10-CM | POA: Diagnosis not present

## 2024-04-06 DIAGNOSIS — I10 Essential (primary) hypertension: Secondary | ICD-10-CM | POA: Diagnosis not present

## 2024-04-06 DIAGNOSIS — D72829 Elevated white blood cell count, unspecified: Secondary | ICD-10-CM | POA: Diagnosis not present

## 2024-04-06 HISTORY — PX: INSERTION OF ILIAC STENT: SHX6256

## 2024-04-06 HISTORY — PX: ULTRASOUND GUIDANCE FOR VASCULAR ACCESS: SHX6516

## 2024-04-06 HISTORY — PX: MECHANICAL THROMBECTOMY WITH AORTOGRAM AND INTERVENTION: SHX6836

## 2024-04-06 HISTORY — PX: LOWER EXTREMITY ANGIOGRAM: SHX5508

## 2024-04-06 HISTORY — PX: AORTOGRAM: SHX6300

## 2024-04-06 LAB — TYPE AND SCREEN
ABO/RH(D): A NEG
Antibody Screen: NEGATIVE

## 2024-04-06 LAB — POCT I-STAT 7, (LYTES, BLD GAS, ICA,H+H)
Acid-base deficit: 7 mmol/L — ABNORMAL HIGH (ref 0.0–2.0)
Acid-base deficit: 3 mmol/L — ABNORMAL HIGH (ref 0.0–2.0)
Bicarbonate: 18.8 mmol/L — ABNORMAL LOW (ref 20.0–28.0)
Bicarbonate: 22.6 mmol/L (ref 20.0–28.0)
Calcium, Ion: 1.17 mmol/L (ref 1.15–1.40)
Calcium, Ion: 1.23 mmol/L (ref 1.15–1.40)
HCT: 27 % — ABNORMAL LOW (ref 39.0–52.0)
HCT: 30 % — ABNORMAL LOW (ref 39.0–52.0)
Hemoglobin: 9.2 g/dL — ABNORMAL LOW (ref 13.0–17.0)
Hemoglobin: 10.2 g/dL — ABNORMAL LOW (ref 13.0–17.0)
O2 Saturation: 100 %
O2 Saturation: 100 %
Potassium: 3.5 mmol/L (ref 3.5–5.1)
Potassium: 4 mmol/L (ref 3.5–5.1)
Sodium: 136 mmol/L (ref 135–145)
Sodium: 134 mmol/L — ABNORMAL LOW (ref 135–145)
TCO2: 20 mmol/L — ABNORMAL LOW (ref 22–32)
TCO2: 24 mmol/L (ref 22–32)
pCO2 arterial: 36.8 mmHg (ref 32–48)
pCO2 arterial: 42.4 mmHg (ref 32–48)
pH, Arterial: 7.316 — ABNORMAL LOW (ref 7.35–7.45)
pH, Arterial: 7.335 — ABNORMAL LOW (ref 7.35–7.45)
pO2, Arterial: 264 mmHg — ABNORMAL HIGH (ref 83–108)
pO2, Arterial: 329 mmHg — ABNORMAL HIGH (ref 83–108)

## 2024-04-06 LAB — TRIGLYCERIDES: Triglycerides: 117 mg/dL (ref <150)

## 2024-04-06 LAB — CBC
HCT: 28.5 % — ABNORMAL LOW (ref 39.0–52.0)
Hemoglobin: 9.4 g/dL — ABNORMAL LOW (ref 13.0–17.0)
MCH: 32.4 pg (ref 26.0–34.0)
MCHC: 33 g/dL (ref 30.0–36.0)
MCV: 98.3 fL (ref 80.0–100.0)
Platelets: 196 K/uL (ref 150–400)
RBC: 2.9 MIL/uL — ABNORMAL LOW (ref 4.22–5.81)
RDW: 12 % (ref 11.5–15.5)
WBC: 17.9 K/uL — ABNORMAL HIGH (ref 4.0–10.5)
nRBC: 0 % (ref 0.0–0.2)

## 2024-04-06 LAB — MAGNESIUM: Magnesium: 1.9 mg/dL (ref 1.7–2.4)

## 2024-04-06 LAB — ECHOCARDIOGRAM LIMITED
AR max vel: 2.32 cm2
AV Area VTI: 2.24 cm2
AV Area mean vel: 2.32 cm2
AV Mean grad: 2 mmHg
AV Peak grad: 4.2 mmHg
Ao pk vel: 1.02 m/s
Area-P 1/2: 5.31 cm2
Height: 66.5 in
S' Lateral: 3.1 cm
Weight: 2719.59 [oz_av]

## 2024-04-06 LAB — BASIC METABOLIC PANEL WITH GFR
Anion gap: 8 (ref 5–15)
BUN: 15 mg/dL (ref 8–23)
CO2: 22 mmol/L (ref 22–32)
Calcium: 8.3 mg/dL — ABNORMAL LOW (ref 8.9–10.3)
Chloride: 101 mmol/L (ref 98–111)
Creatinine, Ser: 0.5 mg/dL — ABNORMAL LOW (ref 0.61–1.24)
GFR, Estimated: >60 mL/min
Glucose, Bld: 227 mg/dL — ABNORMAL HIGH (ref 70–99)
Potassium: 3.4 mmol/L — ABNORMAL LOW (ref 3.5–5.1)
Sodium: 131 mmol/L — ABNORMAL LOW (ref 135–145)

## 2024-04-06 LAB — APTT: aPTT: 101 s — ABNORMAL HIGH (ref 24–36)

## 2024-04-06 LAB — HEMOGLOBIN AND HEMATOCRIT, BLOOD
HCT: 31 % — ABNORMAL LOW (ref 39.0–52.0)
Hemoglobin: 10 g/dL — ABNORMAL LOW (ref 13.0–17.0)

## 2024-04-06 LAB — LACTIC ACID, PLASMA: Lactic Acid, Venous: 1.4 mmol/L (ref 0.5–1.9)

## 2024-04-06 LAB — GLUCOSE, CAPILLARY
Glucose-Capillary: 162 mg/dL — ABNORMAL HIGH (ref 70–99)
Glucose-Capillary: 183 mg/dL — ABNORMAL HIGH (ref 70–99)
Glucose-Capillary: 189 mg/dL — ABNORMAL HIGH (ref 70–99)
Glucose-Capillary: 193 mg/dL — ABNORMAL HIGH (ref 70–99)
Glucose-Capillary: 215 mg/dL — ABNORMAL HIGH (ref 70–99)
Glucose-Capillary: 215 mg/dL — ABNORMAL HIGH (ref 70–99)
Glucose-Capillary: 216 mg/dL — ABNORMAL HIGH (ref 70–99)
Glucose-Capillary: 226 mg/dL — ABNORMAL HIGH (ref 70–99)

## 2024-04-06 LAB — POCT ACTIVATED CLOTTING TIME
Activated Clotting Time: 135 s
Activated Clotting Time: 250 s

## 2024-04-06 LAB — PHOSPHORUS: Phosphorus: 2.1 mg/dL — ABNORMAL LOW (ref 2.5–4.6)

## 2024-04-06 LAB — HEPARIN LEVEL (UNFRACTIONATED): Heparin Unfractionated: 0.73 [IU]/mL — ABNORMAL HIGH (ref 0.30–0.70)

## 2024-04-06 SURGERY — AORTOGRAM
Anesthesia: General | Site: Groin

## 2024-04-06 MED ORDER — PHENYLEPHRINE HCL-NACL 20-0.9 MG/250ML-% IV SOLN
0.0000 ug/min | INTRAVENOUS | Status: DC
  Administered 2024-04-07: 15 ug/min via INTRAVENOUS
  Administered 2024-04-07: 45 ug/min via INTRAVENOUS
  Filled 2024-04-06 (×2): qty 250

## 2024-04-06 MED ORDER — IODIXANOL 320 MG/ML IV SOLN
INTRAVENOUS | Status: DC | PRN
  Administered 2024-04-06: 50 mL via INTRA_ARTERIAL

## 2024-04-06 MED ORDER — HEPARIN 6000 UNIT IRRIGATION SOLUTION
Status: DC | PRN
Start: 2024-04-06 — End: 2024-04-06
  Administered 2024-04-06: 1

## 2024-04-06 MED ORDER — ALBUMIN HUMAN 5 % IV SOLN
INTRAVENOUS | Status: DC | PRN
Start: 2024-04-06 — End: 2024-04-06

## 2024-04-06 MED ORDER — HEPARIN (PORCINE) 25000 UT/250ML-% IV SOLN
1100.0000 [IU]/h | INTRAVENOUS | Status: DC
  Administered 2024-04-07 – 2024-04-08 (×2): 1000 [IU]/h via INTRAVENOUS
  Filled 2024-04-06 (×2): qty 250

## 2024-04-06 MED ORDER — IOHEXOL 350 MG/ML SOLN
100.0000 mL | Freq: Once | INTRAVENOUS | Status: AC | PRN
  Administered 2024-04-06: 100 mL via INTRAVENOUS

## 2024-04-06 MED ORDER — MAGNESIUM SULFATE 2 GM/50ML IV SOLN
2.0000 g | Freq: Once | INTRAVENOUS | Status: AC
  Administered 2024-04-06: 2 g via INTRAVENOUS
  Filled 2024-04-06: qty 50

## 2024-04-06 MED ORDER — LACTATED RINGERS IV SOLN: INTRAVENOUS | Status: DC | PRN

## 2024-04-06 MED ORDER — PHENYLEPHRINE HCL (PRESSORS) 10 MG/ML IV SOLN
INTRAVENOUS | Status: DC | PRN
  Administered 2024-04-06 (×2): 80 ug via INTRAVENOUS

## 2024-04-06 MED ORDER — MUPIROCIN 2 % EX OINT
1.0000 | TOPICAL_OINTMENT | Freq: Two times a day (BID) | CUTANEOUS | Status: DC
Start: 2024-04-06 — End: 2024-04-09
  Administered 2024-04-06 – 2024-04-09 (×6): 1 via NASAL
  Filled 2024-04-06 (×2): qty 22

## 2024-04-06 MED ORDER — ASPIRIN 300 MG RE SUPP
300.0000 mg | Freq: Every day | RECTAL | Status: DC
  Administered 2024-04-07 – 2024-04-09 (×3): 300 mg via RECTAL
  Filled 2024-04-06 (×3): qty 1

## 2024-04-06 MED ORDER — ROCURONIUM BROMIDE 100 MG/10ML IV SOLN
INTRAVENOUS | Status: DC | PRN
Start: 2024-04-06 — End: 2024-04-06
  Administered 2024-04-06: 100 mg via INTRAVENOUS

## 2024-04-06 MED ORDER — PHENYLEPHRINE HCL-NACL 20-0.9 MG/250ML-% IV SOLN
INTRAVENOUS | Status: DC | PRN
  Administered 2024-04-06: 50 ug/min via INTRAVENOUS

## 2024-04-06 MED ORDER — HEPARIN SODIUM (PORCINE) 1000 UNIT/ML IJ SOLN
INTRAMUSCULAR | Status: DC | PRN
Start: 2024-04-06 — End: 2024-04-06
  Administered 2024-04-06: 8000 [IU] via INTRAVENOUS

## 2024-04-06 MED ORDER — 0.9 % SODIUM CHLORIDE (POUR BTL) OPTIME
TOPICAL | Status: DC | PRN
  Administered 2024-04-06: 1000 mL

## 2024-04-06 MED ORDER — INSULIN ASPART 100 UNIT/ML IJ SOLN
0.0000 [IU] | INTRAMUSCULAR | Status: DC
  Administered 2024-04-06: 5 [IU] via SUBCUTANEOUS
  Administered 2024-04-06 (×2): 3 [IU] via SUBCUTANEOUS
  Administered 2024-04-06: 5 [IU] via SUBCUTANEOUS
  Administered 2024-04-07 (×2): 3 [IU] via SUBCUTANEOUS

## 2024-04-06 MED ORDER — MAGNESIUM SULFATE IN D5W 1-5 GM/100ML-% IV SOLN
1.0000 g | Freq: Once | INTRAVENOUS | Status: DC
  Filled 2024-04-06: qty 100

## 2024-04-06 MED ORDER — POTASSIUM PHOSPHATES 15 MMOLE/5ML IV SOLN
30.0000 mmol | Freq: Once | INTRAVENOUS | Status: AC
  Administered 2024-04-06: 30 mmol via INTRAVENOUS
  Filled 2024-04-06: qty 10

## 2024-04-06 MED ORDER — TRAVASOL 10 % IV SOLN
INTRAVENOUS | Status: AC
  Filled 2024-04-06: qty 451.2

## 2024-04-06 MED ORDER — PROTAMINE SULFATE 10 MG/ML IV SOLN
INTRAVENOUS | Status: DC | PRN
  Administered 2024-04-06: 50 mg via INTRAVENOUS

## 2024-04-06 SURGICAL SUPPLY — 51 items
BAG COUNTER SPONGE SURGICOUNT (BAG) ×3 IMPLANT
BENZOIN TINCTURE PRP APPL 2/3 (GAUZE/BANDAGES/DRESSINGS) ×3 IMPLANT
CANISTER PENUMBRA ENGINE (MISCELLANEOUS) IMPLANT
CANISTER SUCTION 3000ML PPV (SUCTIONS) ×3 IMPLANT
CANISTER WOUND CARE 500ML ATS (WOUND CARE) IMPLANT
CATH BEACON 5 .035 65 KMP TIP (CATHETERS) IMPLANT
CATH LIGHTNING BOLT 7 130 (CATHETERS) IMPLANT
CATH OMNI FLUSH 5F 65CM (CATHETERS) ×3 IMPLANT
CATH QUICKCROSS .035X135CM (MICROCATHETER) IMPLANT
CHLORAPREP W/TINT 26 (MISCELLANEOUS) ×3 IMPLANT
CLOSURE PERCLOSE PROSTYLE (VASCULAR PRODUCTS) IMPLANT
COVER BACK TABLE 80X110 HD (DRAPES) ×6 IMPLANT
COVER PROBE W GEL 5X96 (DRAPES) ×3 IMPLANT
DEVICE CLOSURE MYNXGRIP 6/7F (Vascular Products) IMPLANT
DEVICE TORQUE KENDALL .025-038 (MISCELLANEOUS) IMPLANT
DRAPE C-ARM 42X72 X-RAY (DRAPES) IMPLANT
DRSG TEGADERM 4X4.75 (GAUZE/BANDAGES/DRESSINGS) IMPLANT
ELECTRODE REM PT RTRN 9FT ADLT (ELECTROSURGICAL) ×3 IMPLANT
FILTER CO2 0.2 MICRON (VASCULAR PRODUCTS) IMPLANT
FILTER CO2 INSUFFLATOR AX1008 (MISCELLANEOUS) IMPLANT
GAUZE SPONGE 4X4 12PLY STRL (GAUZE/BANDAGES/DRESSINGS) IMPLANT
GLIDEWIRE ADV .035X260CM (WIRE) IMPLANT
GLOVE BIO SURGEON STRL SZ8 (GLOVE) ×6 IMPLANT
GOWN STRL REUS W/ TWL LRG LVL3 (GOWN DISPOSABLE) ×6 IMPLANT
GOWN STRL REUS W/ TWL XL LVL3 (GOWN DISPOSABLE) ×3 IMPLANT
GUIDEWIRE ANGLED .035X150CM (WIRE) IMPLANT
KIT BASIN OR (CUSTOM PROCEDURE TRAY) ×3 IMPLANT
KIT ENCORE 26 ADVANTAGE (KITS) IMPLANT
KIT TURNOVER KIT B (KITS) ×3 IMPLANT
NDL PERC 18GX7CM (NEEDLE) ×3 IMPLANT
NEEDLE PERC 18GX7CM (NEEDLE) IMPLANT
NS IRRIG 1000ML POUR BTL (IV SOLUTION) ×6 IMPLANT
PACK ENDO MINOR (CUSTOM PROCEDURE TRAY) ×3 IMPLANT
PAD ARMBOARD POSITIONER FOAM (MISCELLANEOUS) ×6 IMPLANT
PENCIL SMOKE EVACUATOR (MISCELLANEOUS) IMPLANT
SET FLUSH CO2 (MISCELLANEOUS) IMPLANT
SET MICROPUNCTURE 5F STIFF (MISCELLANEOUS) ×3 IMPLANT
SHEATH APTUS 7.0FR 180D 55 (SHEATH) IMPLANT
SHEATH BRITE TIP 7FRX11 (SHEATH) IMPLANT
SHEATH PINNACLE 5F 10CM (SHEATH) ×3 IMPLANT
SHEATH PINNACLE 8F 10CM (SHEATH) ×3 IMPLANT
STENT VIABAHN 29XCATH135 (Permanent Stent) IMPLANT
STOPCOCK MORSE 400PSI 3WAY (MISCELLANEOUS) ×3 IMPLANT
STRIP CLOSURE SKIN 1/2X4 (GAUZE/BANDAGES/DRESSINGS) ×6 IMPLANT
SYR MEDRAD MARK V 150ML (SYRINGE) ×3 IMPLANT
TOWEL GREEN STERILE (TOWEL DISPOSABLE) ×6 IMPLANT
TUBING HIGH PRESSURE 120CM (CONNECTOR) ×3 IMPLANT
WATER STERILE IRR 1000ML POUR (IV SOLUTION) ×3 IMPLANT
WIRE AMPLATZ SS-J .035X180CM (WIRE) IMPLANT
WIRE AMPLATZ SS-J .035X260CM (WIRE) IMPLANT
WIRE BENTSON .035X145CM (WIRE) ×3 IMPLANT

## 2024-04-06 NOTE — Progress Notes (Signed)
 Patient seen and examined after arrival in [facility]. Remains intubated, sedated. On low-dose phenylephrine .  BP 128/65   Pulse 98   Temp 97.9 F (36.6 C) (Axillary)   Resp 18   Ht 5' 6.5 (1.689 m)   Wt 77.1 kg   SpO2 100%   BMI 27.02 kg/m  Critically ill appearing man lying in bed in NAD [place]/AT, eyes anicteric Reduced R anterior breath sounds, CTA on left S1S2, tachycardic, irreg rhythm Abd soft, NT. Wound vac lower midline abdomen Foley with blood-tinged urine  Con't sedation with propofol  & fentanyl  Con't phenylephrine ; new since OR May need amiodarone for rate control if pain control is inadequate Keeping intubated tonight for OR tomorrow.    Leita SHAUNNA Gaskins, DO 04/06/24 6:23 PM [facility]  For contact information, see Amion. If no response to pager, please call PCCM consult pager. After hours, 7PM- 7AM, please call Elink.

## 2024-04-06 NOTE — Transfer of Care (Signed)
 Immediate Anesthesia Transfer of Care Note  Patient: Donald [name]  Procedure(s) Performed: AORTOGRAM (Groin) INSERTION, STENT OF SUPERIOR MESENTERIC ARTERY USING X VIABAHN VBX (Groin) THROMBECTOMY, MECHANICAL WITH PENUMBRA ULTRASOUND GUIDANCE, FOR VASCULAR ACCESS ANGIOGRAM SUPERIOR MESENTERIC ARTERY (Groin)  Patient Location: ICU  Anesthesia Type:General  Level of Consciousness: sedated and Patient remains intubated per anesthesia plan  Airway & Oxygen Therapy: Patient remains intubated per anesthesia plan and Patient placed on Ventilator (see vital sign flow sheet for setting)  Post-op Assessment: Report given to RN and Post -op Vital signs reviewed and stable  Post vital signs: Reviewed and stable  Last Vitals:  Vitals Value Taken Time  BP 134/82 04/06/24 18:15  Temp    Pulse 136 04/06/24 18:15  Resp 18 04/06/24 18:14  SpO2 99 % 04/06/24 18:15  Vitals shown include unfiled device data.  Last Pain:  Vitals:   04/06/24 1200  TempSrc: Axillary  PainSc:       Patients Stated Pain Goal: 1 (04/04/24 1213)  Complications: No notable events documented.

## 2024-04-06 NOTE — Op Note (Signed)
 DATE OF SERVICE: 04/06/2024  PATIENT:  Donald [name]  89 y.o. male  PRE-OPERATIVE DIAGNOSIS:  acute mesenteric ischemia  POST-OPERATIVE DIAGNOSIS:  Same  PROCEDURE:   1) Ultrasound guided right common femoral artery access (CPT [phone]) 2) Abdominal aortogram (CPT 75625)\ 3) Superior mesenteric artery selective angiogram (CPT 36245) 4) Mechanical thrombectomy of superior mesenteric artery - penumbra CAT7 lightening bolt (CPT 37184) 5) Superior mesenteric artery angioplasty and stenting - 8x19mm VBX (CPT 37236)   SURGEON:  Debby SAILOR. Magda, MD  ASSISTANT: none  ANESTHESIA:   general  ESTIMATED BLOOD LOSS: minimal  LOCAL MEDICATIONS USED:  LIDOCAINE    COUNTS: confirmed correct.  PATIENT DISPOSITION:  PACU - hemodynamically stable.   Delay start of Pharmacological VTE agent (>24hrs) due to surgical blood loss or risk of bleeding: no  INDICATION FOR PROCEDURE: Donald [name] is a 89 y.o. 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 I discussed the dangerous nature of this diagnosis with his daughter by telephone. I recommended emergent revascularization. She was willing to proceed. We proceeded under emergency consent.   OPERATIVE FINDINGS:  Limited abdominal aortogram shows a patent celiac artery and thrombosed superior mesenteric artery.  After selecting the superior mesenteric artery, selective angiogram was performed to confirm accurate cannulation.  Good technical result achieved from thrombectomy and angioplasty and stenting of superior mesenteric artery.  Distal branches remain thrombosed, but no interventional options remain for this.   DESCRIPTION OF PROCEDURE: After identification of the patient in the pre-operative holding area, the patient was transferred to the operating room. The patient was positioned supine on the operating room table. Anesthesia was induced. The groins was prepped and draped in standard  fashion. A surgical pause was performed confirming correct patient, procedure, and operative location.  The right groin was anesthetized with subcutaneous injection of 1% lidocaine . Using ultrasound guidance, the right common femoral artery was accessed with micropuncture technique.  Fluoroscopy was used to confirm cannulation over the femoral head. The 36F micropuncture sheath was upsized to 53F.   A Benson wire was advanced into the perivisceral aorta. Over the wire an omni flush catheter was advanced. Aortogram was performed - see above for details.   Access was upsized to 7 [place].  A 7 French steerable sheath was advanced into the perivisceral aorta and shaped to engage to the superior mesenteric orifice, which had engaged easily.  Glidewire advantage navigated easily into the distal branch of the superior mesenteric artery.  Over the wire, mechanical thrombectomy is performed of the superior mesenteric artery with a penumbra CAT 7 lightning bolt device.  Improved flow was noted on follow-up angiogram.  A 8 x 29 mm VBX stent was then deployed across the orifice of the superior mesenteric artery.  Good technical result was achieved on follow-up imaging.  Both a Perclose and next device failed to close the arteriotomy.  Manual pressure was held for 32 minutes with good effect.    Upon completion of the case instrument and sharps counts were confirmed correct. The patient was transferred to the PACU in good condition. I was present for all portions of the procedure.  PLAN: Aspirin  81 mg daily.  Hold heparin  x 8 hours and resume thereafter.  Once general surgical procedures are completed, will stop anticoagulation with heparin  and start dual antiplatelet therapy.  Debby SAILOR. Magda, MD [facility] Phone Number: [phone] 04/06/2024 5:30 PM

## 2024-04-06 NOTE — Anesthesia Postprocedure Evaluation (Signed)
 Anesthesia Post Note  Patient: Donald [name]  Procedure(s) Performed: LAPAROSCOPY, DIAGNOSTIC (Abdomen) LAPAROTOMY, EXPLORATORY (Abdomen) APPENDECTOMY (Abdomen) EXCISION, SMALL INTESTINE (Abdomen) APPLICATION, WOUND VAC (Abdomen)     Patient location during evaluation: SICU Anesthesia Type: General Level of consciousness: sedated Pain management: pain level controlled Vital Signs Assessment: post-procedure vital signs reviewed and stable Respiratory status: patient remains intubated per anesthesia plan Cardiovascular status: stable Postop Assessment: no apparent nausea or vomiting Anesthetic complications: no   No notable events documented.  Last Vitals:  Vitals:   04/06/24 0600 04/06/24 0800  BP: (!) 100/56   Pulse: 91   Resp: 18   Temp:  37.1 C  SpO2: 100%     Last Pain:  Vitals:   04/06/24 0800  TempSrc: Axillary  PainSc:                  Thom JONELLE Peoples

## 2024-04-06 NOTE — Progress Notes (Signed)
*  PRELIMINARY RESULTS* Echocardiogram 2D Echocardiogram has been performed.  Donald [name] 04/06/2024, 8:37 AM

## 2024-04-06 NOTE — Progress Notes (Signed)
 Progress Note  1 Day Post-Op  Subjective: Sedated on vent.  No issues overnight noted.  Some blood-tinge in OGT, foley, VAC.  2 granddaughters at bedside  Objective: Vital signs in last 24 hours: Temp:  [97.5 F (36.4 C)-99.1 F (37.3 C)] 98.2 F (36.8 C) (08/21 0400) Pulse Rate:  [83-130] 91 (08/21 0600) Resp:  [0-24] 18 (08/21 0600) BP: (100-172)/(56-112) 100/56 (08/21 0600) SpO2:  [94 %-100 %] 100 % (08/21 0600) FiO2 (%):  [40 %-100 %] 40 % (08/21 0456) Weight:  [75.2 kg-77.1 kg] 77.1 kg (08/21 0433) Last BM Date : 04/05/24  Intake/Output from previous day: 08/20 0701 - 08/21 0700 In: 4529.6 [P.O.:20; I.V.:3230.6; IV Piggyback:1269] Out: 1010 [Urine:510; Emesis/NG output:200; Drains:200] Intake/Output this shift: No intake/output data recorded.  PE: General: sedated on vent Heart: irregular, 90s Lungs: on vent Abd: soft, Abthera VAC present.  Serosang output in cannister.  OGT with some old blood-tinged bilious output GU: foley with some blood-tinged urine   Lab Results:  Recent Labs    04/05/24 1710 04/06/24 0420  WBC 13.3* 17.9*  HGB 10.1* 9.4*  HCT 33.1* 28.5*  PLT 198 196   BMET Recent Labs    04/05/24 1710 04/06/24 0420  NA 134* 131*  K 4.2 3.4*  CL 103 101  CO2 19* 22  GLUCOSE 123* 227*  BUN 16 15  CREATININE 0.55* 0.50*  CALCIUM  8.6* 8.3*   PT/INR No results for input(s): LABPROT, INR in the last 72 hours. CMP     Component Value Date/Time   NA 131 (L) 04/06/2024 0420   K 3.4 (L) 04/06/2024 0420   CL 101 04/06/2024 0420   CO2 22 04/06/2024 0420   GLUCOSE 227 (H) 04/06/2024 0420   BUN 15 04/06/2024 0420   CREATININE 0.50 (L) 04/06/2024 0420   CALCIUM  8.3 (L) 04/06/2024 0420   PROT 5.4 (L) 04/05/2024 1710   ALBUMIN  2.7 (L) 04/05/2024 1710   AST 25 04/05/2024 1710   ALT 18 04/05/2024 1710   ALKPHOS 45 04/05/2024 1710   BILITOT 2.2 (H) 04/05/2024 1710   GFRNONAA >60 04/06/2024 0420   GFRAA >60 03/06/2020 0204   Lipase      Component Value Date/Time   LIPASE 29 04/03/2024 2011       Studies/Results: DG Chest Port 1 View Result Date: 04/05/2024 CLINICAL DATA:  PICC line placement EXAM: PORTABLE CHEST 1 VIEW COMPARISON:  None Available. FINDINGS: Endotracheal tube and NG tube remain in place, unchanged. Right PICC line in place with the tip at the cavoatrial junction. Prior median sternotomy. Heart mediastinal contours within normal limits. Consolidation in the right upper lobe, unchanged. Mild vascular congestion. No visible effusions or acute bony abnormality. IMPRESSION: Right PICC line tip at the cavoatrial junction. Stable right upper lobe consolidation. Mild vascular congestion. Electronically Signed   By: Franky Crease M.D.   On: 04/05/2024 19:14   DG CHEST PORT 1 VIEW Result Date: 04/05/2024 CLINICAL DATA:  On mechanically assisted ventilation EXAM: PORTABLE CHEST 1 VIEW COMPARISON:  None Available. FINDINGS: There is increased opacity of right mid-upper hemithorax likely representing an airspace consolidation/atelectasis. There is diffuse interstitial marking of central lungs. No significant pleural effusion. Cardiomediastinal silhouette is mildly enlarged. Status post median sternotomy. Enteric tube terminates in gastric fundus. Endotracheal tube terminates the 1.9 cm above carina. Degenerative changes of bilateral shoulder joints. IMPRESSION: Mid to upper right hemithorax opacity likely representing consolidation/atelectasis. Electronically Signed   By: Megan  Zare M.D.   On: 04/05/2024 17:50  US  EKG SITE RITE Result Date: 04/05/2024 If [facility] image not attached, placement could not be confirmed due to current cardiac rhythm.   Anti-infectives: Anti-infectives (From admission, onward)    Start     Dose/Rate Route Frequency Ordered Stop   04/04/24 2200  ceFEPIme  (MAXIPIME ) 2 g in sodium chloride  0.9 % 100 mL IVPB        2 g 200 mL/hr over 30 Minutes Intravenous Every 12 hours 04/04/24 1332      04/04/24 1300  metroNIDAZOLE  (FLAGYL ) IVPB 500 mg        500 mg 100 mL/hr over 60 Minutes Intravenous Every 12 hours 04/04/24 0825     04/04/24 0900  ceFEPIme  (MAXIPIME ) 2 g in sodium chloride  0.9 % 100 mL IVPB  Status:  Discontinued        2 g 200 mL/hr over 30 Minutes Intravenous Every 8 hours 04/04/24 0842 04/04/24 1332   04/04/24 0030  ceFEPIme  (MAXIPIME ) 2 g in sodium chloride  0.9 % 100 mL IVPB        2 g 200 mL/hr over 30 Minutes Intravenous  Once 04/04/24 0025 04/04/24 0107   04/04/24 0030  metroNIDAZOLE  (FLAGYL ) IVPB 500 mg        500 mg 100 mL/hr over 60 Minutes Intravenous  Once 04/04/24 0025 04/04/24 0137        Assessment/Plan POD 1, s/p ex lap with SBRx2 in discontinuity, open abdomen, Dr. Tanda 8/20 for ischemic bowel -tiger-stripe perfusion of ileus with ICG dye.  CTA to evaluate vasculature of this area to help determine plans for tomorrow in the OR. -labs overall stable.  WBC up from 13 to 17K, not unexpected post op -hgb slightly down from 11 to 9.  Monitor on heparin  -cont sedation due to open abdomen -plan to return to OR 8/22 -cont OGT, foley today -cont abx therapy -spoke to 2 granddaughters at the bedside for update -appreciate CCM assistance    FEN: NPO,OGT (famotidine  for some blood-tinged output), PICC/TNA VTE: Eliquis  on hold, heparin  gtt ID: cefepime /flagyl    - per TR[facility] -  A. Fib on Eliquis  - LD 8/18 PM CAD s/p CABG HTN HLD CHF - last ECHO in 2021 with EF 65% Hx of perforated appendicitis treated with abx    LOS: 2 days   I reviewed hospitalist notes, last 24 h vitals and pain scores, last 48 h intake and output, last 24 h labs and trends, and last 24 h imaging results.  This care required high  level of medical decision making.    Burnard FORBES Banter, [facility] 04/06/2024, 7:48 AM Please see Amion for pager number during day hours 7:00am-4:30pm    Burnard FORBES Banter, [facility] 04/06/2024, 7:48  AM Please see Amion for pager number during day hours 7:00am-4:30pm

## 2024-04-06 NOTE — Progress Notes (Signed)
 PHARMACY - TOTAL PARENTERAL NUTRITION CONSULT NOTE   Indication:  poor nutritional status  Patient Measurements: Height: 5' 6.5 (168.9 cm) Weight: 77.1 kg (169 lb 15.6 oz) IBW/kg (Calculated) : 64.95 TPN AdjBW (KG): 75.2 Body mass index is 27.02 kg/m. Usual Weight:   Assessment: Patient is an 89 y.o M with hx afib who presented to the ED on 04/03/24 with c/o diarrhea and abdominal pain. Abd/pelvis CT on 04/03/24 showed findings with concern for ischemic enteritis and colitis. With significant TTP noted on exam on 04/05/24, plan is to proceed with exp lap with possible laparotomy and bowel resection.  Pharmacy has been consulted on 04/05/24 to dose TPN for poor nutritional status.  Glucose / Insulin : no hx DM  -  sSSI q4h started on 8/20 per [facility] - CBGs (goal <150): 75-226 Electrolytes: Na low/improved to 131, K 3.4, Mag 1.9, Phos 2.1, CorrCa 9.34 WNL Renal: SCr < 1, BUN WNL  Hepatic: AST/ALT, Alk phos WNL.  Tbili elevated/increased to 2.2.   - Albumin  low at 2.7 -  8/21 Triglycerides 117 on propofol   Intake / Output; MIVF:  - Intake:  IVF d/c 8/20 - Output: drains 200 mL, NG 200 mL, Urine 510 mL (0.3 ml/kg/hr)  GI Imaging: - 8/18 CT: suspicious for ischemic enteritis. Mild wall thickening and mucosal enhancement involving the hepatic flexure and proximal transverse colon, suspect for colitis of infectious, inflammatory or ischemic etiology. GI Surgeries / Procedures:  - 8/20 exploratory/diagnositic laparotmy with appendectomy and resection x2 of ischemic small bowel  Central access: PICC 8/20  TPN start date: 04/05/24   Nutritional Goals: Goal TPN rate is 75 mL/hr (provides 84 g of protein and 1803 kcals per day)  RD Assessment: Estimated Needs Total Energy Estimated Needs: 1650-1850 Total Protein Estimated Needs: 75-95g Total Fluid Estimated Needs: 1.8L/day   Current Nutrition:  NPO TPN Propofol   40 mcg/kg/min, 17.8 ml/hr, 427 ml/day, 470 KCal/day  Plan:  Now: -  Magnesium  2gm IV x1  - KPhos 30 mmol IV x1 (contains 45 meq of K)  At 1800:  - Continue TPN at 33mL/hr at 1800  Do not advance d/t electrolytes, elevated CBGs Remove lipids d/t propofol  infusion (8/21) - Electrolytes in TPN:  Na 150 mEq/L Inc to K 60mEq/L Ca 5mEq/L Mg 62mEq/L Inc to Phos 20 mmol/L Cl:Ac 1:1 - Add standard MVI and trace elements to TPN - Increase to moderate q4h SSI and adjust as needed  - Thiamine  100 mg daily x5 days (8/20 - 8/25) - Monitor TPN labs on Mon/Thurs.  BMET, mag, phos x3 days.     Wanda Hasting PharmD, BCPS [facility] main pharmacy [phone] 04/06/2024 7:37 AM

## 2024-04-06 NOTE — Progress Notes (Signed)
 Transported patient to CT at 10:05. Returned to CCU at 10:45 . No events noted while on transport with patient. Once returned to room, power cord plugged into red outlet. Oxygen and air reconnected to wall. Patient tolerated well

## 2024-04-06 NOTE — Progress Notes (Signed)
 PHARMACY - ANTICOAGULATION CONSULT NOTE  Pharmacy Consult for Heparin  Indication: atrial fibrillation (apixaban  on hold), ischemic bowel  Allergies  Allergen Reactions   Bee Venom Other (See Comments)    Other reaction(s): fainting, decreased BP   Chromium Other (See Comments)    rash   Gabapentin  Other (See Comments)    Can't take capsules, tablets are tolerated 7/18 pt states allergy is resolved now, can take capsules    Sulfa Antibiotics Rash    Patient Measurements: Height: 5' 6.5 (168.9 cm) Weight: 77.1 kg (169 lb 15.6 oz) IBW/kg (Calculated) : 64.95 HEPARIN  DW (KG): 75.2  Vital Signs: Temp: 97.9 F (36.6 C) (08/21 1200) Temp Source: Axillary (08/21 1200) BP: 128/65 (08/21 1430) Pulse Rate: 98 (08/21 1430)  Labs: Recent Labs    04/05/24 0620 04/05/24 1157 04/05/24 1710 04/06/24 0420 04/06/24 1456 04/06/24 1610 04/06/24 1728  HGB 11.4* 11.6* 10.1* 9.4* 10.0* 9.2* 10.2*  HCT 34.8* 34.0* 33.1* 28.5* 31.0* 27.0* 30.0*  PLT 265  --  198 196  --   --   --   APTT  --   --   --  101*  --   --   --   HEPARINUNFRC  --   --   --  0.73*  --   --   --   CREATININE 0.52* 0.40* 0.55* 0.50*  --   --   --     Estimated Creatinine Clearance: 57.6 mL/min (A) (by C-G formula based on SCr of 0.5 mg/dL (L)).  Assessment: 17 yoM presented to ED on 8/19 with abdominal pain, found to have ischemic small bowel s/p exploratory/diagnostic laparotmy with appendectomy and resection x2 of small bowel on 8/20.  Pharmacy is consulted to dose Heparin  IV for ischemic bowel and history of Afib on apixaban  prior to admission.  Lase dose given on 8/18 at 17:30. Enoxaparin  prophylaxis started on admission and given on 8/19 at 10:15.  S/p OR for mechanical thrombectomy of superior mesenteric artery 8/21. MD ordered heparin  to restart 8/22 0200 at previous rate of 1000 units/hr. Plan to continue heparin  until general surgical procedures complete then change back to apixaban .  Goal of Therapy:   Heparin  level 0.3-0.7 units/ml aPTT 66-102 seconds Monitor platelets by anticoagulation protocol: Yes   Plan:  No heparin  bolus per MD At 0200, restart heparin  1000 units/hr aPTT, heparin  level 8 hours after restart  Vito Ralph, PharmD, BCPS Please see amion for complete clinical pharmacist phone list 04/06/2024 6:15 PM

## 2024-04-06 NOTE — Anesthesia Procedure Notes (Signed)
 Arterial Line Insertion Start/End8/21/2025 4:17 PM, 04/06/2024 4:17 PM Performed by: Arvell Edsel HERO, CRNA  Patient location: OR. Preanesthetic checklist: patient identified, IV checked, site marked, risks and benefits discussed, monitors and equipment checked, pre-op evaluation and timeout performed Right, radial was placed Hand hygiene performed  and maximum sterile barriers used  Allen's test indicative of satisfactory collateral circulation Attempts: 2 Procedure performed using ultrasound guided technique. Following insertion, dressing applied and Biopatch. Post procedure assessment: normal  Patient tolerated the procedure well with no immediate complications.

## 2024-04-06 NOTE — Progress Notes (Addendum)
 PHARMACY - ANTICOAGULATION CONSULT NOTE  Pharmacy Consult for Heparin  Indication: atrial fibrillation (apixaban  on hold), ischemic bowel  Allergies  Allergen Reactions   Bee Venom Other (See Comments)    Other reaction(s): fainting, decreased BP   Chromium Other (See Comments)    rash   Gabapentin  Other (See Comments)    Can't take capsules, tablets are tolerated 7/18 pt states allergy is resolved now, can take capsules    Sulfa Antibiotics Rash    Patient Measurements: Height: 5' 6.5 (168.9 cm) Weight: 77.1 kg (169 lb 15.6 oz) IBW/kg (Calculated) : 64.95 HEPARIN  DW (KG): 75.2  Vital Signs: Temp: 98.2 F (36.8 C) (08/21 0400) Temp Source: Oral (08/21 0400) BP: 100/56 (08/21 0600) Pulse Rate: 91 (08/21 0600)  Labs: Recent Labs    04/05/24 0620 04/05/24 1157 04/05/24 1710 04/06/24 0420  HGB 11.4* 11.6* 10.1* 9.4*  HCT 34.8* 34.0* 33.1* 28.5*  PLT 265  --  198 196  APTT  --   --   --  101*  HEPARINUNFRC  --   --   --  0.73*  CREATININE 0.52* 0.40* 0.55* 0.50*    Estimated Creatinine Clearance: 57.6 mL/min (A) (by C-G formula based on SCr of 0.5 mg/dL (L)).   Medical History: Past Medical History:  Diagnosis Date   A-fib [facility])    Arrhythmia    CHF (congestive heart failure) (HCC)    Coronary artery disease    Hyperlipidemia    Hypertension     Medications:  Scheduled:   Chlorhexidine  Gluconate Cloth  6 each Topical Daily   insulin  aspart  0-9 Units Subcutaneous Q4H   mouth rinse  15 mL Mouth Rinse Q2H   sodium chloride  flush  10-40 mL Intracatheter Q12H   thiamine  (VITAMIN B1) injection  100 mg Intravenous Q24H   Infusions:   ceFEPime  (MAXIPIME ) IV Stopped (04/05/24 2226)   famotidine  (PEPCID ) IV Stopped (04/05/24 2222)   fentaNYL  infusion INTRAVENOUS 50 mcg/hr (04/06/24 0147)   heparin  1,100 Units/hr (04/06/24 0147)   metronidazole  Stopped (04/06/24 0139)   propofol  (DIPRIVAN ) infusion 50 mcg/kg/min (04/06/24 0624)   TPN ADULT (ION) 40 mL/hr at  04/06/24 0147    Assessment: 89 yoM presented to ED on 8/19 with abdominal pain, found to have ischemic small bowel s/p exploratory/diagnositic laparotmy with appendectomy and resection x2 of small bowel on 8/20.  Pharmacy is consulted to dose Heparin  IV for ischemic bowel and history of Afib on apixaban  prior to admission.  Lase dose given on 8/18 at 17:30. Enoxaparin  prophylaxis started on admission and given on 8/19 at 10:15.  Today, 04/06/2024: aPTT 101, therapeutic at upper end of range on heparin  1100 units/hr Heparin  level 0.73, elevation may be due to recent DOAC use.  CBC: Hgb low/decreased (11.4, 10.1, 9.4), Plt WNL RN notes bloody fluid in drain and hematuria.  No active bleeding noted.  CCS and CCM aware.   Goal of Therapy:  Heparin  level 0.3-0.7 units/ml aPTT 66-102 seconds Monitor platelets by anticoagulation protocol: Yes   Plan:  No heparin  bolus per MD Decrease to heparin  IV infusion at 1000 units/hr  aPTT, heparin  level 8 hours after rate change Daily heparin  level and CBC Follow up plans anticoagulation around second look procedure on 8/22    Wanda Hasting PharmD, BCPS [facility] main pharmacy [phone] 04/06/2024 7:06 AM

## 2024-04-06 NOTE — Consult Note (Signed)
 [facility] [place]  ASSESSMENT / PLAN: 89 y.o. male with acute mesenteric ischemia, with thrombus versus embolus in the proximal SMA and thrombosis of distal SMA branches.  The patient is acutely ill, intubated, sedated, but without vasopressor support.  I discussed the patient's problem in detail with his daughter via telephone.  I explained the life-threatening nature of this diagnosis.  I explained that even with successful revascularization, he has a high risk of mortality.  Will plan to proceed with emergent aortogram, superior mesenteric artery thrombectomy, possible superior mesenteric artery stenting in the operating room today.  CHIEF COMPLAINT: Abdominal pain  HISTORY OF PRESENT ILLNESS: Donald [name] is a 89 y.o. male admitted to the critical care service for treatment of acute mesenteric ischemia.  The patient underwent laparotomy and bowel resection with Dr. Camellia Blush on 04/05/2024.  The patient was left in discontinuity and a ABThera wound VAC was applied.  CT angiogram was performed earlier this morning and showed thrombosis versus embolism of the proximal superior mesenteric artery.  I discussed the case in detail with Dr. Blush.  I discussed the case in detail with the patient's daughter.  I recommended emergent transfer with revascularization.  Past Medical History:  Diagnosis Date   A-fib [facility])    Arrhythmia    CHF (congestive heart failure) (HCC)    Coronary artery disease    Hyperlipidemia    Hypertension     Past Surgical History:  Procedure Laterality Date   APPENDECTOMY N/A 04/05/2024   Procedure: APPENDECTOMY;  Surgeon: Blush Camellia, MD;  Location: [facility];  Service: General;  Laterality: N/A;   APPLICATION OF WOUND VAC  04/05/2024   Procedure: APPLICATION, WOUND VAC;  Surgeon: Blush Camellia, MD;  Location: [facility];  Service: General;;  concetta   BOWEL RESECTION N/A 04/05/2024   Procedure: EXCISION, SMALL INTESTINE;  Surgeon: Blush Camellia, MD;   Location: [facility];  Service: General;  Laterality: N/A;  times 2,   CARDIAC SURGERY     CORONARY ARTERY BYPASS GRAFT  1998   x 3; [facility]   LAPAROSCOPY N/A 04/05/2024   Procedure: LAPAROSCOPY, DIAGNOSTIC;  Surgeon: Blush Camellia, MD;  Location: [facility];  Service: General;  Laterality: N/A;  POSSIBLE EXPLORATORY LAP POSSIBLE BOWEL RESECTION   LAPAROTOMY N/A 04/05/2024   Procedure: LAPAROTOMY, EXPLORATORY;  Surgeon: Blush Camellia, MD;  Location: [facility];  Service: General;  Laterality: N/A;    Family History  Problem Relation Age of Onset   Heart attack Father    Heart attack Sister     Social History   Socioeconomic History   Marital status: Married    Spouse name: Not on file   Number of children: 3   Years of education: Not on file   Highest education level: Not on file  Occupational History   Occupation: retired Manufacturing engineer - Tax inspector  Tobacco Use   Smoking status: Former    Current packs/day: 0.50    Average packs/day: 0.5 packs/day for 35.0 years (17.5 ttl pk-yrs)    Types: Cigarettes   Smokeless tobacco: Never  Vaping Use   Vaping status: Never Used  Substance and Sexual Activity   Alcohol use: Yes   Drug use: Never   Sexual activity: Not on file  Other Topics Concern   Not on file  Social History Narrative   Not on file   Social Drivers of Health   Financial Resource Strain: Low Risk  (03/13/2019)   Received from [facility]   Overall Financial Resource Strain (CARDIA)    Difficulty of Paying Living Expenses: Not very hard  Food Insecurity: No Food Insecurity (04/04/2024)   Hunger Vital Sign    Worried About Running Out of Food in the Last Year: Never true    Ran Out of Food in the Last Year: Never true  Transportation Needs: No Transportation Needs (04/04/2024)   PRAPARE - Administrator, Civil Service (Medical): No    Lack of Transportation (Non-Medical): No  Physical Activity: Not on file  Stress: Not  on file  Social Connections: Moderately Integrated (04/04/2024)   Social Connection and Isolation Panel    Frequency of Communication with Friends and Family: More than three times a week    Frequency of Social Gatherings with Friends and Family: Twice a week    Attends Religious Services: More than 4 times per year    Active Member of [employer] or Organizations: No    Attends Banker Meetings: Never    Marital Status: Married  Catering manager Violence: Not At Risk (04/04/2024)   Humiliation, Afraid, Rape, and Kick questionnaire    Fear of Current or Ex-Partner: No    Emotionally Abused: No    Physically Abused: No    Sexually Abused: No    Allergies  Allergen Reactions   Bee Venom Other (See Comments)    Other reaction(s): fainting, decreased BP   Chromium Other (See Comments)    rash   Gabapentin  Other (See Comments)    Can't take capsules, tablets are tolerated 7/18 pt states allergy is resolved now, can take capsules    Sulfa Antibiotics Rash    Current Facility-Administered Medications  Medication Dose Route Frequency Provider Last Rate Last Admin   [MAR Hold] acetaminophen  (TYLENOL ) suppository 650 mg  650 mg Rectal Q6H PRN Vicci Burnard SAUNDERS, PA-C       [MAR Hold] ceFEPIme  (MAXIPIME ) 2 g in sodium chloride  0.9 % 100 mL IVPB  2 g Intravenous Q12H Vicci Burnard SAUNDERS, PA-C   Stopped at 04/06/24 0932   [MAR Hold] Chlorhexidine  Gluconate Cloth 2 % PADS 6 each  6 each Topical Daily Vicci Burnard SAUNDERS, PA-C   6 each at 04/05/24 1511   [MAR Hold] famotidine  (PEPCID ) IVPB 20 mg premix  20 mg Intravenous Q12H Wilson, Tara N, PA-C   Stopped at 04/06/24 0930   [MAR Hold] fentaNYL  (SUBLIMAZE ) bolus via infusion 25-100 mcg  25-100 mcg Intravenous Q15 min PRN Wilson, Tara N, PA-C   50 mcg at 04/05/24 1608   [MAR Hold] fentaNYL  (SUBLIMAZE ) injection 25 mcg  25 mcg Intravenous Q15 min PRN Vicci Burnard SAUNDERS, PA-C       [MAR Hold] fentaNYL  (SUBLIMAZE ) injection 25-100 mcg  25-100 mcg  Intravenous Q30 min PRN Vicci Burnard SAUNDERS, PA-C       fentaNYL  in NS (27mcg/ml) infusion-PREMIX  0-400 mcg/hr Intravenous Continuous Wilson, Tara N, PA-C   Paused at 04/06/24 1158   heparin  ADULT infusion 100 units/mL (25000 units/250mL)  1,000 Units/hr Intravenous Continuous Shade, Christine E, RPH 10 mL/hr at 04/06/24 1402 1,000 Units/hr at 04/06/24 1402   [MAR Hold] insulin  aspart (novoLOG ) injection 0-15 Units  0-15 Units Subcutaneous Q4H Antonetta Moccasin B, NP   5 Units at 04/06/24 1153   [MAR Hold] metroNIDAZOLE  (FLAGYL ) IVPB 500 mg  500 mg Intravenous Q12H Vicci Burnard SAUNDERS, PA-C   Stopped at 04/06/24 1345   [MAR Hold] ondansetron  (ZOFRAN ) tablet 4 mg  4 mg Oral Q6H PRN Johnson, Kelly R, PA-C       Or   [MAR Hold] ondansetron  (ZOFRAN ) injection 4 mg  4 mg Intravenous Q6H PRN Johnson, Kelly R, PA-C       [MAR Hold] Oral care mouth rinse  15 mL Mouth Rinse Q2H Byrum, Robert S, MD   15 mL at 04/06/24 1403   [MAR Hold] Oral care mouth rinse  15 mL Mouth Rinse PRN Byrum, Robert S, MD       propofol  (DIPRIVAN ) 1000 MG/100ML infusion  0-80 mcg/kg/min Intravenous Continuous Wilson, Tara N, PA-C 15.56 mL/hr at 04/06/24 1456 35 mcg/kg/min at 04/06/24 1456   [MAR Hold] sodium chloride  flush (NS) 0.9 % injection 10-40 mL  10-40 mL Intracatheter Q12H Shelah Lamar RAMAN, MD   10 mL at 04/06/24 0902   [MAR Hold] sodium chloride  flush (NS) 0.9 % injection 10-40 mL  10-40 mL Intracatheter PRN Shelah Lamar RAMAN, MD       [MAR Hold] thiamine  (VITAMIN B1) injection 100 mg  100 mg Intravenous Q24H Johnson, Kelly R, PA-C   100 mg at 04/06/24 0855   TPN ADULT (ION)   Intravenous Continuous TPN Johnson, Kelly R, PA-C 40 mL/hr at 04/06/24 1400 IV Pump Association at 04/06/24 1400   TPN ADULT (ION)   Intravenous Continuous TPN Shade, Christine FORBES, RPH        PHYSICAL EXAM Vitals:   04/06/24 1400 04/06/24 1424 04/06/24 1430 04/06/24 1500  BP: (!) 126/55  128/65   Pulse: (!) 109 (!) 102 98   Resp: 18 18 18 18    Temp:      TempSrc:      SpO2: 100% 100% 100%   Weight:      Height:       Elderly man.  Intubated and sedated. Irregularly irregular rhythm Unlabored on vent. Open abdomen.  VAC in place.  PERTINENT LABORATORY AND RADIOLOGIC DATA  Most recent CBC    Latest Ref Rng & Units 04/06/2024    2:56 PM 04/06/2024    4:20 AM 04/05/2024    5:10 PM  CBC  WBC 4.0 - 10.5 K/uL  17.9  13.3   Hemoglobin 13.0 - 17.0 g/dL 89.9  9.4  89.8   Hematocrit 39.0 - 52.0 % 31.0  28.5  33.1   Platelets 150 - 400 K/uL  196  198      Most recent CMP    Latest Ref Rng & Units 04/06/2024    4:20 AM 04/05/2024    5:10 PM 04/05/2024   11:57 AM  CMP  Glucose 70 - 99 mg/dL 772  876  898   BUN 8 - 23 mg/dL 15  16  14    Creatinine 0.61 - 1.24 mg/dL 9.49  9.44  9.59   Sodium 135 - 145 mmol/L 131  134  131   Potassium 3.5 - 5.1 mmol/L 3.4  4.2  4.1   Chloride 98 - 111 mmol/L 101  103  100   CO2 22 - 32 mmol/L 22  19    Calcium  8.9 - 10.3 mg/dL 8.3  8.6    Total Protein 6.5 - 8.1 g/dL  5.4    Total Bilirubin 0.0 - 1.2 mg/dL  2.2    Alkaline Phos 38 - 126 U/L  45    AST 15 - 41 U/L  25    ALT 0 - 44 U/L  18     CT angiogram personally reviewed.  Near occlusive thrombus /  embolus in SMA. Distal SMA is thrombosed.   Debby SAILOR. Magda, MD FACS [facility] Phone Number: [phone] 04/06/2024 3:40 PM   Total time spent on preparing this encounter including chart review, data review, collecting history, examining the patient, and coordinating care: 60 minutes  Portions of this report may have been transcribed using voice recognition software.  Every effort has been made to ensure accuracy; however, inadvertent computerized transcription errors may still be present.

## 2024-04-06 NOTE — Anesthesia Preprocedure Evaluation (Signed)
 Anesthesia Evaluation  Patient identified by MRN, date of birth, ID band Patient unresponsive  General Assessment Comment:Pt is intubated and sedated  Reviewed: Allergy & Precautions, NPO status , Patient's Chart, lab work & pertinent test results, Unable to perform ROS - Chart review onlyPreop documentation limited or incomplete due to emergent nature of procedure.  History of Anesthesia Complications Negative for: history of anesthetic complications  Airway Mallampati: Intubated  TM Distance: >3 FB     Dental   Pulmonary former smoker Pt remains Intubated: mesenteric ischemia   Pulmonary exam normal        Cardiovascular hypertension, Pt. on medications and Pt. on home beta blockers + CAD, + CABG and + Peripheral Vascular Disease  + dysrhythmias Atrial Fibrillation  Rhythm:Regular Rate:Tachycardia  '21 ECHO:   Left Ventricle Normal left ventricular systolic function. No wall motion abnormalities in all visualized segments.   Function: Unable to assess diastolic function in this patient.     Neuro/Psych    GI/Hepatic Ischemic enteritis/ischemia Now for SMA thrombectomy/stenting   Endo/Other  negative endocrine ROS    Renal/GU negative Renal ROS     Musculoskeletal   Abdominal   Peds  Hematology  (+) Blood dyscrasia (Hb 9.4, plt 196k), anemia eliquis    Anesthesia Other Findings   Reproductive/Obstetrics                              Anesthesia Physical Anesthesia Plan  ASA: 4 and emergent  Anesthesia Plan: General   Post-op Pain Management:    Induction: Intravenous and Inhalational  PONV Risk Score and Plan: 2 and Treatment may vary due to age or medical condition  Airway Management Planned: Oral ETT  Additional Equipment: Arterial line  Intra-op Plan:   Post-operative Plan: Post-operative intubation/ventilation  Informed Consent:      Only emergency history  available  Plan Discussed with: CRNA and Surgeon  Anesthesia Plan Comments: (For emergent thrombectomy)         Anesthesia Quick Evaluation

## 2024-04-06 NOTE — TOC Initial Note (Signed)
 Transition of Care [facility]) - Initial/Assessment Note    Patient Details  Name: Donald [name] MRN: [id number] Date of Birth: 1933-08-03  Transition of Care [facility] CM/SW Contact:    Jon ONEIDA Anon, RN Phone Number: 04/06/2024, 3:40 PM  Clinical Narrative:                 Pt is from an [facility] [place] [place]. Pt is currently on the ventilator. Continued medical work-up, not medically stable for discharge. PT/OT have been consulted. IP Care Management will follow for any new recommendations or DC needs.    Expected Discharge Plan:  (TBD) Barriers to Discharge: Continued Medical Work up   Patient Goals and CMS Choice Patient states their goals for this hospitalization and ongoing recovery are:: Pt currently on the ventilator, UTA CMS Medicare.gov Compare Post Acute Care list provided to:: Other (Comment Required) (NA) Choice offered to / list presented to : NA [place] ownership interest in [facility].provided to:: Parent NA    Expected Discharge Plan and Services In-house Referral: NA Discharge Planning Services: CM Consult Post Acute Care Choice: Home Health Living arrangements for the past 2 months: Independent Living Facility                 DME Arranged: N/A DME Agency: NA       HH Arranged: NA HH Agency: NA        Prior Living Arrangements/Services Living arrangements for the past 2 months: Independent Living Facility Lives with:: Spouse Patient language and need for interpreter reviewed:: Yes Do you feel safe going back to the place where you live?:  (UTA, pt currently on ventilator support)      Need for Family Participation in Patient Care: Yes (Comment) Care giver support system in place?: Yes (comment) Current home services: Home PT Criminal Activity/Legal Involvement Pertinent to Current Situation/Hospitalization: No - Comment as needed  Activities of Daily Living   ADL Screening (condition at time of  admission) Independently performs ADLs?: Yes (appropriate for developmental age) Is the patient deaf or have difficulty hearing?: No Does the patient have difficulty seeing, even when wearing glasses/contacts?: No Does the patient have difficulty concentrating, remembering, or making decisions?: No  Permission Sought/Granted Permission sought to share information with : Family Supports    Share Information with NAME: [name], Donald S. (Spouse)  [phone]           Emotional Assessment Appearance:: Other (Comment Required (UTA) Attitude/Demeanor/Rapport: Unable to Assess Affect (typically observed): Unable to Assess Orientation: :  (UTA, pt currently on the ventilator) Alcohol / Substance Use: Not Applicable Psych Involvement: No (comment)  Admission diagnosis:  Colitis [K52.9] Ischemic enteritis [facility]) [K55.9] Patient Active Problem List   Diagnosis Date Noted   Ischemic enteritis (HCC) 04/05/2024   Ventilator dependence (HCC) 04/05/2024   [place] Agitation Sedation Scale (RASS) score minus 4, deep sedation 04/05/2024   Colitis 04/04/2024   Adrenal nodule (HCC) 03/03/2020   Acute appendicitis 03/02/2020   A-fib (HCC)    Hypertension    Chronic diastolic CHF (congestive heart failure) (HCC)    Hyponatremia    PCP:  Pa, [facility] Pharmacy:   [facility] [phone] - [place], [place] - [address] AT [address] OF [address]  [place] [address] Phone: [phone] Fax: [phone]     Social Drivers of Health (SDOH) Social History: SDOH Screenings   Food Insecurity: No Food Insecurity (04/04/2024)  Housing: Low Risk  (04/04/2024)  Transportation Needs: No  Transportation Needs (04/04/2024)  Utilities: Not At Risk (04/04/2024)  Financial Resource Strain: Low Risk  (03/13/2019)   [facility]  Social Connections: Moderately Integrated (04/04/2024)  Tobacco Use: Medium Risk  (04/05/2024)   SDOH Interventions:     Readmission Risk Interventions    04/06/2024    3:33 PM  Readmission Risk Prevention Plan  Transportation Screening Complete  PCP or Specialist Appt within 5-7 Days Complete  Home Care Screening Complete  Medication Review (RN CM) Complete

## 2024-04-06 NOTE — Progress Notes (Signed)
 NAME:  Donald [name] [name], MRN:  [id number], DOB:  Dec 25, 1932, LOS: 2 ADMISSION DATE:  04/03/2024, CONSULTATION DATE:  04/05/2024 REFERRING MD:  Dr. Camellia Blush, CHIEF COMPLAINT:  ischemic small bowel   History of Present Illness:  Donald [name] [name] is Donald [name] 89 year old [name] with PMH significant for Afib on Eliquis , CAD s/p CABGx3 (1998), HTN and HLD who presented on 04/03/2024 with 1 week of diarrhea and right lower quadrant abdominal pain. 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 Patient was admitted for further management and general surgery was consulted. Initially patient was managed conservatively on the floor but giving ongoing abdominal pain, worsening leukocytosis and persistent tachycardia the decision was made to proceed with diagnostic ex-lap on 8/20. Ex-lap showed small bowel ischemia and he underwent small bowel resection x 2 and appendectomy. He had other areas suspicious for ischemia so he was left open in discontinuity with plan to return for Donald [name] second look on Friday 8/22. He was brought to ICU for ventilator management.   Pertinent  Medical History  Afib on Eliquis   CAD s/p CABGx3 (1998)  HTN HLD  Significant Hospital Events: Including procedures, antibiotic start and stop dates in addition to other pertinent events   04/03/2024: admitted for ischemic versus infectious enteritis 04/05/2024: OR for diagnostic ex-lap with ischemic small bowel s/p small bowel resection x 2, other concerning areas for ischemia, left open and in discontinuity. Transferred to ICU on ventilator.  Interim History / Subjective:  No events overnight  Objective    Blood pressure (!) 100/56, pulse 91, temperature 98.8 F (37.1 C), temperature source Axillary, resp. rate 18, height 5' 6.5 (1.689 m), weight 77.1 kg, SpO2 100%.    Vent Mode: PRVC FiO2 (%):  [40  %-100 %] 40 % Set Rate:  [18 bmp] 18 bmp Vt Set:  [510 mL] 510 mL PEEP:  [5 cmH20] 5 cmH20 Plateau Pressure:  [20 cmH20] 20 cmH20   Intake/Output Summary (Last 24 hours) at 04/06/2024 0851 Last data filed at 04/06/2024 0449 Gross per 24 hour  Intake 4529.57 ml  Output 950 ml  Net 3579.57 ml   Filed Weights   04/04/24 1213 04/05/24 1500 04/06/24 0433  Weight: 74.1 kg 75.2 kg 77.1 kg   Examination: Propofol  40, fent 50 General:  Donald [name] [name] sedated on MV in NAD HEENT: MM pink/dry, ETT/ OGT- dk ?coffee ground, pupils 3/r, anicteric Neuro: sedated, RASS -4 CV: irir, afib, rate controlled ~100, PICC RUE PULM:  MV supported, clear, no wheeze, no secretions GI: soft, few BS, lower midline incision with wound VAC with serosang drainage, foley- dk bloody with few clots Extremities: warm/dry, trace LE edema  Skin: no rashes  Afebrile UOP /12hrs Wound vac 150 ml/ 12hrs +3.5L/ 24hrs Net +6.5L  Labs> Na 131, K 3.4, sCr 0.5, BUN 15, phos 2.1, mag 1.9, tricyclerides 117, lactic 2> 1.4, WBC 13.3> 17.9, H/H 10.1/ 33> 9.4/ 28, hep level 0.73 CBG 149- 226 overnight GI panel neg  Resolved problem list   Assessment and Plan   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 Given additional concerning areas for ischemia now open abdomen and in discontinuity with plan to return to  OR 8/22 for second look.   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  Discussed with CCS who will formally consult VVS and transfer to [facility]/ 2H now - heparin  per pharmacy  - lactic cleared overnight   Post op respiratory insufficiency related to above Right upper PNA R - cont full LTVV support, 4-8cc/kg IBW with goal Pplat <30 and DP<15  - VAP prevention protocol/ PPI - PAD protocol for sedation> fent/ propofol  with RASS goal -4/-5 with open abd> do not wean - send trach asp, cont abx as above  - CT chest w/o contrast to further evaluate> CT showed 5.7x 4.1x 6 cm RUL mass c/w primary lung carcinoma with mets to right hilar, preicarinal, and right paratracheal nodes, RUL infiltrate c/w bronchopneumonia, moderate layering right pleural effusion, small left pleural effusion  - goal sat SpO2 >92% - prn BD  Chronic Afib  On Eliquis  at home, last dose 8/18 PM - remains rate controlled - optimize electrolytes - pending echo - heparin  per pharmacy   Sepsis 2/2 R pneumonia/ ischemic bowel - afebrile, stable leukocytosis - lactic cleared  - trend WBC/ fever curve - remains hemodynamically stable, not requiring pressors - sCr remains stable, trend renal indices   Hematuria - dark with few clot but decent stable urine output> monitor for obstruction/ retention, consider urology consult if increasing, as remains on heparin  - trend renal indices   Anemia  - recheck H/H at 1600, monitor output - trend CBC - PPI  At risk for malnutrition - NPO.  TPN via PICC per pharmacy   Hypophosphatemia  Hypokalemia Hyponatremia- improved - kphos, trend/ replete electrolytes prn   Hyperglycemia - prn SSI/ CBG q4  Best Practice (right click and Reselect all SmartList Selections daily)   Diet/type: NPO DVT prophylaxis  systemic heparin   Pressure ulcer(s): N/Donald [name] GI prophylaxis: PPI Lines: RUL PICC Foley:  Yes, and it is still needed Code Status:  full code Last date of multidisciplinary goals of care discussion [8/20]  Daughter Marc Leichter updated by phone 8/21 on events and planned transfer to [facility] and formal VSS consult, verbalized wife has alzheimer's.    Labs   CBC: Recent Labs  Lab 04/03/24 2011 04/04/24 0850 04/05/24 0620 04/05/24 1157 04/05/24 1710 04/06/24 0420  WBC 15.8* 17.7* 19.9*  --  13.3* 17.9*  HGB 12.2* 11.0* 11.4* 11.6* 10.1* 9.4*  HCT 35.5* 33.8* 34.8* 34.0* 33.1* 28.5*  MCV 94.9 96.3 98.3  --  102.2* 98.3  PLT 212 207 265  --  198 196    Basic Metabolic Panel: Recent Labs  Lab 04/04/24 0850 04/04/24 1237 04/05/24 0620 04/05/24 1012 04/05/24 1157 04/05/24 1710 04/06/24 0420  NA 129* 130* 128*  --  131* 134* 131*  K 3.6 3.1* 3.7  --  4.1 4.2 3.4*  CL 95* 94* 93*  --  100 103 101  CO2 23 22  20*  --   --  19* 22  GLUCOSE 114* 119* 90  --  101* 123* 227*  BUN 12 11 12   --  14 16 15   CREATININE 0.34* 0.41* 0.52*  --  0.40* 0.55* 0.50*  CALCIUM  8.1* 8.4* 8.6*  --   --  8.6* 8.3*  MG 1.6*  --   --  1.6*  --   --  1.9  PHOS  --   --   --  2.1*  --   --  2.1*   GFR: Estimated Creatinine Clearance: 57.6 mL/min (Donald [name]) (by C-G formula based on SCr of 0.5 mg/dL (L)). Recent Labs  Lab 04/04/24 0850 04/04/24 1237 04/05/24 0620 04/05/24 0812 04/05/24 1710 04/05/24 2128 04/06/24 0420  WBC 17.7*  --  19.9*  --  13.3*  --  17.9*  LATICACIDVEN  --    < >  --  1.2 2.2* 2.0* 1.4   < > = values in this interval not displayed.    Liver Function Tests: Recent Labs  Lab 04/03/24 2011 04/05/24 1710  AST 16 25  ALT 17 18  ALKPHOS 52 45  BILITOT 1.4* 2.2*  PROT 7.0 5.4*  ALBUMIN  3.2* 2.7*   Recent Labs  Lab 04/03/24 2011  LIPASE 29   No results for input(s): AMMONIA in the last 168 hours.  ABG    Component Value Date/Time   PHART 7.38 04/05/2024 1618   PCO2ART  27 (L) 04/05/2024 1618   PO2ART 435 (H) 04/05/2024 1618   HCO3 16.2 (L) 04/05/2024 1618   TCO2 19 (L) 04/05/2024 1157   ACIDBASEDEF 7.6 (H) 04/05/2024 1618   O2SAT 100 04/05/2024 1618     Coagulation Profile: No results for input(s): INR, PROTIME in the last 168 hours.  Cardiac Enzymes: No results for input(s): CKTOTAL, CKMB, CKMBINDEX, TROPONINI in the last 168 hours.  HbA1C: No results found for: HGBA1C  CBG: Recent Labs  Lab 04/05/24 1529 04/05/24 2015 04/06/24 0021 04/06/24 0406 04/06/24 0747  GLUCAP 117* 149* 215* 226* 216*   Allergies Allergies  Allergen Reactions   Bee Venom Other (See Comments)    Other reaction(s): fainting, decreased BP   Chromium Other (See Comments)    rash   Gabapentin  Other (See Comments)    Can't take capsules, tablets are tolerated 7/18 pt states allergy is resolved now, can take capsules    Sulfa Antibiotics Rash     Home Medications  Prior to Admission medications   Medication Sig Start Date End Date Taking? Authorizing Provider  apixaban  (ELIQUIS ) 5 MG TABS tablet Take 5 mg by mouth 2 (two) times daily.   Yes [provider]  Ascorbic Acid (VITAMIN C) 500 MG CAPS Take 500 capsules by mouth daily.   Yes [provider]  atorvastatin  (LIPITOR) 40 MG tablet Take 40 mg by mouth daily.   Yes [provider]  cholecalciferol (VITAMIN D3) 25 MCG (1000 UNIT) tablet Take 1,000 Units by mouth daily.   Yes [provider]  furosemide  (LASIX ) 40 MG tablet Take 20-40 mg by mouth See admin instructions. Takes 1 tablet in the morning 1/2 tablet at 2 pm then another 1/2 tablet at 6 pm   Yes [provider]  gabapentin  (NEURONTIN ) 800 MG tablet Take 400 mg by mouth 3 (three) times daily.   Yes [provider]  losartan  (COZAAR ) 100 MG tablet Take 100 mg by mouth daily.   Yes [provider]  metoprolol  succinate (TOPROL -XL) 100 MG 24  hr tablet Take 100 mg by mouth daily.  03/20/24  Yes [provider]     Critical care time: 48 minutes       Lyle Pesa, MSN, AG-ACNP-BC [facility] 04/06/2024, 8:51 AM  See Amion for pager If no response to pager , please call [phone] until 7pm After 7:00 pm call Elink  [phone]

## 2024-04-07 ENCOUNTER — Other Ambulatory Visit: Payer: Self-pay

## 2024-04-07 ENCOUNTER — Inpatient Hospital Stay (HOSPITAL_COMMUNITY): Admitting: Certified Registered Nurse Anesthetist

## 2024-04-07 ENCOUNTER — Encounter (HOSPITAL_COMMUNITY): Payer: Self-pay | Admitting: Vascular Surgery

## 2024-04-07 ENCOUNTER — Encounter (HOSPITAL_COMMUNITY): Admission: EM | Disposition: E | Payer: Self-pay | Source: Home / Self Care | Attending: Internal Medicine

## 2024-04-07 DIAGNOSIS — A419 Sepsis, unspecified organism: Secondary | ICD-10-CM

## 2024-04-07 DIAGNOSIS — K55059 Acute (reversible) ischemia of intestine, part and extent unspecified: Secondary | ICD-10-CM

## 2024-04-07 DIAGNOSIS — I2510 Atherosclerotic heart disease of native coronary artery without angina pectoris: Secondary | ICD-10-CM

## 2024-04-07 DIAGNOSIS — K559 Vascular disorder of intestine, unspecified: Secondary | ICD-10-CM

## 2024-04-07 DIAGNOSIS — Z9889 Other specified postprocedural states: Secondary | ICD-10-CM

## 2024-04-07 DIAGNOSIS — I5032 Chronic diastolic (congestive) heart failure: Secondary | ICD-10-CM

## 2024-04-07 DIAGNOSIS — J90 Pleural effusion, not elsewhere classified: Secondary | ICD-10-CM

## 2024-04-07 DIAGNOSIS — I110 Hypertensive heart disease with heart failure: Secondary | ICD-10-CM

## 2024-04-07 DIAGNOSIS — R319 Hematuria, unspecified: Secondary | ICD-10-CM

## 2024-04-07 DIAGNOSIS — I952 Hypotension due to drugs: Secondary | ICD-10-CM

## 2024-04-07 DIAGNOSIS — R739 Hyperglycemia, unspecified: Secondary | ICD-10-CM

## 2024-04-07 DIAGNOSIS — Z9911 Dependence on respirator [ventilator] status: Secondary | ICD-10-CM | POA: Diagnosis not present

## 2024-04-07 DIAGNOSIS — I482 Chronic atrial fibrillation, unspecified: Secondary | ICD-10-CM

## 2024-04-07 HISTORY — PX: LAPAROTOMY: SHX154

## 2024-04-07 HISTORY — PX: BOWEL RESECTION: SHX1257

## 2024-04-07 LAB — CBC
HCT: 29.2 % — ABNORMAL LOW (ref 39.0–52.0)
HCT: 27.8 % — ABNORMAL LOW (ref 39.0–52.0)
Hemoglobin: 9.6 g/dL — ABNORMAL LOW (ref 13.0–17.0)
Hemoglobin: 9.1 g/dL — ABNORMAL LOW (ref 13.0–17.0)
MCH: 32.3 pg (ref 26.0–34.0)
MCH: 32.3 pg (ref 26.0–34.0)
MCHC: 32.9 g/dL (ref 30.0–36.0)
MCHC: 32.7 g/dL (ref 30.0–36.0)
MCV: 98.3 fL (ref 80.0–100.0)
MCV: 98.6 fL (ref 80.0–100.0)
Platelets: 270 K/uL (ref 150–400)
Platelets: 245 K/uL (ref 150–400)
RBC: 2.97 MIL/uL — ABNORMAL LOW (ref 4.22–5.81)
RBC: 2.82 MIL/uL — ABNORMAL LOW (ref 4.22–5.81)
RDW: 12.2 % (ref 11.5–15.5)
RDW: 12.3 % (ref 11.5–15.5)
WBC: 27.3 K/uL — ABNORMAL HIGH (ref 4.0–10.5)
WBC: 17.6 K/uL — ABNORMAL HIGH (ref 4.0–10.5)
nRBC: 0 % (ref 0.0–0.2)
nRBC: 0 % (ref 0.0–0.2)

## 2024-04-07 LAB — BASIC METABOLIC PANEL WITH GFR
Anion gap: 11 (ref 5–15)
Anion gap: 8 (ref 5–15)
BUN: 13 mg/dL (ref 8–23)
BUN: 16 mg/dL (ref 8–23)
CO2: 21 mmol/L — ABNORMAL LOW (ref 22–32)
CO2: 21 mmol/L — ABNORMAL LOW (ref 22–32)
Calcium: 8.3 mg/dL — ABNORMAL LOW (ref 8.9–10.3)
Calcium: 8.3 mg/dL — ABNORMAL LOW (ref 8.9–10.3)
Chloride: 104 mmol/L (ref 98–111)
Chloride: 105 mmol/L (ref 98–111)
Creatinine, Ser: 0.47 mg/dL — ABNORMAL LOW (ref 0.61–1.24)
Creatinine, Ser: 0.46 mg/dL — ABNORMAL LOW (ref 0.61–1.24)
GFR, Estimated: >60 mL/min (ref >60)
GFR, Estimated: >60 mL/min (ref >60)
Glucose, Bld: 179 mg/dL — ABNORMAL HIGH (ref 70–99)
Glucose, Bld: 184 mg/dL — ABNORMAL HIGH (ref 70–99)
Potassium: 4.2 mmol/L (ref 3.5–5.1)
Potassium: 4.2 mmol/L (ref 3.5–5.1)
Sodium: 136 mmol/L (ref 135–145)
Sodium: 134 mmol/L — ABNORMAL LOW (ref 135–145)

## 2024-04-07 LAB — HEPARIN LEVEL (UNFRACTIONATED)
Heparin Unfractionated: 0.3 [IU]/mL (ref 0.30–0.70)
Heparin Unfractionated: 0.35 [IU]/mL (ref 0.30–0.70)

## 2024-04-07 LAB — POCT I-STAT 7, (LYTES, BLD GAS, ICA,H+H)
Acid-base deficit: 3 mmol/L — ABNORMAL HIGH (ref 0.0–2.0)
Bicarbonate: 22.2 mmol/L (ref 20.0–28.0)
Calcium, Ion: 1.27 mmol/L (ref 1.15–1.40)
HCT: 25 % — ABNORMAL LOW (ref 39.0–52.0)
Hemoglobin: 8.5 g/dL — ABNORMAL LOW (ref 13.0–17.0)
O2 Saturation: 99 %
Patient temperature: 97.9
Potassium: 4.3 mmol/L (ref 3.5–5.1)
Sodium: 136 mmol/L (ref 135–145)
TCO2: 23 mmol/L (ref 22–32)
pCO2 arterial: 37.1 mmHg (ref 32–48)
pH, Arterial: 7.384 (ref 7.35–7.45)
pO2, Arterial: 152 mmHg — ABNORMAL HIGH (ref 83–108)

## 2024-04-07 LAB — GLUCOSE, CAPILLARY
Glucose-Capillary: 161 mg/dL — ABNORMAL HIGH (ref 70–99)
Glucose-Capillary: 163 mg/dL — ABNORMAL HIGH (ref 70–99)
Glucose-Capillary: 149 mg/dL — ABNORMAL HIGH (ref 70–99)
Glucose-Capillary: 147 mg/dL — ABNORMAL HIGH (ref 70–99)
Glucose-Capillary: 188 mg/dL — ABNORMAL HIGH (ref 70–99)

## 2024-04-07 LAB — MAGNESIUM
Magnesium: 2.2 mg/dL (ref 1.7–2.4)
Magnesium: 2 mg/dL (ref 1.7–2.4)

## 2024-04-07 LAB — SURGICAL PATHOLOGY

## 2024-04-07 LAB — CG4 I-STAT (LACTIC ACID): Lactic Acid, Venous: 0.9 mmol/L (ref 0.5–1.9)

## 2024-04-07 LAB — APTT
aPTT: 59 s — ABNORMAL HIGH (ref 24–36)
aPTT: 71 s — ABNORMAL HIGH (ref 24–36)

## 2024-04-07 LAB — LACTIC ACID, PLASMA: Lactic Acid, Venous: 1.3 mmol/L (ref 0.5–1.9)

## 2024-04-07 LAB — PHOSPHORUS
Phosphorus: 2.6 mg/dL (ref 2.5–4.6)
Phosphorus: 2.6 mg/dL (ref 2.5–4.6)

## 2024-04-07 SURGERY — LAPAROTOMY, EXPLORATORY
Anesthesia: General | Site: Abdomen

## 2024-04-07 MED ORDER — ROCURONIUM BROMIDE 10 MG/ML (PF) SYRINGE
PREFILLED_SYRINGE | INTRAVENOUS | Status: DC | PRN
  Administered 2024-04-07: 100 mg via INTRAVENOUS

## 2024-04-07 MED ORDER — INSULIN ASPART 100 UNIT/ML IJ SOLN
0.0000 [IU] | INTRAMUSCULAR | Status: DC
  Administered 2024-04-07: 3 [IU] via SUBCUTANEOUS
  Administered 2024-04-07: 4 [IU] via SUBCUTANEOUS
  Administered 2024-04-07: 3 [IU] via SUBCUTANEOUS
  Administered 2024-04-08 (×6): 4 [IU] via SUBCUTANEOUS
  Administered 2024-04-09: 7 [IU] via SUBCUTANEOUS
  Administered 2024-04-09 (×3): 4 [IU] via SUBCUTANEOUS
  Administered 2024-04-09: 7 [IU] via SUBCUTANEOUS

## 2024-04-07 MED ORDER — TRAVASOL 10 % IV SOLN
INTRAVENOUS | Status: AC
  Filled 2024-04-07: qty 789.6

## 2024-04-07 MED ORDER — NOREPINEPHRINE BITARTRATE 1 MG/ML IV SOLN
INTRAVENOUS | Status: DC | PRN
  Administered 2024-04-07: .5 mL via INTRAVENOUS

## 2024-04-07 MED ORDER — NOREPINEPHRINE 4 MG/250ML-% IV SOLN
0.0000 ug/min | INTRAVENOUS | Status: DC
  Administered 2024-04-07: 2 ug/min via INTRAVENOUS
  Filled 2024-04-07: qty 250

## 2024-04-07 MED ORDER — ALBUMIN HUMAN 5 % IV SOLN: INTRAVENOUS | Status: DC | PRN

## 2024-04-07 MED ORDER — 0.9 % SODIUM CHLORIDE (POUR BTL) OPTIME
TOPICAL | Status: DC | PRN
  Administered 2024-04-07: 2000 mL
  Administered 2024-04-07: 1000 mL

## 2024-04-07 MED ORDER — PHENYLEPHRINE HCL-NACL 20-0.9 MG/250ML-% IV SOLN
0.0000 ug/min | INTRAVENOUS | Status: DC
  Administered 2024-04-07: 20 ug/min via INTRAVENOUS
  Filled 2024-04-07: qty 250

## 2024-04-07 MED ORDER — PHENYLEPHRINE HCL-NACL 20-0.9 MG/250ML-% IV SOLN
INTRAVENOUS | Status: AC
  Administered 2024-04-07: 20 ug/min via INTRAVENOUS
  Filled 2024-04-07: qty 250

## 2024-04-07 MED ORDER — LACTATED RINGERS IV SOLN: INTRAVENOUS | Status: AC

## 2024-04-07 SURGICAL SUPPLY — 32 items
BNDG GAUZE DERMACEA FLUFF 4 (GAUZE/BANDAGES/DRESSINGS) IMPLANT
CANISTER SUCTION 3000ML PPV (SUCTIONS) ×1 IMPLANT
COVER SURGICAL LIGHT HANDLE (MISCELLANEOUS) ×1 IMPLANT
DRAPE LAPAROSCOPIC ABDOMINAL (DRAPES) ×1 IMPLANT
DRAPE WARM FLUID 44X44 (DRAPES) ×1 IMPLANT
ELECT BLADE 6.5 EXT (BLADE) IMPLANT
ELECT CAUTERY BLADE 6.4 (BLADE) ×1 IMPLANT
ELECTRODE REM PT RTRN 9FT ADLT (ELECTROSURGICAL) ×1 IMPLANT
GAUZE SPONGE 4X4 12PLY STRL (GAUZE/BANDAGES/DRESSINGS) IMPLANT
GLOVE BIO SURGEON STRL SZ7 (GLOVE) ×1 IMPLANT
GLOVE BIOGEL PI IND STRL 7.5 (GLOVE) ×1 IMPLANT
GOWN STRL REUS W/ TWL LRG LVL3 (GOWN DISPOSABLE) ×2 IMPLANT
GOWN STRL REUS W/ TWL XL LVL3 (GOWN DISPOSABLE) ×1 IMPLANT
HANDLE SUCTION POOLE (INSTRUMENTS) ×1 IMPLANT
KIT BASIN OR (CUSTOM PROCEDURE TRAY) ×1 IMPLANT
KIT TURNOVER KIT B (KITS) ×1 IMPLANT
LIGASURE IMPACT 36 18CM CVD LR (INSTRUMENTS) IMPLANT
NS IRRIG 1000ML POUR BTL (IV SOLUTION) ×2 IMPLANT
PACK GENERAL/GYN (CUSTOM PROCEDURE TRAY) ×1 IMPLANT
PACK SPY-PHI (KITS) IMPLANT
PAD ARMBOARD POSITIONER FOAM (MISCELLANEOUS) ×1 IMPLANT
RELOAD STAPLE 75 3.8 BLU REG (ENDOMECHANICALS) IMPLANT
SPONGE T-LAP 18X18 ~~LOC~~+RFID (SPONGE) IMPLANT
STAPLER GUN LINEAR PROX 60 (STAPLE) IMPLANT
STAPLER PROXIMATE 75MM BLUE (STAPLE) IMPLANT
SUT PDS AB 1 TP1 54 (SUTURE) IMPLANT
SUT SILK 2 0 SH CR/8 (SUTURE) ×1 IMPLANT
SUT SILK 2 0 TIES 10X30 (SUTURE) ×1 IMPLANT
SUT SILK 3 0 SH CR/8 (SUTURE) ×1 IMPLANT
SUT SILK 3 0 TIES 10X30 (SUTURE) ×1 IMPLANT
TOWEL GREEN STERILE (TOWEL DISPOSABLE) ×1 IMPLANT
YANKAUER SUCT BULB TIP NO VENT (SUCTIONS) IMPLANT

## 2024-04-07 NOTE — Anesthesia Postprocedure Evaluation (Signed)
 Anesthesia Post Note  Patient: Donald [name]  Procedure(s) Performed: AORTOGRAM (Groin) INSERTION, STENT OF SUPERIOR MESENTERIC ARTERY USING X VIABAHN VBX (Groin) THROMBECTOMY, MECHANICAL WITH PENUMBRA ULTRASOUND GUIDANCE, FOR VASCULAR ACCESS ANGIOGRAM SUPERIOR MESENTERIC ARTERY (Groin)     Patient location during evaluation: SICU Anesthesia Type: General Level of consciousness: patient remains intubated per anesthesia plan and sedated Pain management: pain level controlled Vital Signs Assessment: post-procedure vital signs reviewed and stable Respiratory status: patient on ventilator - see flowsheet for VS and patient remains intubated per anesthesia plan Cardiovascular status: stable Postop Assessment: no apparent nausea or vomiting Anesthetic complications: no Comments: Pt returned to OR today for ex-lap eval of mesenteric ischemia: remains in critical condition   No notable events documented.  Last Vitals:  Vitals:   04/07/24 1215 04/07/24 1607  BP:    Pulse: 89 (!) 104  Resp: 18 17  Temp:  36.6 C  SpO2: 100% 100%    Last Pain:  Vitals:   04/07/24 1607  TempSrc: Oral  PainSc:                  Donald [name],E. Mileena Rothenberger

## 2024-04-07 NOTE — Progress Notes (Addendum)
  Progress Note    04/07/2024 7:19 AM 1 Day Post-Op  Subjective:  intubated and sedated   Vitals:   04/07/24 0300 04/07/24 0400  BP:    Pulse: 99   Resp: 18 18  Temp:  98.3 F (36.8 C)  SpO2: 100%    Physical Exam: Cardiac:  tachy, irregular Lungs:  intubated Incisions:  midline incision with VAC; R CF access site without swelling or hematoma  Extremities:  2+ radial pulses, 2+ AT pulses bilaterally Abdomen:  VAC in place, mildly distended, soft Neurologic: sedated  CBC    Component Value Date/Time   WBC 27.3 (H) 04/07/2024 0238   RBC 2.97 (L) 04/07/2024 0238   HGB 9.6 (L) 04/07/2024 0238   HCT 29.2 (L) 04/07/2024 0238   PLT 270 04/07/2024 0238   MCV 98.3 04/07/2024 0238   MCH 32.3 04/07/2024 0238   MCHC 32.9 04/07/2024 0238   RDW 12.2 04/07/2024 0238   LYMPHSABS 1.5 03/06/2020 0204   MONOABS 0.7 03/06/2020 0204   EOSABS 0.1 03/06/2020 0204   BASOSABS 0.0 03/06/2020 0204    BMET    Component Value Date/Time   NA 136 04/07/2024 0238   K 4.2 04/07/2024 0238   CL 104 04/07/2024 0238   CO2 21 (L) 04/07/2024 0238   GLUCOSE 179 (H) 04/07/2024 0238   BUN 13 04/07/2024 0238   CREATININE 0.47 (L) 04/07/2024 0238   CALCIUM  8.3 (L) 04/07/2024 0238   GFRNONAA >60 04/07/2024 0238   GFRAA >60 03/06/2020 0204    INR No results found for: INR   Intake/Output Summary (Last 24 hours) at 04/07/2024 0719 Last data filed at 04/07/2024 0600 Gross per 24 hour  Intake 4480.78 ml  Output 2031 ml  Net 2449.78 ml     Assessment/Plan:  89 y.o. male is s/p Abdominal aortogram, Mechanical thrombectomy of SMA, SMA angioplasty and stenting 1 Day Post-Op   Remains critically ill, intubated in sedated No family at bedside On Neo  R CF access site without swelling or hematoma H&H stable Leukocytosis 27.3K Aspirin  81 mg daily Continue Hep. Gtt. Transition to DOAC on d/c Scheduled for OR today with General Surgery  Teretha Charlyne [facility] [phone] 04/07/2024 7:19 AM  VASCULAR STAFF ADDENDUM: I have independently interviewed and examined the patient. I agree with the above.  Optimized from a vascular standpoint. Will assist as able going forward. Start DAPT as soon as safe from GS perspective. Until then continue ASA / heparin .  Debby SAILOR. Magda, MD [facility] Phone Number: [phone] 04/07/2024 3:04 PM

## 2024-04-07 NOTE — Progress Notes (Signed)
 Nutrition Follow-up  DOCUMENTATION CODES:   Severe malnutrition in context of acute illness/injury  INTERVENTION:   Continue TPN Standard MVI and Trace minerals Continue IV Thiamine  100 mg x 5 days Nutrition needs adjusted given change in status, increased surgical stress and high wound vac output. Relayed to TPN Pharmacist Holding lipids in TPN while on Propofol  (currently infusing at 22.1 ml/hr providing 583 kcals in 24 hours)    NUTRITION DIAGNOSIS:   Severe Malnutrition related to acute illness as evidenced by mild fat depletion, moderate fat depletion, moderate muscle depletion, energy intake < or equal to 50% for > or equal to 5 days.  Being addressed via TPN  GOAL:   Patient will meet greater than or equal to 90% of their needs  Progressing  MONITOR:   Labs, Vent status, Weight trends, I & O's, Skin (TPN)  REASON FOR ASSESSMENT:   Consult New TPN/TNA  ASSESSMENT:   89 y.o. male with medical history significant for atrial fibrillation on Eliquis , CAD status post CABG, hypertension, hyperlipidemia, prior appendicitis with microperforation being admitted to the hospital with 1 week of diarrhea and right lower quadrant abdominal pain.  8/18 Admitted 8/20 TPN initiated, OR: ex lap with SBR x 2, open abdomen in discontinuity 8/21 Acute mesenteric ischemia; OR with Vascular Surgery: abdominal aortogram, mechanical thrombectomy of SMA, SMA angioplasty and stenting, remains in discontinuity, abdomen open, Abthera wound VAC  Pt sedated on vent support, noted tentative plan to return to OR today for further exploration with surgical plan pending intra-op findings Propofol : 22.1 ml/hr (583 kcals in 24 hours at current rate) Phenylephrine  down to 15  Pt's wife, daughter and several grand kids at bedside.   Minimal oral intake with significant diarrhea x 8 days PTA. Prior to this acute period, family reports pt eating well. Pt eating 3 meals per day with ice cream as a  snack each night.   Abdomen open, Abthera wound VAC with 1240 mL in 24 hours (plan to include protein losses via exudate in new adjusted needs) UOP 791 mL in 24 hours  Weight up post-op, edema present on exam Current Wt: 77.1 kg (170 pounds) Admission Wt: 74.1 kg (163.4 pounds) UBW around 165 pounds, weighed 164.2 pounds at MD visit on 01/17/24  TPN increasing to 70 ml/hr this evening, currently at 40 ml/hr Noted LR at 75 ml/hr ordered x 12 hours Receiving lipids via Propofol  infusion, lipids being withheld from TPN at this time secondary to this   Pt had a fall in March with torn ligament in R Knee; pt in wheelchair for 4 weeks post fall, currently ambulating slowly with walker, works with PT 2x weekly. Higher degree of muscle wasting in BLE on exam explained by decreased mobility.   Labs: CBGs 149-193 (goal 140-180) Serum Glucose 179  Corrected Sodium 137-138 (wdl) Potassium 4.2 (wdl) BUN 13 (wdl) Creatinine 0.47 (L) Phosphorus 2.6 (wdl) Magnesium  2.2 (wdl) Ionized calcium  1.23 (wdl)   Meds: IV Cefepime  IV Flagyl  IV Pepcid  SS Novolog  LR at 75 ml/hr x 12 hours Thiamine  100 mg IV x 5 days  NUTRITION - FOCUSED PHYSICAL EXAM:  Flowsheet Row Most Recent Value  Orbital Region Mild depletion  Upper Arm Region Unable to assess  Thoracic and Lumbar Region Moderate depletion  Buccal Region Unable to assess  Temple Region Moderate depletion  Clavicle Bone Region Moderate depletion  Clavicle and Acromion Bone Region Moderate depletion  Scapular Bone Region Moderate depletion  Dorsal Hand Unable to assess  [edema]  Patellar  Region Moderate depletion  Anterior Thigh Region Moderate depletion  Posterior Calf Region Severe depletion  Edema (RD Assessment) Mild  Hair Reviewed  Eyes Unable to assess  Mouth Unable to assess  Skin Other (Comment)  [ecchymosis]  Nails Reviewed    Diet Order:   Diet Order             Diet NPO time specified  Diet effective now                    EDUCATION NEEDS:   Education needs have been addressed (Discussed nutrition poc in detail with pt's family)  Skin:  Skin Assessment: Skin Integrity Issues: Skin Integrity Issues:: Other (Comment), Incisions, Wound VAC Wound Vac: open abdomen (Abthera) Incisions: groin (closed) Other: Ecchymosis noted in multiple areas  Last BM:  8/20 -type 7  Height:   Ht Readings from Last 1 Encounters:  04/05/24 5' 6.5 (1.689 m)    Weight:   Wt Readings from Last 1 Encounters:  04/06/24 77.1 kg    BMI:  Body mass index is 27.02 kg/m.  Estimated Nutritional Needs:   Kcal:  1800-1900 kcals  Protein:  105-125 g  Fluid:  >/= 1.8 L   Betsey Finger MS, RDN, LDN, CNSC Registered Dietitian 3 Clinical Nutrition RD Inpatient Contact Info in Amion

## 2024-04-07 NOTE — Progress Notes (Signed)
 NAME:  Donald [name], MRN:  [id number], DOB:  1932/12/13, LOS: 3 ADMISSION DATE:  04/03/2024, CONSULTATION DATE:  04/05/2024 REFERRING MD:  Dr. Camellia Blush, CHIEF COMPLAINT:  ischemic small bowel   History of Present Illness:  Donald [name] is a 89 year old male with PMH significant for Afib on Eliquis , CAD s/p CABGx3 (1998), HTN and HLD who presented on 04/03/2024 with 1 week of diarrhea and right lower quadrant abdominal pain. 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 Patient was admitted for further management and general surgery was consulted. Initially patient was managed conservatively on the floor but giving ongoing abdominal pain, worsening leukocytosis and persistent tachycardia the decision was made to proceed with diagnostic ex-lap on 8/20. Ex-lap showed small bowel ischemia and he underwent small bowel resection x 2 and appendectomy. He had other areas suspicious for ischemia so he was left open in discontinuity with plan to return for a second look on Friday 8/22. He was brought to ICU for ventilator management.   Pertinent  Medical History  Afib on Eliquis   CAD s/p CABGx3 (1998)  HTN HLD  Significant Hospital Events: Including procedures, antibiotic start and stop dates in addition to other pertinent events   04/03/2024: admitted for ischemic versus infectious enteritis 04/05/2024: OR for diagnostic ex-lap with ischemic small bowel s/p small bowel resection x 2, other concerning areas for ischemia, left open and in discontinuity. Transferred to ICU on ventilator. 8/21 OR w [facility] for SMA thrombectomy and angioplasty 8/22 return OR w [facility]   Interim History / Subjective:  Low dose pressors overnight  BMP this morning grossly normal CBGs high end of acceptable  WB C bump to 27. Hgb 9.6.  LA normalized at 1.3   Objective    Blood  pressure 120/63, pulse 86, temperature 98.2 F (36.8 C), temperature source Axillary, resp. rate 18, height 5' 6.5 (1.689 m), weight 77.1 kg, SpO2 100%.    Vent Mode: PRVC FiO2 (%):  [30 %-40 %] 40 % Set Rate:  [18 bmp] 18 bmp Vt Set:  [510 mL] 510 mL PEEP:  [5 cmH20] 5 cmH20 Plateau Pressure:  [17 cmH20-22 cmH20] 17 cmH20   Intake/Output Summary (Last 24 hours) at 04/07/2024 1057 Last data filed at 04/07/2024 0800 Gross per 24 hour  Intake 3678.87 ml  Output 1656 ml  Net 2022.87 ml   Filed Weights   04/04/24 1213 04/05/24 1500 04/06/24 0433  Weight: 74.1 kg 75.2 kg 77.1 kg   Examination:  General:  critically ill elderly M intubated sedated  HEENT: NCAT ETT secure,  pink mmm  Neuro: Sedated RASS -4  CV: irreg rhythm, s1s2 cap refill < 3 sec  PULM:  CTAb, mechanically ventilated.  GI: Soft abdomen. Vac in place w serosang output  GU: foley  MSK: no acute joint deformity  Skin: pale c/d/w   Resolved problem list   Assessment and Plan    Ischemic bowel s/p ex lap small bowel resection x2 + appendectomy  Sepsis 2/2 above  Hypotension: sedation related vs septic shock Open abdomen -LA has cleared + minimal pressor requirement.  I suspect at this point hypotension may be more related to sedation requirement in setting of open abdomen -WBC has up trended following SMA procedure which may be reactive. Has remained afebrile  P - post op per [facility] -plan for return OR 8/22  -RASS -4  we open abd -OGT LIWS; NPO  -TPN  -cont cefepime  flagyl   -wean neo (on very lose dose of 25mcg/min neo 04/07/24)   SMA occlusion s/p thrombectomy, angioplasty + stenting  P -post op per [facility] -hep gtt ASA   Post op respiratory insufficiency related to above R>L pleural effusion  R lung mass  -R hilar, precarinal and paratracheal nodes.  R - Cont MV support. W RASS -4 2/2 open abd, not appropriate for WUA/SBT yet -VAP, pulm hygiene -chest imaging w RUL mass c/f cancer. Will need follow up  after critical illness course improves   Afib, chronic  -eliquis  at home -rate controlled  P - hep gtt -optimize lytes  Hematuria - improving urine quality  P -monitoring -continues on hep gtt -- if hematuria worsening and c/f CBI need consult urology  -follow UOP  Anemia  - trend CBC  At risk for malnutrition - NPO.  TPN via PICC per pharmacy   Hyperglycemia  P -SSI + correction via TPN if needed   GOC -spoke w pt granddaughter who is an ID physician at [employer]. Sounds like family is very realistic -- critical illness at 89 yo w major surgeries is a difficult journey but hope is to continue aggressive measures and get through this. Sounds like they understand the possibility of further decompensation & appreciate very transparent communication.  -regarding his incidental mass / cf cancer-- hopefully if pt gets through this critical course he will be able to engage in decision making for this, but sounds like QOL is a high priority   Best Practice (right click and Reselect all SmartList Selections daily)   Diet/type: NPO TPN DVT prophylaxis systemic heparin   Pressure ulcer(s): N/A GI prophylaxis: PPI Lines: RUL PICC Foley:  Yes, and it is still needed Code Status:  full code Last date of multidisciplinary goals of care discussion [8/22]   Labs   CBC: Recent Labs  Lab 04/04/24 0850 04/05/24 0620 04/05/24 1157 04/05/24 1710 04/06/24 0420 04/06/24 1456 04/06/24 1610 04/06/24 1728 04/07/24 0238  WBC 17.7* 19.9*  --  13.3* 17.9*  --   --   --  27.3*  HGB 11.0* 11.4*   < > 10.1* 9.4* 10.0* 9.2* 10.2* 9.6*  HCT 33.8* 34.8*   < > 33.1* 28.5* 31.0* 27.0* 30.0* 29.2*  MCV 96.3 98.3  --  102.2* 98.3  --   --   --  98.3  PLT 207 265  --  198 196  --   --   --  270   < > = values in this interval not displayed.    Basic Metabolic Panel: Recent Labs  Lab 04/04/24 0850 04/04/24 1237 04/05/24 0620 04/05/24 1012 04/05/24 1157 04/05/24 1710 04/06/24 0420  04/06/24 1610 04/06/24 1728 04/07/24 0238  NA 129* 130* 128*  --  131* 134* 131* 136 134* 136  K 3.6 3.1* 3.7  --  4.1 4.2 3.4* 3.5 4.0 4.2  CL 95* 94* 93*  --  100 103 101  --   --  104  CO2 23 22 20*  --   --  19* 22  --   --  21*  GLUCOSE 114* 119* 90  --  101* 123* 227*  --   --  179*  BUN 12 11 12   --  14 16 15   --   --  13  CREATININE 0.34* 0.41* 0.52*  --  0.40* 0.55* 0.50*  --   --  0.47*  CALCIUM  8.1* 8.4*  8.6*  --   --  8.6* 8.3*  --   --  8.3*  MG 1.6*  --   --  1.6*  --   --  1.9  --   --  2.2  PHOS  --   --   --  2.1*  --   --  2.1*  --   --  2.6   GFR: Estimated Creatinine Clearance: 57.6 mL/min (A) (by C-G formula based on SCr of 0.47 mg/dL (L)). Recent Labs  Lab 04/05/24 0620 04/05/24 0812 04/05/24 1710 04/05/24 2128 04/06/24 0420 04/07/24 0238  WBC 19.9*  --  13.3*  --  17.9* 27.3*  LATICACIDVEN  --    < > 2.2* 2.0* 1.4 1.3   < > = values in this interval not displayed.    Liver Function Tests: Recent Labs  Lab 04/03/24 2011 04/05/24 1710  AST 16 25  ALT 17 18  ALKPHOS 52 45  BILITOT 1.4* 2.2*  PROT 7.0 5.4*  ALBUMIN  3.2* 2.7*   Recent Labs  Lab 04/03/24 2011  LIPASE 29   No results for input(s): AMMONIA in the last 168 hours.  ABG    Component Value Date/Time   PHART 7.335 (L) 04/06/2024 1728   PCO2ART 42.4 04/06/2024 1728   PO2ART 329 (H) 04/06/2024 1728   HCO3 22.6 04/06/2024 1728   TCO2 24 04/06/2024 1728   ACIDBASEDEF 3.0 (H) 04/06/2024 1728   O2SAT 100 04/06/2024 1728     Coagulation Profile: No results for input(s): INR, PROTIME in the last 168 hours.  Cardiac Enzymes: No results for input(s): CKTOTAL, CKMB, CKMBINDEX, TROPONINI in the last 168 hours.  HbA1C: No results found for: HGBA1C  CBG: Recent Labs  Lab 04/06/24 1816 04/06/24 1949 04/06/24 2333 04/07/24 0405 04/07/24 0758  GLUCAP 162* 193* 183* 161* 163*   CRITICAL CARE Performed by: Ronnald FORBES Gave   Total critical care time: 40  minutes  Critical care time was exclusive of separately billable procedures and treating other patients. Critical care was necessary to treat or prevent imminent or life-threatening deterioration.  Critical care was time spent personally by me on the following activities: development of treatment plan with patient and/or surrogate as well as nursing, discussions with consultants, evaluation of patient's response to treatment, examination of patient, obtaining history from patient or surrogate, ordering and performing treatments and interventions, ordering and review of laboratory studies, ordering and review of radiographic studies, pulse oximetry and re-evaluation of patient's condition.  Ronnald Gave MSN, AGACNP-BC [facility] Amion for pager  04/07/2024, 10:57 AM

## 2024-04-07 NOTE — Progress Notes (Signed)
 PHARMACY - ANTICOAGULATION CONSULT NOTE  Pharmacy Consult for Heparin  Indication: atrial fibrillation (apixaban  on hold), ischemic bowel  Allergies  Allergen Reactions   Bee Venom Other (See Comments)    Other reaction(s): fainting, decreased BP   Chromium Other (See Comments)    rash   Gabapentin  Other (See Comments)    Can't take capsules, tablets are tolerated 7/18 pt states allergy is resolved now, can take capsules    Sulfa Antibiotics Rash    Patient Measurements: Height: 5' 6.5 (168.9 cm) Weight: 77.1 kg (169 lb 15.6 oz) IBW/kg (Calculated) : 64.95 HEPARIN  DW (KG): 75.2  Vital Signs: Temp: 97.9 F (36.6 C) (08/22 1115) Temp Source: Axillary (08/22 1115) Pulse Rate: 89 (08/22 1215)  Labs: Recent Labs    04/05/24 1710 04/06/24 0420 04/06/24 1456 04/06/24 1610 04/06/24 1728 04/07/24 0238 04/07/24 1000  HGB 10.1* 9.4*   < > 9.2* 10.2* 9.6*  --   HCT 33.1* 28.5*   < > 27.0* 30.0* 29.2*  --   PLT 198 196  --   --   --  270  --   APTT  --  101*  --   --   --   --  59*  HEPARINUNFRC  --  0.73*  --   --   --   --  0.30  CREATININE 0.55* 0.50*  --   --   --  0.47*  --    < > = values in this interval not displayed.    Estimated Creatinine Clearance: 57.6 mL/min (A) (by C-G formula based on SCr of 0.47 mg/dL (L)).  Assessment: 10 yoM presented to ED on 8/19 with abdominal pain, found to have ischemic small bowel s/p exploratory/diagnostic laparotmy with appendectomy and resection x2 of small bowel on 8/20.  Pharmacy is consulted to dose Heparin  IV for ischemic bowel and history of Afib on apixaban  prior to admission.  Lase dose given on 8/18 at 17:30. Enoxaparin  prophylaxis started on admission and given on 8/19 at 10:15.  S/p OR for mechanical thrombectomy of superior mesenteric artery 8/21. Heparin  level came back therapeutic at 0.3, aPTT was slightly low at 59, on heparin  infusion at 1000 units/hr. No infusion issues. Hgb 9.6, plt 270 this morning. Plan to go  back to OR this afternoon. No s/sx of bleeding.  Goal of Therapy:  Heparin  level 0.3-0.7 units/ml aPTT 66-102 seconds Monitor platelets by anticoagulation protocol: Yes   Plan:  Given timing prior to OR will continue at same rate for now - will follow up after procedure and consider increase to 1100 units/hr if restarted  Thank you for allowing pharmacy to participate in this patient's care,  Suzen Sour, PharmD, BCCCP Clinical Pharmacist  Phone: [phone] 04/07/2024 1:43 PM  Please check AMION for all [facility] phone numbers After 10:00 PM, call Main Pharmacy [phone]

## 2024-04-07 NOTE — Progress Notes (Signed)
 Progress Note     Subjective: Underwent endovascular thrombectomy and stenting with Dr. Magda. Currently on 25 neo. Remains intubated and sedated.   Objective: Vital signs in last 24 hours: Temp:  [97.8 F (36.6 C)-98.9 F (37.2 C)] 98.2 F (36.8 C) (08/22 0758) Pulse Rate:  [59-132] 86 (08/22 0749) Resp:  [18-20] 18 (08/22 0800) BP: (117-146)/(54-79) 120/63 (08/21 1450) SpO2:  [96 %-100 %] 100 % (08/22 0800) Arterial Line BP: (104-135)/(53-65) 134/58 (08/22 0800) FiO2 (%):  [30 %-100 %] 40 % (08/22 0800) Last BM Date : 04/05/24  Intake/Output from previous day: 08/21 0701 - 08/22 0700 In: 4480.8 [I.V.:3081.3; IV Piggyback:1399.5] Out: 2031 [Urine:791; Drains:1240] Intake/Output this shift: Total I/O In: 346.9 [I.V.:346.9] Out: -   PE: General: sedated on vent Heart: irregular, 90s Lungs: on vent Abd: soft, Abthera VAC present.  Serosang output in cannister.  OGT with some old blood-tinged bilious output GU: foley with some blood-tinged urine   Lab Results:  Recent Labs    04/06/24 0420 04/06/24 1456 04/06/24 1728 04/07/24 0238  WBC 17.9*  --   --  27.3*  HGB 9.4*   < > 10.2* 9.6*  HCT 28.5*   < > 30.0* 29.2*  PLT 196  --   --  270   < > = values in this interval not displayed.   BMET Recent Labs    04/06/24 0420 04/06/24 1610 04/06/24 1728 04/07/24 0238  NA 131*   < > 134* 136  K 3.4*   < > 4.0 4.2  CL 101  --   --  104  CO2 22  --   --  21*  GLUCOSE 227*  --   --  179*  BUN 15  --   --  13  CREATININE 0.50*  --   --  0.47*  CALCIUM  8.3*  --   --  8.3*   < > = values in this interval not displayed.   PT/INR No results for input(s): LABPROT, INR in the last 72 hours. CMP     Component Value Date/Time   NA 136 04/07/2024 0238   K 4.2 04/07/2024 0238   CL 104 04/07/2024 0238   CO2 21 (L) 04/07/2024 0238   GLUCOSE 179 (H) 04/07/2024 0238   BUN 13 04/07/2024 0238   CREATININE 0.47 (L) 04/07/2024 0238   CALCIUM  8.3 (L) 04/07/2024 0238    PROT 5.4 (L) 04/05/2024 1710   ALBUMIN  2.7 (L) 04/05/2024 1710   AST 25 04/05/2024 1710   ALT 18 04/05/2024 1710   ALKPHOS 45 04/05/2024 1710   BILITOT 2.2 (H) 04/05/2024 1710   GFRNONAA >60 04/07/2024 0238   GFRAA >60 03/06/2020 0204   Lipase     Component Value Date/Time   LIPASE 29 04/03/2024 2011       Studies/Results: ECHOCARDIOGRAM LIMITED Result Date: 04/06/2024    ECHOCARDIOGRAM LIMITED REPORT   Patient Name:   Donald [name] Date of Exam: 04/06/2024 Medical Rec #:  [id number]   Height:       66.5 in Accession #:    [id number]  Weight:       170.0 lb Date of Birth:  02/17/1933   BSA:          1.877 m Patient Age:    89 years    BP:           100/56 mmHg Patient Gender: M           HR:  81 bpm. Exam Location:  Inpatient Procedure: Cardiac Doppler and Limited Color Doppler (Both Spectral and Color            Flow Doppler were utilized during procedure). Indications:    A fib  History:        Patient has no prior history of Echocardiogram examinations.                 Arrythmias:Atrial Fibrillation.  Sonographer:    Benard Stallion Referring Phys: [id number] TARA N WILSON IMPRESSIONS  1. Left ventricular ejection fraction, by estimation, is 40 to 45%. The left ventricle has mildly decreased function. The left ventricle demonstrates regional wall motion abnormalities (see scoring diagram/findings for description). There is moderate concentric left ventricular hypertrophy. Left ventricular diastolic function could not be evaluated.  2. Right ventricular systolic function is mildly reduced. There is moderately elevated pulmonary artery systolic pressure. The estimated right ventricular systolic pressure is 45.9 mmHg.  3. Left atrial size was moderately dilated.  4. Right atrial size was mildly dilated.  5. The mitral valve is degenerative. Mild mitral valve regurgitation.  6. The aortic valve is tricuspid. There is moderate calcification of the aortic valve. Aortic valve  sclerosis/calcification is present, without any evidence of aortic stenosis. FINDINGS  Left Ventricle: Left ventricular ejection fraction, by estimation, is 40 to 45%. The left ventricle has mildly decreased function. The left ventricle demonstrates regional wall motion abnormalities. There is moderate concentric left ventricular hypertrophy. Left ventricular diastolic function could not be evaluated. Left ventricular diastolic function could not be evaluated due to atrial fibrillation.  LV Wall Scoring: The mid and distal anterior septum and mid inferoseptal segment are akinetic. The basal anteroseptal segment is hypokinetic. Right Ventricle: Right ventricular systolic function is mildly reduced. There is moderately elevated pulmonary artery systolic pressure. The tricuspid regurgitant velocity is 3.08 m/s, and with an assumed right atrial pressure of 8 mmHg, the estimated right ventricular systolic pressure is 45.9 mmHg. Left Atrium: Left atrial size was moderately dilated. Right Atrium: Right atrial size was mildly dilated. Mitral Valve: The mitral valve is degenerative in appearance. Mild mitral annular calcification. Mild mitral valve regurgitation. Tricuspid Valve: Tricuspid valve regurgitation is mild. Aortic Valve: The aortic valve is tricuspid. There is moderate calcification of the aortic valve. Aortic valve sclerosis/calcification is present, without any evidence of aortic stenosis. Aortic valve mean gradient measures 2.0 mmHg. Aortic valve peak gradient measures 4.2 mmHg. Aortic valve area, by VTI measures 2.24 cm. Pulmonic Valve: Pulmonic valve regurgitation is mild. LEFT VENTRICLE PLAX 2D LVIDd:         4.10 cm LVIDs:         3.10 cm LV PW:         1.10 cm LV IVS:        1.10 cm LVOT diam:     2.30 cm LV SV:         36 LV SV Index:   19 LVOT Area:     4.15 cm  RIGHT VENTRICLE TAPSE (M-mode): 1.6 cm LEFT ATRIUM         Index LA diam:    4.10 cm 2.18 cm/m  AORTIC VALVE AV Area (Vmax):    2.32 cm AV  Area (Vmean):   2.32 cm AV Area (VTI):     2.24 cm AV Vmax:           102.00 cm/s AV Vmean:          69.200 cm/s AV VTI:  0.162 m AV Peak Grad:      4.2 mmHg AV Mean Grad:      2.0 mmHg LVOT Vmax:         57.00 cm/s LVOT Vmean:        38.600 cm/s LVOT VTI:          0.088 m LVOT/AV VTI ratio: 0.54  AORTA Ao Root diam: 3.40 cm MITRAL VALVE               TRICUSPID VALVE MV Area (PHT): 5.31 cm    TR Peak grad:   37.9 mmHg MV Decel Time: 143 msec    TR Vmax:        308.00 cm/s MV E velocity: 78.80 cm/s MV A velocity: 24.90 cm/s  SHUNTS MV E/A ratio:  3.16        Systemic VTI:  0.09 m                            Systemic Diam: 2.30 cm Toribio Fuel MD Electronically signed by Toribio Fuel MD Signature Date/Time: 04/06/2024/6:00:21 PM    Final    HYBRID OR IMAGING ([facility] ONLY) Result Date: 04/06/2024 There is no interpretation for this exam.  This order is for images obtained during a surgical procedure.  Please See Surgeries Tab for more information regarding the procedure.   CT Angio Abd/Pel w/ and/or w/o Result Date: 04/06/2024 CLINICAL DATA:  Abdominal pain and diarrhea with clinical and imaging findings suspicious for acute mesenteric ischemia suggested on an abdomen and pelvis CT dated 04/03/2024. Status post laparoscopy and laparotomy with bowel resection, appendectomy and application of a wound VAC on 04/05/2024. EXAM: CTA ABDOMEN AND PELVIS WITHOUT AND WITH CONTRAST TECHNIQUE: Multidetector CT imaging of the abdomen and pelvis was performed using the standard protocol during bolus administration of intravenous contrast. Multiplanar reconstructed images and MIPs were obtained and reviewed to evaluate the vascular anatomy. RADIATION DOSE REDUCTION: This exam was performed according to the departmental dose-optimization program which includes automated exposure control, adjustment of the mA and/or kV according to patient size and/or use of iterative reconstruction technique. CONTRAST:   OMNIPAQUE  IOHEXOL  350 MG/ML SOLN COMPARISON:  Abdomen and pelvis CT dated 04/03/2024. FINDINGS: VASCULAR Aorta: Extensive atheromatous calcifications without aneurysm or dissection. No significant luminal stenosis. Celiac: Dense atheromatous calcifications at the origin without significant stenosis. SMA: Marked calcified plaque at the origin with near occluding thrombus/noncalcified plaque in the proximal SMA. Additional thrombus in an estimated branch level extending into the right mid abdomen causing significant narrowing in the proximal aspect of this branch vessel and complete occlusion of the mid and distal portions. Renals: Dense calcifications at the origins of both renal arteries and in both proximal renal arteries with proximally 50% stenosis in the proximal left renal artery and approximately 90% stenosis in the proximal right renal artery. IMA: Calcified plaque in the proximal IMA causing approximately 30% stenosis. Inflow: Extensive calcified and some noncalcified plaque bilaterally with no areas of near occlusive stenosis. Proximal Outflow: Extensive calcified plaques bilaterally without near occlusive stenosis. Veins: No obvious venous abnormality within the limitations of this arterial phase study. Review of the MIP images confirms the above findings. NON-VASCULAR Lower chest: Enlarged heart. Small to moderate-sized right pleural effusion and small left pleural effusion. Mild associated bibasilar atelectasis. Hepatobiliary: Sludge and 3 mm calculus in the gallbladder without wall thickening. Small amount of pericholecystic fluid commensurate with a small amount of ascites in the  abdomen. Pancreas: Unremarkable. No pancreatic ductal dilatation or surrounding inflammatory changes. Spleen: Normal in size without focal abnormality. Adrenals/Urinary Tract: 4.0 cm and 2.7 cm fat density masses in the left adrenal gland. Right adrenal hyperplasia as well as a 1.6 cm possible adenoma. Bilateral simple  appearing renal cysts, not needing imaging follow-up. Unremarkable ureters. Foley catheter in the urinary bladder with no urine in the bladder. Stomach/Bowel: Interval small bowel resection with a right lower quadrant anastomosis as well as midline and the left of midline at anastomosis in the lower abdomen. Mildly dilated loops of jejunum in the left mid abdomen without wall thickening or pneumatosis. No bowel wall thickening or pneumatosis elsewhere in the abdomen. Multiple sigmoid colon diverticula without evidence of diverticulitis and proximal transverse colon diverticulum without evidence of diverticulitis. Surgically absent appendix. Normal-appearing stomach with a nasogastric tube in place. Lymphatic: No enlarged lymph nodes. Reproductive: Mildly to moderately enlarged prostate gland. Other: Multiple areas of postoperative free peritoneal fluid and air. Small umbilical hernia containing fat. Musculoskeletal: Marked lumbar and lower thoracic spine degenerative changes. IMPRESSION: 1. Near occluding thrombus/noncalcified plaque in the proximal SMA with additional thrombus in an SMA branch level extending into the right mid abdomen causing significant narrowing in the proximal aspect of this branch vessel and complete occlusion of the mid and distal portions. 2. Mildly dilated loops of jejunum in the left mid abdomen without wall thickening or pneumatosis. This is most likely due to ileus. 3. Interval small bowel resection with a right lower quadrant anastomosis as well as midline and left of midline anastomoses in the lower abdomen. 4. Multiple areas of postoperative free peritoneal fluid and air. 5. Small to moderate-sized right pleural effusion and small left pleural effusion with mild associated bibasilar atelectasis. 6. Extensive calcified plaques involving the aorta and its branches, as described above. 7. Cholelithiasis and sludge in the gallbladder without evidence of cholecystitis. 8. 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 9. 1.6 cm probable right adrenal adenoma. 10. Colonic diverticulosis without evidence of diverticulitis. 11. Mildly to moderately enlarged prostate gland. 12. Small umbilical hernia containing fat. Critical Value/emergent results were called by telephone at the time of interpretation on 04/06/2024 at 11:29 am to provider Vina Pesa, NP, who verbally acknowledged these results. Aortic Atherosclerosis (ICD10-I70.0). Electronically Signed   By: Elspeth Bathe M.D.   On: 04/06/2024 11:39   CT CHEST WO CONTRAST Result Date: 04/06/2024 CLINICAL DATA:  Right middle lobe pneumonia EXAM: CT CHEST WITHOUT CONTRAST TECHNIQUE: Multidetector CT imaging of the chest was performed following the standard protocol without IV contrast. RADIATION DOSE REDUCTION: This exam was performed according to the departmental dose-optimization program which includes automated exposure control, adjustment of the mA and/or kV according to patient size and/or use of iterative reconstruction technique. COMPARISON:  Multiple recent radiographs. FINDINGS: Cardiovascular: The study was performed without contrast. Heart is mildly enlarged. There is extensive coronary artery calcification. Previous CABG. Extensive aortic atherosclerotic calcification. Mediastinum/Nodes: Right paratracheal and Peri carinal lymphadenopathy consistent with metastatic disease. Right-sided Peri carinal lymph node measures 3.7 x 2.9 cm. Lungs/Pleura: Left chest shows a small effusion layering dependently with mild dependent atelectasis and mild emphysematous change in the upper lung. Right chest shows a moderate effusion layering dependently with dependent atelectasis. Pulmonary infiltrate is present within the right upper lobe consistent with bronchopneumonia. There is a right upper lobe mass measuring approximately 5.7 x 4.1 x 6.0 cm consistent with primary lung carcinoma. This is metastatic to the hilar, Peri carinal and  right paratracheal  nodes as noted above. Upper Abdomen: No acute upper abdominal finding. Fat-density adrenal masses consistent with adenomas. Musculoskeletal: Ordinary thoracic degenerative changes. IMPRESSION: 1. 5.7 x 4.1 x 6.0 cm right upper lobe mass consistent with primary lung carcinoma. This is metastatic to the right hilar, pericarinal and right paratracheal nodes. 2. Right upper lobe pulmonary infiltrate consistent with bronchopneumonia. 3. Moderate right effusion layering dependently with dependent atelectasis. Small left effusion layering dependently with mild dependent atelectasis. 4. Aortic atherosclerosis. Coronary artery calcification. Previous CABG. 5. Fat-density adrenal masses consistent with adenomas. Aortic Atherosclerosis (ICD10-I70.0). Electronically Signed   By: Oneil Officer M.D.   On: 04/06/2024 11:05   DG CHEST PORT 1 VIEW Result Date: 04/06/2024 CLINICAL DATA:  Follow-up ventilator support EXAM: PORTABLE CHEST 1 VIEW COMPARISON:  04/05/2024 FINDINGS: Endotracheal tube tip 4 cm above the carina. Orogastric or nasogastric tube enters the abdomen. Right arm PICC remains in place with its tip at the SVC RA junction. Slight worsening of the pattern of pulmonary opacity worse on the right than the left. Findings could relate to infectious pneumonia. Focal density in the right mid lung could possibly represent a mass. Question small pleural effusion on the right. IMPRESSION: Slight worsening of the pattern of pulmonary opacity worse on the right than the left. Findings could relate to infectious pneumonia. Focal density in the right mid lung could possibly represent a mass. Question small right effusion. Electronically Signed   By: Oneil Officer M.D.   On: 04/06/2024 07:55   DG Chest Port 1 View Result Date: 04/05/2024 CLINICAL DATA:  PICC line placement EXAM: PORTABLE CHEST 1 VIEW COMPARISON:  None Available. FINDINGS: Endotracheal tube and NG tube remain in place, unchanged. Right PICC line in place with the  tip at the cavoatrial junction. Prior median sternotomy. Heart mediastinal contours within normal limits. Consolidation in the right upper lobe, unchanged. Mild vascular congestion. No visible effusions or acute bony abnormality. IMPRESSION: Right PICC line tip at the cavoatrial junction. Stable right upper lobe consolidation. Mild vascular congestion. Electronically Signed   By: Franky Crease M.D.   On: 04/05/2024 19:14   DG CHEST PORT 1 VIEW Result Date: 04/05/2024 CLINICAL DATA:  On mechanically assisted ventilation EXAM: PORTABLE CHEST 1 VIEW COMPARISON:  None Available. FINDINGS: There is increased opacity of right mid-upper hemithorax likely representing an airspace consolidation/atelectasis. There is diffuse interstitial marking of central lungs. No significant pleural effusion. Cardiomediastinal silhouette is mildly enlarged. Status post median sternotomy. Enteric tube terminates in gastric fundus. Endotracheal tube terminates the 1.9 cm above carina. Degenerative changes of bilateral shoulder joints. IMPRESSION: Mid to upper right hemithorax opacity likely representing consolidation/atelectasis. Electronically Signed   By: Megan  Zare M.D.   On: 04/05/2024 17:50    Anti-infectives: Anti-infectives (From admission, onward)    Start     Dose/Rate Route Frequency Ordered Stop   04/04/24 2200  ceFEPIme  (MAXIPIME ) 2 g in sodium chloride  0.9 % 100 mL IVPB        2 g 200 mL/hr over 30 Minutes Intravenous Every 12 hours 04/04/24 1332     04/04/24 1300  metroNIDAZOLE  (FLAGYL ) IVPB 500 mg        500 mg 100 mL/hr over 60 Minutes Intravenous Every 12 hours 04/04/24 0825     04/04/24 0900  ceFEPIme  (MAXIPIME ) 2 g in sodium chloride  0.9 % 100 mL IVPB  Status:  Discontinued        2 g 200 mL/hr over 30 Minutes Intravenous Every 8 hours  04/04/24 0842 04/04/24 1332   04/04/24 0030  ceFEPIme  (MAXIPIME ) 2 g in sodium chloride  0.9 % 100 mL IVPB        2 g 200 mL/hr over 30 Minutes Intravenous  Once 04/04/24  0025 04/04/24 0107   04/04/24 0030  metroNIDAZOLE  (FLAGYL ) IVPB 500 mg        500 mg 100 mL/hr over 60 Minutes Intravenous  Once 04/04/24 0025 04/04/24 0137        Assessment/Plan POD 2, s/p ex lap with SBRx2 in discontinuity, open abdomen, Dr. Tanda 8/20 for ischemic bowel POD 1 endovascular thrombectomy of SMA w/ stenting (Dr. Magda) - Plan for RTOR today. I had a long discussion with the patient's grand daughter, who is an ID physician, as well as his daughter Margit. I discussed that it appears his mesenteric flow has been optimized and that the plan will be dependent on intra-operative findings. It is possible that I may need to resect more bowel or that I may find evidence of global ischemic changes, in which case I would not plan to create an anastomosis or close his abdomen.  All of the family's questions were addressed and verbal consent was obtained and witnessed by 2 nurses.  - Appreciate CCM assistance     FEN: NPO,OGT (famotidine  for some blood-tinged output), PICC/TNA VTE: Eliquis  on hold, heparin  gtt ID: cefepime /flagyl    - per [facility] -  A. Fib on Eliquis  - LD 8/18 PM CAD s/p CABG HTN HLD CHF - last ECHO in 2021 with EF 65% Hx of perforated appendicitis treated with abx    LOS: 3 days   I reviewed hospitalist notes, last 24 h vitals and pain scores, last 48 h intake and output, last 24 h labs and trends, and last 24 h imaging results.  This care required high  level of medical decision making.    Cordella DELENA Idler, MD  [facility] Surgery 04/07/2024, 9:52 AM Please see Amion for pager number during day hours 7:00am-4:30pm    Cordella DELENA Idler, [facility] Surgery 04/07/2024, 9:52 AM Please see Amion for pager number during day hours 7:00am-4:30pm

## 2024-04-07 NOTE — Progress Notes (Signed)
 eLink Physician-Brief Progress Note Patient Name: Donald [name] DOB: [birth year] MRN: [id number]   Date of Service  04/07/2024  HPI/Events of Note  Oliguria.  eICU Interventions  LR ordered at 75 ml / hour x 12 hours.        Duilio Heritage U Armin Yerger 04/07/2024, 2:38 AM

## 2024-04-07 NOTE — Anesthesia Preprocedure Evaluation (Signed)
 Anesthesia Evaluation  Patient identified by MRN, date of birth, ID band Patient awake    Reviewed: Allergy & Precautions, H&P , NPO status , Patient's Chart, lab work & pertinent test results  History of Anesthesia Complications Negative for: history of anesthetic complications  Airway Mallampati: II  TM Distance: >3 FB Neck ROM: Full    Dental no notable dental hx.    Pulmonary former smoker   Pulmonary exam normal breath sounds clear to auscultation       Cardiovascular hypertension, Pt. on medications and Pt. on home beta blockers + CAD, + CABG, + Peripheral Vascular Disease and +CHF  Normal cardiovascular exam+ dysrhythmias Atrial Fibrillation  Rhythm:Regular Rate:Normal  '21 ECHO:   Left Ventricle Normal left ventricular systolic function. No wall motion abnormalities in all visualized segments.   Function: Unable to assess diastolic function in this patient.     Neuro/Psych negative neurological ROS  negative psych ROS   GI/Hepatic Neg liver ROS,,,Ischemic enteritis   Endo/Other  negative endocrine ROS    Renal/GU negative Renal ROS  negative genitourinary   Musculoskeletal negative musculoskeletal ROS (+)    Abdominal   Peds negative pediatric ROS (+)  Hematology  (+) Blood dyscrasia (Hb 9.4, plt 196k) eliquis    Anesthesia Other Findings Sepsis 2/2 enterocolitis / possible ischemic bowel   Reproductive/Obstetrics negative OB ROS                              Anesthesia Physical Anesthesia Plan  ASA: 4  Anesthesia Plan: General   Post-op Pain Management:    Induction: Intravenous and Inhalational  PONV Risk Score and Plan: 2 and Treatment may vary due to age or medical condition  Airway Management Planned: Oral ETT  Additional Equipment: Arterial line  Intra-op Plan:   Post-operative Plan: Post-operative intubation/ventilation  Informed Consent: I have reviewed  the patients History and Physical, chart, labs and discussed the procedure including the risks, benefits and alternatives for the proposed anesthesia with the patient or authorized representative who has indicated his/her understanding and acceptance.     Dental advisory given  Plan Discussed with: CRNA  Anesthesia Plan Comments:          Anesthesia Quick Evaluation

## 2024-04-07 NOTE — Progress Notes (Addendum)
 PHARMACY - ANTICOAGULATION CONSULT NOTE  Pharmacy Consult for Heparin  Indication: atrial fibrillation (apixaban  on hold), ischemic bowel  Allergies  Allergen Reactions   Bee Venom Other (See Comments)    Other reaction(s): fainting, decreased BP   Chromium Other (See Comments)    rash   Gabapentin  Other (See Comments)    Can't take capsules, tablets are tolerated 7/18 pt states allergy is resolved now, can take capsules    Sulfa Antibiotics Rash    Patient Measurements: Height: 5' 6.5 (168.9 cm) Weight: 77.1 kg (169 lb 15.6 oz) IBW/kg (Calculated) : 64.95 HEPARIN  DW (KG): 75.2  Vital Signs: Temp: 97.9 F (36.6 C) (08/22 1607) Temp Source: Oral (08/22 1607) Pulse Rate: 104 (08/22 1607)  Labs: Recent Labs    04/05/24 1710 04/06/24 0420 04/06/24 1456 04/06/24 1610 04/06/24 1728 04/07/24 0238 04/07/24 1000  HGB 10.1* 9.4*   < > 9.2* 10.2* 9.6*  --   HCT 33.1* 28.5*   < > 27.0* 30.0* 29.2*  --   PLT 198 196  --   --   --  270  --   APTT  --  101*  --   --   --   --  59*  HEPARINUNFRC  --  0.73*  --   --   --   --  0.30  CREATININE 0.55* 0.50*  --   --   --  0.47*  --    < > = values in this interval not displayed.    Estimated Creatinine Clearance: 57.6 mL/min (A) (by C-G formula based on SCr of 0.47 mg/dL (L)).  Assessment: 89 yoM presented to ED on 8/19 with abdominal pain, found to have ischemic small bowel s/p exploratory/diagnostic laparotmy with appendectomy and resection x2 of small bowel on 8/20.  Pharmacy is consulted to dose Heparin  IV for ischemic bowel and history of Afib on apixaban  prior to admission.  Lase dose given on 8/18 at 17:30. Enoxaparin  prophylaxis started on admission and given on 8/19 at 10:15.  S/p OR for mechanical thrombectomy of superior mesenteric artery 8/21. Heparin  level came back therapeutic at 0.3, aPTT was slightly low at 59, on heparin  infusion at 1000 units/hr. No infusion issues.  Pt back to OR this afternoon for re-opening  laparotomy and SB resection. Heparin  was continued throughout the procedure.  Goal of Therapy:  Heparin  level 0.3-0.7 units/ml aPTT 66-102 seconds Monitor platelets by anticoagulation protocol: Yes   Plan:  Will recheck aPTT and heparin  level now with pt out of surgery  Thank you for allowing pharmacy to participate in this patient's care,  Vito Ralph, PharmD, BCPS Please see amion for complete clinical pharmacist phone list 04/07/2024 5:19 PM  ADDENDUM (2000) Heparin  level 0.35, aPTT 71 sec. Both therapeutic and appear to be correlating at this point so will utilize heparin  monitoring from here on.  Plan: Will continue heparin  1000 units/hr F/u a.m. heparin  level  Vito Ralph, PharmD, BCPS Please see amion for complete clinical pharmacist phone list 04/07/2024 7:56 PM

## 2024-04-07 NOTE — Op Note (Signed)
 Preoperative diagnosis: Mesenteric ischemia  Postoperative diagnosis: Same  Procedure: Re-opening laparotomy Small bowel resection Abdominal wall closure  Surgeon: Cordella Idler, M.D.  Asst: Camellia Blush, MD. A surgeon assistant was essential to the completion of this case given the critical nature of the patient and the complexity of the operation. The assistant helped with retraction, dissection, and intra-op decision sharing.   Anesthesia: GETA  Indications for procedure: Donald [name] is a 89 y.o. year old male who initially presented to [facility] with abdominal pain.  He underwent imaging that was concerning for enteritis.  He was evaluated by Dr. Blush with concern for an acute abdomen.  He was taken urgently to the operating room where portions of the small bowel had to be resected due to ischemia.  His abdomen was left open.  Given the concern for mesenteric ischemia he underwent a CTA postop which identified a thrombus within his SMA.  He was transferred to [facility] for ongoing management and assistance with vascular surgery.  He was taken to the endovascular suite for thrombectomy of his SMA.  Following this we discussed with family the option to return to the operating room to evaluate his bowel and to potentially close his abdomen.  All of their questions were addressed and written consent was obtained.  Description of procedure: Patient was brought from the ICU directly to the operating room.  He was already intubated and sedated.  Anesthesia was present to monitor the patient and to control the airway.  His abdomen was prepped with Betadine.  We began by removing the temporary abdominal closure device.  There was minimal serous ascites within the abdomen.  There was no succus or evidence of perforation.  We ran the small bowel and identified the staple lines from the prior surgery.  The small bowel between the 2 staple lines was clearly ischemic and the decision was made to resect  this portion of the small bowel.  The remaining small bowel appeared viable and we were able to palpate a bounding SMA pulse.  In total he had approximately 110 cm of small bowel left.  At this point we decided to perform an anastomosis and close the abdomen.  A stapled side-to-side functional end-to-end small bowel anastomosis was created in standard fashion using a blue load GIA stapler to create a common channel and a TX 60 stapler to close the common channel.  The mesenteric defect was not closed. The abdomen was irrigated with sterile saline and the effluent ran clear. The fascia was closed with running #1 PDS suture. The skin was left open.   Findings: The small bowel between the two previous resections was ischemic and had to be resected. He had a palpable SMA pulse. He has approximately 110cm of small bowel left.   Specimen: Small bowel resection  Implant: None   Blood loss: Minimal  Complications: None  CASE DATA: Type of patient?: DOW CASE (Surgical Hospitalist [facility]) Status of Case? URGENT Add On Infection Present At Time Of Surgery (PATOS)?  INFECTION   Cordella Idler, M.D. General Surgery [facility], [place]

## 2024-04-07 NOTE — Progress Notes (Addendum)
 PHARMACY - TOTAL PARENTERAL NUTRITION CONSULT NOTE   Indication: ischemic bowel   Patient Measurements: Height: 5' 6.5 (168.9 cm) Weight: 77.1 kg (169 lb 15.6 oz) IBW/kg (Calculated) : 64.95 TPN AdjBW (KG): 75.2 Body mass index is 27.02 kg/m. Usual Weight:   Assessment: Patient is an 89 y.o M with hx afib who presented to the ED on 04/03/24 with c/o diarrhea and abdominal pain. Abd/pelvis CT on 04/03/24 showed findings with concern for ischemic enteritis and colitis. With significant TTP noted on exam on 04/05/24, plan is to proceed with exp lap with possible laparotomy and bowel resection.  Pharmacy has been consulted on 04/05/24 to dose TPN for poor nutritional status.  Glucose / Insulin : no hx DM; mSSI q4h (19u), CBGs <180 Electrolytes: Na 134 >136, K 4.0 (post K) > 4.2, Mag 1.9 >2.2 (post 2g), Phos 2.1 > 2.6 (post phos), CoCa 9.34 WNL Renal: SCr < 1, BUN WNL  Hepatic: AST/ALT, Alk phos WNL.Tbili elevated/increased to 2.2, Albumin  low at 2.7, 8/21 Triglycerides 117 on propofol   Intake / Output; MIVF: UOP 0.40ml/kg/min, NG 0cc, +9L since admission, LR @75 /hr in addition to TPN GI Imaging: - 8/18 CT: suspicious for ischemic enteritis. Mild wall thickening and mucosal enhancement involving the hepatic flexure and proximal transverse colon, suspect for colitis of infectious, inflammatory or ischemic etiology. GI Surgeries / Procedures:  - 8/20 exploratory/diagnositic laparotmy with appendectomy and resection x2 of ischemic small bowel -8/21 mesenteric artery thrombectomy, angio, and stent   Central access: PICC 8/20  TPN start date: 04/05/24   Nutritional Goals: Goal TPN rate without lipids is 75 mL/hr (provides 85 g of protein and 1317 kcals per day)  RD Assessment: Estimated Needs Total Energy Estimated Needs: 1650-1850 Total Protein Estimated Needs: 75-95g Total Fluid Estimated Needs: 1.8L/day  Current Nutrition:  NPO + Propofol   50 mcg/kg/min, 587 KCal/day   Plan:   Increase TPN to 70 mL/hr without lipids at 1800 which provides 100% of estimated daily caloric needs 1816kcal (including propofol  calories) Electrolytes in TPN: Na 85 mEq/L,K to 35 mEq/L, Ca to 3 mEq/L, Mg 80mEq/L, and increase Phos 15mmol/L. Cl:Ac 1:2 adjusted for rate Add standard MVI and trace elements in TPN  Increase to resistant SSI q4h SSI and adjust as needed  Thiamine  x 5 days IV  Monitor TPN labs on Mon/Thurs Follow up transition from propofol  to Precedex after OR today if possible.   Vermell Mccallum, PharmD 04/07/2024 11:10 AM

## 2024-04-07 NOTE — Transfer of Care (Signed)
 Immediate Anesthesia Transfer of Care Note  Patient: Donald [name]  Procedure(s) Performed: LAPAROTOMY, EXPLORATORY  Patient Location: SICU  Anesthesia Type:General  Level of Consciousness: Patient remains intubated per anesthesia plan  Airway & Oxygen Therapy: Patient remains intubated per anesthesia plan  Post-op Assessment: Report given to RN and Post -op Vital signs reviewed and stable  Post vital signs: Reviewed and stable  Last Vitals:  Vitals Value Taken Time  BP 114/65   Temp 36.6 C 04/07/24 16:07  Pulse 104 04/07/24 16:14  Resp 18 04/07/24 16:14  SpO2 100 % 04/07/24 16:14  Vitals shown include unfiled device data.  Last Pain:  Vitals:   04/07/24 1607  TempSrc: Oral  PainSc:       Patients Stated Pain Goal: 1 (04/04/24 1213)  Complications: No notable events documented.

## 2024-04-08 DIAGNOSIS — Z95828 Presence of other vascular implants and grafts: Secondary | ICD-10-CM

## 2024-04-08 DIAGNOSIS — K559 Vascular disorder of intestine, unspecified: Secondary | ICD-10-CM | POA: Diagnosis not present

## 2024-04-08 DIAGNOSIS — I748 Embolism and thrombosis of other arteries: Secondary | ICD-10-CM

## 2024-04-08 DIAGNOSIS — E43 Unspecified severe protein-calorie malnutrition: Secondary | ICD-10-CM | POA: Insufficient documentation

## 2024-04-08 DIAGNOSIS — Z9889 Other specified postprocedural states: Secondary | ICD-10-CM

## 2024-04-08 DIAGNOSIS — A419 Sepsis, unspecified organism: Secondary | ICD-10-CM | POA: Diagnosis not present

## 2024-04-08 DIAGNOSIS — J90 Pleural effusion, not elsewhere classified: Secondary | ICD-10-CM | POA: Diagnosis not present

## 2024-04-08 DIAGNOSIS — I4891 Unspecified atrial fibrillation: Secondary | ICD-10-CM

## 2024-04-08 LAB — BASIC METABOLIC PANEL WITH GFR
Anion gap: 1 — ABNORMAL LOW (ref 5–15)
BUN: 20 mg/dL (ref 8–23)
CO2: 22 mmol/L (ref 22–32)
Calcium: 8.3 mg/dL — ABNORMAL LOW (ref 8.9–10.3)
Chloride: 111 mmol/L (ref 98–111)
Creatinine, Ser: 0.44 mg/dL — ABNORMAL LOW (ref 0.61–1.24)
GFR, Estimated: >60 mL/min (ref >60)
Glucose, Bld: 178 mg/dL — ABNORMAL HIGH (ref 70–99)
Potassium: 4.4 mmol/L (ref 3.5–5.1)
Sodium: 134 mmol/L — ABNORMAL LOW (ref 135–145)

## 2024-04-08 LAB — GLUCOSE, CAPILLARY
Glucose-Capillary: 163 mg/dL — ABNORMAL HIGH (ref 70–99)
Glucose-Capillary: 171 mg/dL — ABNORMAL HIGH (ref 70–99)
Glucose-Capillary: 178 mg/dL — ABNORMAL HIGH (ref 70–99)
Glucose-Capillary: 183 mg/dL — ABNORMAL HIGH (ref 70–99)
Glucose-Capillary: 185 mg/dL — ABNORMAL HIGH (ref 70–99)
Glucose-Capillary: 187 mg/dL — ABNORMAL HIGH (ref 70–99)
Glucose-Capillary: 193 mg/dL — ABNORMAL HIGH (ref 70–99)

## 2024-04-08 LAB — HEPARIN LEVEL (UNFRACTIONATED)
Heparin Unfractionated: 0.26 [IU]/mL — ABNORMAL LOW (ref 0.30–0.70)
Heparin Unfractionated: 0.24 [IU]/mL — ABNORMAL LOW (ref 0.30–0.70)

## 2024-04-08 LAB — CBC
HCT: 29.6 % — ABNORMAL LOW (ref 39.0–52.0)
Hemoglobin: 9.8 g/dL — ABNORMAL LOW (ref 13.0–17.0)
MCH: 32.6 pg (ref 26.0–34.0)
MCHC: 33.1 g/dL (ref 30.0–36.0)
MCV: 98.3 fL (ref 80.0–100.0)
Platelets: 281 K/uL (ref 150–400)
RBC: 3.01 MIL/uL — ABNORMAL LOW (ref 4.22–5.81)
RDW: 12.4 % (ref 11.5–15.5)
WBC: 19.6 K/uL — ABNORMAL HIGH (ref 4.0–10.5)
nRBC: 0 % (ref 0.0–0.2)

## 2024-04-08 LAB — PHOSPHORUS: Phosphorus: 2.5 mg/dL (ref 2.5–4.6)

## 2024-04-08 LAB — MAGNESIUM: Magnesium: 2.1 mg/dL (ref 1.7–2.4)

## 2024-04-08 MED ORDER — TRACE MINERALS CU-MN-SE-ZN 300-55-60-3000 MCG/ML IV SOLN
INTRAVENOUS | Status: DC
  Filled 2024-04-08: qty 744

## 2024-04-08 MED ORDER — LABETALOL HCL 5 MG/ML IV SOLN
10.0000 mg | INTRAVENOUS | Status: DC | PRN
  Administered 2024-04-08 – 2024-04-09 (×2): 10 mg via INTRAVENOUS
  Filled 2024-04-08 (×2): qty 4

## 2024-04-08 MED ORDER — AMIODARONE HCL IN DEXTROSE 360-4.14 MG/200ML-% IV SOLN
60.0000 mg/h | INTRAVENOUS | Status: AC
  Administered 2024-04-08: 60 mg/h via INTRAVENOUS
  Filled 2024-04-08 (×2): qty 200

## 2024-04-08 MED ORDER — AMIODARONE HCL IN DEXTROSE 360-4.14 MG/200ML-% IV SOLN
30.0000 mg/h | INTRAVENOUS | Status: DC
  Administered 2024-04-08 – 2024-04-09 (×3): 30 mg/h via INTRAVENOUS
  Filled 2024-04-08 (×2): qty 200

## 2024-04-08 NOTE — Progress Notes (Signed)
 NAME:  Donald [name], MRN:  [id number], DOB:  03-02-1933, LOS: 4 ADMISSION DATE:  04/03/2024, CONSULTATION DATE:  04/05/2024 REFERRING MD:  Dr. Camellia Blush, CHIEF COMPLAINT:  ischemic small bowel   History of Present Illness:  Donald [name] is a 89 year old male with PMH significant for Afib on Eliquis , CAD s/p CABGx3 (1998), HTN and HLD who presented on 04/03/2024 with 1 week of diarrhea and right lower quadrant abdominal pain. 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 Patient was admitted for further management and general surgery was consulted. Initially patient was managed conservatively on the floor but giving ongoing abdominal pain, worsening leukocytosis and persistent tachycardia the decision was made to proceed with diagnostic ex-lap on 8/20. Ex-lap showed small bowel ischemia and he underwent small bowel resection x 2 and appendectomy. He had other areas suspicious for ischemia so he was left open in discontinuity with plan to return for a second look on Friday 8/22. He was brought to ICU for ventilator management.   Pertinent  Medical History  Afib on Eliquis   CAD s/p CABGx3 (1998)  HTN HLD  Significant Hospital Events: Including procedures, antibiotic start and stop dates in addition to other pertinent events   04/03/2024: admitted for ischemic versus infectious enteritis 04/05/2024: OR for diagnostic ex-lap with ischemic small bowel s/p small bowel resection x 2, other concerning areas for ischemia, left open and in discontinuity. Transferred to ICU on ventilator. 8/21 OR w [facility] for SMA thrombectomy and angioplasty 8/22 return OR w [facility] underwent small bowel resection and abdominal wall closure  Interim History / Subjective:  No overnight issues Came off of vasopressor support Sedation has been turned off Slowly waking up, remained  afebrile White count is trending down  Objective    Blood pressure 131/60, pulse 98, temperature 98.5 F (36.9 C), temperature source Oral, resp. rate 20, height 5' 6.5 (1.689 m), weight 79.8 kg, SpO2 100%.    Vent Mode: PRVC FiO2 (%):  [40 %] 40 % Set Rate:  [18 bmp] 18 bmp Vt Set:  [510 mL] 510 mL PEEP:  [5 cmH20] 5 cmH20 Plateau Pressure:  [17 cmH20-18 cmH20] 17 cmH20   Intake/Output Summary (Last 24 hours) at 04/08/2024 0827 Last data filed at 04/08/2024 0600 Gross per 24 hour  Intake 3056.3 ml  Output 780 ml  Net 2276.3 ml   Filed Weights   04/05/24 1500 04/06/24 0433 04/08/24 0615  Weight: 75.2 kg 77.1 kg 79.8 kg   Examination: General: Crtitically ill-appearing elderly male, orally intubated HEENT: [place]/AT, eyes anicteric.  ETT and OGT in place Neuro: Opens eyes with vocal stimuli, not following commands, does not track examiner Chest: Reduced air entry at the bases bilaterally, no wheezes or rhonchi Heart: Irregularly irregular, no murmurs or gallops Abdomen: Soft, nondistended, surgical incision is well-dressed, no discharge noted, sluggish bowel sounds present  Labs and images reviewed  Patient Lines/Drains/Airways Status     Active Line/Drains/Airways     Name Placement date Placement time Site Days   Arterial Line 04/06/24 Right Radial 04/06/24  1617  Radial  2   Peripheral IV 04/05/24 18 G Anterior;Left;Proximal;Upper Arm 04/05/24  1200  Arm  3   PICC Triple Lumen 04/05/24 Right Brachial 39 cm 0 cm 04/05/24  1824  -- 3   NG/OG Vented/Dual Lumen 18 Fr. Oral 04/05/24  1353  Oral  3  Urethral Catheter CHRISTELLA Mace RN Double-lumen;Latex 16 Fr. 04/05/24  1253  Double-lumen;Latex  3   Airway 7.5 mm 04/05/24  1247  -- 3   Wound 04/05/24 1410 Surgical Laparoscopic Abdomen Left Left;Upper Left;Mid Left;Lower 04/05/24  1410  Abdomen  3   Wound 04/05/24 1458 Surgical Closed Surgical Incision Abdomen Other (Comment) 04/05/24  1458  Abdomen  3   Wound 04/06/24 1722  Surgical Closed Surgical Incision Groin Right 04/06/24  1722  Groin  2        Resolved problem list   Assessment and Plan  Bowel ischemia s/p ex lap small bowel resection x3 + appendectomy  Sepsis due to acute peritonitis Sedation related hypotension Appreciate general surgery follow-up Patient went back to the OR yesterday, underwent portion of ischemic small bowel resection, abdomen was closed, skin remained open Sedation is off, vasopressors were titrated off Continue cefepime  and metronidazole  Continue OGT on low intermittent wall suction  SMA occlusion s/p thrombectomy, angioplasty and stent placement  Appreciate vascular surgery follow-up Continue aspirin  and heparin  infusion  Acute respiratory insufficiency, postprocedure Bilateral R>L pleural effusion  R lung mass, 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 Patient is tolerating spontaneous breathing trial, mental status precludes extubation for now Sedation has been off, will await until he is more interactive before extubation Holding off on thoracentesis Outpatient follow-up with pulmonary/oncology  Chronic A-fib with controlled rate Patient is on Toprol  at home, currently on hold Heart rate is well-controlled Continue IV heparin  infusion for stroke prophylaxis  Hematuria, likely due to traumatic Foley Hematuria is improving H&H is stable  Anemia of critical illness H&H is stable between 9-10 Monitor H&H and transfuse if less than 7  Severe protein calorie malnutrition Continue TPN until bowel function returns  GOC - 8/22 spoke w pt granddaughter who is an ID physician at [employer]. Sounds like family is very realistic -- critical illness at 89 yo w major surgeries is a difficult journey but hope is to continue aggressive measures and get through this. Sounds like they understand the possibility of further decompensation & appreciate very transparent communication.  -regarding his incidental mass / cf cancer-- hopefully if  pt gets through this critical course he will be able to engage in decision making for this, but sounds like QOL is a high priority    Labs   CBC: Recent Labs  Lab 04/05/24 1710 04/06/24 0420 04/06/24 1456 04/06/24 1728 04/07/24 0238 04/07/24 2101 04/07/24 2109 04/08/24 0507  WBC 13.3* 17.9*  --   --  27.3* 17.6*  --  19.6*  HGB 10.1* 9.4*   < > 10.2* 9.6* 9.1* 8.5* 9.8*  HCT 33.1* 28.5*   < > 30.0* 29.2* 27.8* 25.0* 29.6*  MCV 102.2* 98.3  --   --  98.3 98.6  --  98.3  PLT 198 196  --   --  270 245  --  281   < > = values in this interval not displayed.    Basic Metabolic Panel: Recent Labs  Lab 04/05/24 1012 04/05/24 1157 04/05/24 1710 04/06/24 0420 04/06/24 1610 04/06/24 1728 04/07/24 0238 04/07/24 2101 04/07/24 2109 04/08/24 0507  NA  --    < > 134* 131*   < > 134* 136 134* 136 134*  K  --    < > 4.2 3.4*   < > 4.0 4.2 4.2 4.3 4.4  CL  --    < > 103 101  --   --  104 105  --  111  CO2  --   --  19* 22  --   --  21* 21*  --  22  GLUCOSE  --    < > 123* 227*  --   --  179* 184*  --  178*  BUN  --    < > 16 15  --   --  13 16  --  20  CREATININE  --    < > 0.55* 0.50*  --   --  0.47* 0.46*  --  0.44*  CALCIUM   --   --  8.6* 8.3*  --   --  8.3* 8.3*  --  8.3*  MG 1.6*  --   --  1.9  --   --  2.2 2.0  --  2.1  PHOS 2.1*  --   --  2.1*  --   --  2.6 2.6  --  2.5   < > = values in this interval not displayed.   GFR: Estimated Creatinine Clearance: 62.8 mL/min (A) (by C-G formula based on SCr of 0.44 mg/dL (L)). Recent Labs  Lab 04/05/24 2128 04/06/24 0420 04/07/24 0238 04/07/24 2101 04/07/24 2109 04/08/24 0507  WBC  --  17.9* 27.3* 17.6*  --  19.6*  LATICACIDVEN 2.0* 1.4 1.3  --  0.9  --     Liver Function Tests: Recent Labs  Lab 04/03/24 2011 04/05/24 1710  AST 16 25  ALT 17 18  ALKPHOS 52 45  BILITOT 1.4* 2.2*  PROT 7.0 5.4*  ALBUMIN  3.2* 2.7*   Recent Labs  Lab 04/03/24 2011  LIPASE 29   No results for input(s): AMMONIA in the last 168  hours.  ABG    Component Value Date/Time   PHART 7.384 04/07/2024 2109   PCO2ART 37.1 04/07/2024 2109   PO2ART 152 (H) 04/07/2024 2109   HCO3 22.2 04/07/2024 2109   TCO2 23 04/07/2024 2109   ACIDBASEDEF 3.0 (H) 04/07/2024 2109   O2SAT 99 04/07/2024 2109     Coagulation Profile: No results for input(s): INR, PROTIME in the last 168 hours.  Cardiac Enzymes: No results for input(s): CKTOTAL, CKMB, CKMBINDEX, TROPONINI in the last 168 hours.  HbA1C: No results found for: HGBA1C  CBG: Recent Labs  Lab 04/07/24 1121 04/07/24 1608 04/07/24 2020 04/08/24 0006 04/08/24 0306  GLUCAP 149* 147* 188* 185* 178*   The patient is critically ill due to probable ischemia status post ex lap and resection/acute peritonitis with sepsis/acute respiratory insufficiency, postprocedure requiring titration of ventilator.  Critical care was necessary to treat or prevent imminent or life-threatening deterioration.  Critical care was time spent personally by me on the following activities: development of treatment plan with patient and/or surrogate as well as nursing, discussions with consultants, evaluation of patient's response to treatment, examination of patient, obtaining history from patient or surrogate, ordering and performing treatments and interventions, ordering and review of laboratory studies, ordering and review of radiographic studies, pulse oximetry, re-evaluation of patient's condition and participation in multidisciplinary rounds.   During this encounter critical care time was devoted to patient care services described in this note for 39 minutes.     Valinda Novas, MD [facility] See Amion for pager If no response to pager, please call [phone] until 7pm After 7pm, Please call E-link [phone]

## 2024-04-08 NOTE — Progress Notes (Addendum)
 PHARMACY - TOTAL PARENTERAL NUTRITION CONSULT NOTE   Indication: ischemic bowel   Patient Measurements: Height: 5' 6.5 (168.9 cm) Weight: 79.8 kg (175 lb 14.8 oz) IBW/kg (Calculated) : 64.95 TPN AdjBW (KG): 75.2 Body mass index is 27.97 kg/m.  Assessment: Patient is an 89 y.o M with hx afib who presented to the ED on 04/03/24 with c/o diarrhea and abdominal pain. Abd/pelvis CT on 04/03/24 showed findings with concern for ischemic enteritis and colitis. With significant TTP noted on exam on 04/05/24, plan is to proceed with exp lap with possible laparotomy and bowel resection.  Pharmacy has been consulted on 04/05/24 to dose TPN for poor nutritional status.  Off pressor support. Propofol  now discontinued 8/23 @ 0500. Will adjust TPN to add additional kcal and lipids.    Glucose / Insulin : no hx DM; mSSI q4h (19u), CBGs <180 Electrolytes: Na 134, K 4.4, Mag 2.1, Phos 2.5, CoCa 9.34, others WNL Renal: SCr < 1, BUN WNL  Hepatic: AST/ALT, Alk phos WNL.Tbili up at 2.2, Albumin  2.7, 8/21 Triglycerides 117 on propofol   Intake / Output; MIVF: UOP 0.2 ml/kg/hr, NG 50 mL, +11.6L since admission  GI Imaging: - 8/18 CT: suspicious for ischemic enteritis. Mild wall thickening and mucosal enhancement involving the hepatic flexure and proximal transverse colon, suspect for colitis of infectious, inflammatory or ischemic etiology. GI Surgeries / Procedures:  8/20 exploratory/diagnositic laparotmy with appendectomy and resection x2 of ischemic small bowel 8/21 mesenteric artery thrombectomy, angio, and stent  8/22 SBR, abd wall closure  Central access: PICC 8/20  TPN start date: 04/05/24   Nutritional Goals: Goal TPN rate (with lipids) is 75 mL/hr (provides ~110 g of protein and ~1900 kcals per day)  RD Assessment: Estimated Needs Total Energy Estimated Needs: 1800-1900 kcals Total Protein Estimated Needs: 105-125 g Total Fluid Estimated Needs: >/= 1.8 L  Current Nutrition:  8/21 NPO + Propofol    50 mcg/kg/min, 587 KCal/day 8/23 propofol  discontinued, lipids added back to TPN   Plan:  Increase TPN to 75 mL/hr at 1800 which provides 100% of estimated daily caloric needs Electrolytes in TPN: Na 85 mEq/L,K to 35 mEq/L, Ca to 3 mEq/L, Mg 76mEq/L, and Phos 15mmol/L. Cl:Ac 1:2 adjusted for rate Add standard MVI and trace elements in TPN  Increase to resistant SSI q4h SSI and adjust as needed  Thiamine  x 5 days IV  Monitor TPN labs on Mon/Thurs  Thank you for allowing pharmacy to be a part of this patient's care.  Shelba Collier, PharmD, BCPS Clinical Pharmacist

## 2024-04-08 NOTE — Progress Notes (Signed)
 Progress Note  1 Day Post-Op  Subjective:  Remains intubated and sedated.   Objective: Vital signs in last 24 hours: Temp:  [97.2 F (36.2 C)-98.5 F (36.9 C)] 98.5 F (36.9 C) (08/23 0345) Pulse Rate:  [87-129] 98 (08/23 0710) Resp:  [13-25] 20 (08/23 0710) BP: (131)/(60) 131/60 (08/23 0710) SpO2:  [99 %-100 %] 100 % (08/23 0710) Arterial Line BP: (73-285)/(36-277) 134/61 (08/23 0615) FiO2 (%):  [40 %] 40 % (08/23 0710) Weight:  [79.8 kg] 79.8 kg (08/23 0615) Last BM Date : 04/05/24  Intake/Output from previous day: 08/22 0701 - 08/23 0700 In: 3403.2 [I.V.:2653.2; IV Piggyback:750] Out: 810 [Urine:460; Emesis/NG output:50; Drains:300] Intake/Output this shift: No intake/output data recorded.  PE: Neosynepherine - 20 mcg/min  General: sedated on vent Heart: irregular, upper 90s Lungs: on vent Abd: soft, not significantly distended; midline fascia intact/closed; wound healthy   Lab Results:  Recent Labs    04/07/24 2101 04/07/24 2109 04/08/24 0507  WBC 17.6*  --  19.6*  HGB 9.1* 8.5* 9.8*  HCT 27.8* 25.0* 29.6*  PLT 245  --  281   BMET Recent Labs    04/07/24 2101 04/07/24 2109 04/08/24 0507  NA 134* 136 134*  K 4.2 4.3 4.4  CL 105  --  111  CO2 21*  --  22  GLUCOSE 184*  --  178*  BUN 16  --  20  CREATININE 0.46*  --  0.44*  CALCIUM  8.3*  --  8.3*   PT/INR No results for input(s): LABPROT, INR in the last 72 hours. CMP     Component Value Date/Time   NA 134 (L) 04/08/2024 0507   K 4.4 04/08/2024 0507   CL 111 04/08/2024 0507   CO2 22 04/08/2024 0507   GLUCOSE 178 (H) 04/08/2024 0507   BUN 20 04/08/2024 0507   CREATININE 0.44 (L) 04/08/2024 0507   CALCIUM  8.3 (L) 04/08/2024 0507   PROT 5.4 (L) 04/05/2024 1710   ALBUMIN  2.7 (L) 04/05/2024 1710   AST 25 04/05/2024 1710   ALT 18 04/05/2024 1710   ALKPHOS 45 04/05/2024 1710   BILITOT 2.2 (H) 04/05/2024 1710   GFRNONAA >60 04/08/2024 0507   GFRAA >60 03/06/2020 0204   Lipase      Component Value Date/Time   LIPASE 29 04/03/2024 2011       Studies/Results: ECHOCARDIOGRAM LIMITED Result Date: 04/06/2024    ECHOCARDIOGRAM LIMITED REPORT   Patient Name:   Donald [name] Date of Exam: 04/06/2024 Medical Rec #:  [id number]   Height:       66.5 in Accession #:    [id number]  Weight:       170.0 lb Date of Birth:  [birth year]   BSA:          1.877 m Patient Age:    89 years    BP:           100/56 mmHg Patient Gender: M           HR:           81 bpm. Exam Location:  Inpatient Procedure: Cardiac Doppler and Limited Color Doppler (Both Spectral and Color            Flow Doppler were utilized during procedure). Indications:    A fib  History:        Patient has no prior history of Echocardiogram examinations.                 Arrythmias:Atrial  Fibrillation.  Sonographer:    Benard Stallion Referring Phys: [id number] TARA N WILSON IMPRESSIONS  1. Left ventricular ejection fraction, by estimation, is 40 to 45%. The left ventricle has mildly decreased function. The left ventricle demonstrates regional wall motion abnormalities (see scoring diagram/findings for description). There is moderate concentric left ventricular hypertrophy. Left ventricular diastolic function could not be evaluated.  2. Right ventricular systolic function is mildly reduced. There is moderately elevated pulmonary artery systolic pressure. The estimated right ventricular systolic pressure is 45.9 mmHg.  3. Left atrial size was moderately dilated.  4. Right atrial size was mildly dilated.  5. The mitral valve is degenerative. Mild mitral valve regurgitation.  6. The aortic valve is tricuspid. There is moderate calcification of the aortic valve. Aortic valve sclerosis/calcification is present, without any evidence of aortic stenosis. FINDINGS  Left Ventricle: Left ventricular ejection fraction, by estimation, is 40 to 45%. The left ventricle has mildly decreased function. The left ventricle demonstrates regional wall motion  abnormalities. There is moderate concentric left ventricular hypertrophy. Left ventricular diastolic function could not be evaluated. Left ventricular diastolic function could not be evaluated due to atrial fibrillation.  LV Wall Scoring: The mid and distal anterior septum and mid inferoseptal segment are akinetic. The basal anteroseptal segment is hypokinetic. Right Ventricle: Right ventricular systolic function is mildly reduced. There is moderately elevated pulmonary artery systolic pressure. The tricuspid regurgitant velocity is 3.08 m/s, and with an assumed right atrial pressure of 8 mmHg, the estimated right ventricular systolic pressure is 45.9 mmHg. Left Atrium: Left atrial size was moderately dilated. Right Atrium: Right atrial size was mildly dilated. Mitral Valve: The mitral valve is degenerative in appearance. Mild mitral annular calcification. Mild mitral valve regurgitation. Tricuspid Valve: Tricuspid valve regurgitation is mild. Aortic Valve: The aortic valve is tricuspid. There is moderate calcification of the aortic valve. Aortic valve sclerosis/calcification is present, without any evidence of aortic stenosis. Aortic valve mean gradient measures 2.0 mmHg. Aortic valve peak gradient measures 4.2 mmHg. Aortic valve area, by VTI measures 2.24 cm. Pulmonic Valve: Pulmonic valve regurgitation is mild. LEFT VENTRICLE PLAX 2D LVIDd:         4.10 cm LVIDs:         3.10 cm LV PW:         1.10 cm LV IVS:        1.10 cm LVOT diam:     2.30 cm LV SV:         36 LV SV Index:   19 LVOT Area:     4.15 cm  RIGHT VENTRICLE TAPSE (M-mode): 1.6 cm LEFT ATRIUM         Index LA diam:    4.10 cm 2.18 cm/m  AORTIC VALVE AV Area (Vmax):    2.32 cm AV Area (Vmean):   2.32 cm AV Area (VTI):     2.24 cm AV Vmax:           102.00 cm/s AV Vmean:          69.200 cm/s AV VTI:            0.162 m AV Peak Grad:      4.2 mmHg AV Mean Grad:      2.0 mmHg LVOT Vmax:         57.00 cm/s LVOT Vmean:        38.600 cm/s LVOT VTI:           0.088 m LVOT/AV VTI ratio: 0.54  AORTA Ao  Root diam: 3.40 cm MITRAL VALVE               TRICUSPID VALVE MV Area (PHT): 5.31 cm    TR Peak grad:   37.9 mmHg MV Decel Time: 143 msec    TR Vmax:        308.00 cm/s MV E velocity: 78.80 cm/s MV A velocity: 24.90 cm/s  SHUNTS MV E/A ratio:  3.16        Systemic VTI:  0.09 m                            Systemic Diam: 2.30 cm Toribio Fuel MD Electronically signed by Toribio Fuel MD Signature Date/Time: 04/06/2024/6:00:21 PM    Final    HYBRID OR IMAGING ([facility] ONLY) Result Date: 04/06/2024 There is no interpretation for this exam.  This order is for images obtained during a surgical procedure.  Please See Surgeries Tab for more information regarding the procedure.   CT Angio Abd/Pel w/ and/or w/o Result Date: 04/06/2024 CLINICAL DATA:  Abdominal pain and diarrhea with clinical and imaging findings suspicious for acute mesenteric ischemia suggested on an abdomen and pelvis CT dated 04/03/2024. Status post laparoscopy and laparotomy with bowel resection, appendectomy and application of a wound VAC on 04/05/2024. EXAM: CTA ABDOMEN AND PELVIS WITHOUT AND WITH CONTRAST TECHNIQUE: Multidetector CT imaging of the abdomen and pelvis was performed using the standard protocol during bolus administration of intravenous contrast. Multiplanar reconstructed images and MIPs were obtained and reviewed to evaluate the vascular anatomy. RADIATION DOSE REDUCTION: This exam was performed according to the departmental dose-optimization program which includes automated exposure control, adjustment of the mA and/or kV according to patient size and/or use of iterative reconstruction technique. CONTRAST:  OMNIPAQUE  IOHEXOL  350 MG/ML SOLN COMPARISON:  Abdomen and pelvis CT dated 04/03/2024. FINDINGS: VASCULAR Aorta: Extensive atheromatous calcifications without aneurysm or dissection. No significant luminal stenosis. Celiac: Dense atheromatous calcifications at the origin  without significant stenosis. SMA: Marked calcified plaque at the origin with near occluding thrombus/noncalcified plaque in the proximal SMA. Additional thrombus in an estimated branch level extending into the right mid abdomen causing significant narrowing in the proximal aspect of this branch vessel and complete occlusion of the mid and distal portions. Renals: Dense calcifications at the origins of both renal arteries and in both proximal renal arteries with proximally 50% stenosis in the proximal left renal artery and approximately 90% stenosis in the proximal right renal artery. IMA: Calcified plaque in the proximal IMA causing approximately 30% stenosis. Inflow: Extensive calcified and some noncalcified plaque bilaterally with no areas of near occlusive stenosis. Proximal Outflow: Extensive calcified plaques bilaterally without near occlusive stenosis. Veins: No obvious venous abnormality within the limitations of this arterial phase study. Review of the MIP images confirms the above findings. NON-VASCULAR Lower chest: Enlarged heart. Small to moderate-sized right pleural effusion and small left pleural effusion. Mild associated bibasilar atelectasis. Hepatobiliary: Sludge and 3 mm calculus in the gallbladder without wall thickening. Small amount of pericholecystic fluid commensurate with a small amount of ascites in the abdomen. Pancreas: Unremarkable. No pancreatic ductal dilatation or surrounding inflammatory changes. Spleen: Normal in size without focal abnormality. Adrenals/Urinary Tract: 4.0 cm and 2.7 cm fat density masses in the left adrenal gland. Right adrenal hyperplasia as well as a 1.6 cm possible adenoma. Bilateral simple appearing renal cysts, not needing imaging follow-up. Unremarkable ureters. Foley catheter in the urinary bladder with no urine in  the bladder. Stomach/Bowel: Interval small bowel resection with a right lower quadrant anastomosis as well as midline and the left of midline at  anastomosis in the lower abdomen. Mildly dilated loops of jejunum in the left mid abdomen without wall thickening or pneumatosis. No bowel wall thickening or pneumatosis elsewhere in the abdomen. Multiple sigmoid colon diverticula without evidence of diverticulitis and proximal transverse colon diverticulum without evidence of diverticulitis. Surgically absent appendix. Normal-appearing stomach with a nasogastric tube in place. Lymphatic: No enlarged lymph nodes. Reproductive: Mildly to moderately enlarged prostate gland. Other: Multiple areas of postoperative free peritoneal fluid and air. Small umbilical hernia containing fat. Musculoskeletal: Marked lumbar and lower thoracic spine degenerative changes. IMPRESSION: 1. Near occluding thrombus/noncalcified plaque in the proximal SMA with additional thrombus in an SMA branch level extending into the right mid abdomen causing significant narrowing in the proximal aspect of this branch vessel and complete occlusion of the mid and distal portions. 2. Mildly dilated loops of jejunum in the left mid abdomen without wall thickening or pneumatosis. This is most likely due to ileus. 3. Interval small bowel resection with a right lower quadrant anastomosis as well as midline and left of midline anastomoses in the lower abdomen. 4. Multiple areas of postoperative free peritoneal fluid and air. 5. Small to moderate-sized right pleural effusion and small left pleural effusion with mild associated bibasilar atelectasis. 6. Extensive calcified plaques involving the aorta and its branches, as described above. 7. Cholelithiasis and sludge in the gallbladder without evidence of cholecystitis. 8. 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 9. 1.6 cm probable right adrenal adenoma. 10. Colonic diverticulosis without evidence of diverticulitis. 11. Mildly to moderately enlarged prostate gland. 12. Small umbilical hernia containing fat. Critical Value/emergent results were called by  telephone at the time of interpretation on 04/06/2024 at 11:29 am to provider Vina Pesa, NP, who verbally acknowledged these results. Aortic Atherosclerosis (ICD10-I70.0). Electronically Signed   By: Elspeth Bathe M.D.   On: 04/06/2024 11:39   CT CHEST WO CONTRAST Result Date: 04/06/2024 CLINICAL DATA:  Right middle lobe pneumonia EXAM: CT CHEST WITHOUT CONTRAST TECHNIQUE: Multidetector CT imaging of the chest was performed following the standard protocol without IV contrast. RADIATION DOSE REDUCTION: This exam was performed according to the departmental dose-optimization program which includes automated exposure control, adjustment of the mA and/or kV according to patient size and/or use of iterative reconstruction technique. COMPARISON:  Multiple recent radiographs. FINDINGS: Cardiovascular: The study was performed without contrast. Heart is mildly enlarged. There is extensive coronary artery calcification. Previous CABG. Extensive aortic atherosclerotic calcification. Mediastinum/Nodes: Right paratracheal and Peri carinal lymphadenopathy consistent with metastatic disease. Right-sided Peri carinal lymph node measures 3.7 x 2.9 cm. Lungs/Pleura: Left chest shows a small effusion layering dependently with mild dependent atelectasis and mild emphysematous change in the upper lung. Right chest shows a moderate effusion layering dependently with dependent atelectasis. Pulmonary infiltrate is present within the right upper lobe consistent with bronchopneumonia. There is a right upper lobe mass measuring approximately 5.7 x 4.1 x 6.0 cm consistent with primary lung carcinoma. This is metastatic to the hilar, Peri carinal and right paratracheal nodes as noted above. Upper Abdomen: No acute upper abdominal finding. Fat-density adrenal masses consistent with adenomas. Musculoskeletal: Ordinary thoracic degenerative changes. IMPRESSION: 1. 5.7 x 4.1 x 6.0 cm right upper lobe mass consistent with primary lung carcinoma.  This is metastatic to the right hilar, pericarinal and right paratracheal nodes. 2. Right upper lobe pulmonary infiltrate consistent with bronchopneumonia. 3. Moderate  right effusion layering dependently with dependent atelectasis. Small left effusion layering dependently with mild dependent atelectasis. 4. Aortic atherosclerosis. Coronary artery calcification. Previous CABG. 5. Fat-density adrenal masses consistent with adenomas. Aortic Atherosclerosis (ICD10-I70.0). Electronically Signed   By: Oneil Officer M.D.   On: 04/06/2024 11:05    Anti-infectives: Anti-infectives (From admission, onward)    Start     Dose/Rate Route Frequency Ordered Stop   04/04/24 2200  ceFEPIme  (MAXIPIME ) 2 g in sodium chloride  0.9 % 100 mL IVPB        2 g 200 mL/hr over 30 Minutes Intravenous Every 12 hours 04/04/24 1332     04/04/24 1300  metroNIDAZOLE  (FLAGYL ) IVPB 500 mg        500 mg 100 mL/hr over 60 Minutes Intravenous Every 12 hours 04/04/24 0825     04/04/24 0900  ceFEPIme  (MAXIPIME ) 2 g in sodium chloride  0.9 % 100 mL IVPB  Status:  Discontinued        2 g 200 mL/hr over 30 Minutes Intravenous Every 8 hours 04/04/24 0842 04/04/24 1332   04/04/24 0030  ceFEPIme  (MAXIPIME ) 2 g in sodium chloride  0.9 % 100 mL IVPB        2 g 200 mL/hr over 30 Minutes Intravenous  Once 04/04/24 0025 04/04/24 0107   04/04/24 0030  metroNIDAZOLE  (FLAGYL ) IVPB 500 mg        500 mg 100 mL/hr over 60 Minutes Intravenous  Once 04/04/24 0025 04/04/24 0137        Assessment/Plan POD 3, s/p ex lap with SBRx2 in discontinuity, open abdomen, Dr. Tanda 8/20 for ischemic bowel POD 2 endovascular thrombectomy of SMA w/ stenting (Dr. Magda) POD 1 SBR with re-anastomosis and abd closure   - Appreciate CCM assistance  - Continue TPN; holding enteral nutrition until reliable return of bowel fxn    FEN: NPO, NG/OG (famotidine  for some blood-tinged output), PICC/TNA VTE: Eliquis  on hold, heparin  gtt ID: cefepime /flagyl    - per  TRH -  A. Fib on Eliquis  - LD 8/18 PM CAD s/p CABG HTN HLD CHF - last ECHO in 2021 with EF 65% Hx of perforated appendicitis treated with abx    LOS: 4 days   I reviewed hospitalist notes, last 24 h vitals and pain scores, last 48 h intake and output, last 24 h labs and trends, and last 24 h imaging results.  CRITICAL CARE Performed by: Lonni CHRISTELLA Pizza   Total critical care time: 30 minutes  Critical care time was exclusive of separately billable procedures and treating other patients.  Critical care was necessary to treat or prevent imminent or life-threatening deterioration.  Critical care was time spent personally by me on the following activities: development of treatment plan with patient and/or surrogate as well as nursing, discussions with consultants, evaluation of patient's response to treatment, examination of patient, obtaining history from patient or surrogate, ordering and performing treatments and interventions, ordering and review of laboratory studies, ordering and review of radiographic studies, pulse oximetry and re-evaluation of patient's condition.  Lonni CHRISTELLA Pizza, MD  [facility] 04/08/2024, 8:31 AM Please see Amion for pager number for on call team member

## 2024-04-08 NOTE — Progress Notes (Signed)
    Subjective  - POD # 2, status post SMA thrombectomy and stenting     Physical Exam:  Remains intubated Right groin cannulation site without hematoma Extremities warm and well-perfused       Assessment/Plan:  POD #2  Access site without complications. Recommend aspirin  81 mg Continue heparin  drip with transition to DOAC  [name] 04/08/2024 9:04 AM --  Vitals:   04/08/24 0710 04/08/24 0850  BP: 131/60 (!) 153/66  Pulse: 98 (!) 117  Resp: 20 14  Temp:    SpO2: 100% 100%    Intake/Output Summary (Last 24 hours) at 04/08/2024 0904 Last data filed at 04/08/2024 0600 Gross per 24 hour  Intake 3056.3 ml  Output 750 ml  Net 2306.3 ml     Laboratory CBC    Component Value Date/Time   WBC 19.6 (H) 04/08/2024 0507   HGB 9.8 (L) 04/08/2024 0507   HCT 29.6 (L) 04/08/2024 0507   PLT 281 04/08/2024 0507    BMET    Component Value Date/Time   NA 134 (L) 04/08/2024 0507   K 4.4 04/08/2024 0507   CL 111 04/08/2024 0507   CO2 22 04/08/2024 0507   GLUCOSE 178 (H) 04/08/2024 0507   BUN 20 04/08/2024 0507   CREATININE 0.44 (L) 04/08/2024 0507   CALCIUM  8.3 (L) 04/08/2024 0507   GFRNONAA >60 04/08/2024 0507   GFRAA >60 03/06/2020 0204    COAG No results found for: INR, PROTIME No results found for: PTT  Antibiotics Anti-infectives (From admission, onward)    Start     Dose/Rate Route Frequency Ordered Stop   04/04/24 2200  ceFEPIme  (MAXIPIME ) 2 g in sodium chloride  0.9 % 100 mL IVPB        2 g 200 mL/hr over 30 Minutes Intravenous Every 12 hours 04/04/24 1332     04/04/24 1300  metroNIDAZOLE  (FLAGYL ) IVPB 500 mg        500 mg 100 mL/hr over 60 Minutes Intravenous Every 12 hours 04/04/24 0825     04/04/24 0900  ceFEPIme  (MAXIPIME ) 2 g in sodium chloride  0.9 % 100 mL IVPB  Status:  Discontinued        2 g 200 mL/hr over 30 Minutes Intravenous Every 8 hours 04/04/24 0842 04/04/24 1332   04/04/24 0030  ceFEPIme  (MAXIPIME ) 2 g in sodium  chloride 0.9 % 100 mL IVPB        2 g 200 mL/hr over 30 Minutes Intravenous  Once 04/04/24 0025 04/04/24 0107   04/04/24 0030  metroNIDAZOLE  (FLAGYL ) IVPB 500 mg        500 mg 100 mL/hr over 60 Minutes Intravenous  Once 04/04/24 0025 04/04/24 0137        V. Malvina Serene CLORE, M.D., [facility] [place] Office: [phone] Pager:  [phone]

## 2024-04-08 NOTE — Anesthesia Postprocedure Evaluation (Signed)
 Anesthesia Post Note  Patient: Donald [name]  Procedure(s) Performed: LAPAROTOMY, EXPLORATORY, WITH ABDOMINAL WALL CLOSURE (Abdomen) EXCISION, SMALL INTESTINE (Abdomen)     Patient location during evaluation: ICU Anesthesia Type: General Level of consciousness: patient remains intubated per anesthesia plan Pain management: satisfactory to patient Vital Signs Assessment: post-procedure vital signs reviewed and stable Respiratory status: patient on ventilator - see flowsheet for VS Cardiovascular status: blood pressure returned to baseline and stable Postop Assessment: no apparent nausea or vomiting Anesthetic complications: no   No notable events documented.  Last Vitals:  Vitals:   04/08/24 0615 04/08/24 0710  BP:  131/60  Pulse: (!) 105 98  Resp: 14 20  Temp:    SpO2: 100% 100%    Last Pain:  Vitals:   04/08/24 0400  TempSrc:   PainSc: 2                  Butler Levander Pinal

## 2024-04-08 NOTE — Progress Notes (Addendum)
 PHARMACY - ANTICOAGULATION CONSULT NOTE  Pharmacy Consult for Heparin  Indication: atrial fibrillation (apixaban  on hold), ischemic bowel  Allergies  Allergen Reactions   Bee Venom Other (See Comments)    Other reaction(s): fainting, decreased BP   Chromium Other (See Comments)    rash   Gabapentin  Other (See Comments)    Can't take capsules, tablets are tolerated 7/18 pt states allergy is resolved now, can take capsules    Sulfa Antibiotics Rash    Patient Measurements: Height: 5' 6.5 (168.9 cm) Weight: 79.8 kg (175 lb 14.8 oz) IBW/kg (Calculated) : 64.95 HEPARIN  DW (KG): 75.2  Vital Signs: Temp: 98.5 F (36.9 C) (08/23 0345) Temp Source: Oral (08/23 0345) BP: 131/60 (08/23 0710) Pulse Rate: 98 (08/23 0710)  Labs: Recent Labs    04/06/24 0420 04/06/24 1456 04/07/24 0238 04/07/24 1000 04/07/24 1753 04/07/24 2101 04/07/24 2109 04/08/24 0507  HGB 9.4*   < > 9.6*  --   --  9.1* 8.5* 9.8*  HCT 28.5*   < > 29.2*  --   --  27.8* 25.0* 29.6*  PLT 196  --  270  --   --  245  --  281  APTT 101*  --   --  59* 71*  --   --   --   HEPARINUNFRC 0.73*  --   --  0.30 0.35  --   --  0.26*  CREATININE 0.50*  --  0.47*  --   --  0.46*  --  0.44*   < > = values in this interval not displayed.    Estimated Creatinine Clearance: 62.8 mL/min (A) (by C-G formula based on SCr of 0.44 mg/dL (L)).  Assessment: 47 yoM presented to ED on 8/19 with abdominal pain, found to have ischemic small bowel s/p exploratory/diagnostic laparotmy with appendectomy and resection x2 of small bowel on 8/20.  Pharmacy is consulted to dose Heparin  IV for ischemic bowel and history of Afib on apixaban  prior to admission.  Lase dose given on 8/18 at 17:30. Enoxaparin  prophylaxis started on admission and given on 8/19 at 10:15.  S/p OR for mechanical thrombectomy of superior mesenteric artery 8/21.Patient went back to OR on 8/22 PM for re-opening of laparotomy and SB resection. Heparin  was continued  throughout the procedure  8/23 AM: Heparin  level 0.26, borderline subtherapeutic on heparin  1000 units/hr. No issues with infusion running per RN. Noted patient has pink-tinged fluid coming out of wound vac. RN also noted patient had pink-tinged urine yesterday which has improved today. Will keep heparin  rate the same and recheck level in 8 hours. CBC remains stable (Hgb 9.8, PLT 281).   Goal of Therapy:  Heparin  level 0.3-0.7 units/ml aPTT 66-102 seconds Monitor platelets by anticoagulation protocol: Yes   Plan:  Continue heparin  1000 units/hr Recheck heparin  level in 8 hours Monitor heparin  level, CBC, and s/sx of bleeding daily  ADDENDUM 1400: Patient remains in atrial fibrillation. Repeat heparin  level 0.24, subtherapeutic on heparin  1000 units/hr. No new signs of bleeding noted, will cautiously increase heparin  to 1100 units/hr and recheck level in 8 hours.   Thank you for allowing pharmacy to participate in this patient's care,  Morna Breach, PharmD PGY2 Cardiology Pharmacy Resident 04/08/2024 8:32 AM  Please check AMION for all [facility] Pharmacy phone numbers After 10:00 PM, call Main Pharmacy [phone]

## 2024-04-08 NOTE — Progress Notes (Signed)
 eLink Physician-Brief Progress Note Patient Name: Donald [name] DOB: [birth year] MRN: [id number]   Date of Service  04/08/2024  HPI/Events of Note  POD # 2, status post SMA thrombectomy and stenting with extensive bowel resection and appendectomy.  Now off of vasopressors, remains ventilated with hypertension at baseline Hypertensive into the 160s systolic, wide pulse pressures  eICU Interventions  Added as needed labetalol  labetalol      Intervention Category Intermediate Interventions: Hypertension - evaluation and management  Walter Grima 04/08/2024, 10:48 PM

## 2024-04-09 ENCOUNTER — Encounter (HOSPITAL_COMMUNITY): Payer: Self-pay | Admitting: Internal Medicine

## 2024-04-09 DIAGNOSIS — J90 Pleural effusion, not elsewhere classified: Secondary | ICD-10-CM | POA: Diagnosis not present

## 2024-04-09 DIAGNOSIS — I4891 Unspecified atrial fibrillation: Secondary | ICD-10-CM | POA: Diagnosis not present

## 2024-04-09 DIAGNOSIS — J9601 Acute respiratory failure with hypoxia: Secondary | ICD-10-CM

## 2024-04-09 DIAGNOSIS — K559 Vascular disorder of intestine, unspecified: Secondary | ICD-10-CM | POA: Diagnosis not present

## 2024-04-09 DIAGNOSIS — A419 Sepsis, unspecified organism: Secondary | ICD-10-CM | POA: Diagnosis not present

## 2024-04-09 LAB — BASIC METABOLIC PANEL WITH GFR
Anion gap: 8 (ref 5–15)
BUN: 28 mg/dL — ABNORMAL HIGH (ref 8–23)
CO2: 22 mmol/L (ref 22–32)
Calcium: 8.3 mg/dL — ABNORMAL LOW (ref 8.9–10.3)
Chloride: 107 mmol/L (ref 98–111)
Creatinine, Ser: 0.56 mg/dL — ABNORMAL LOW (ref 0.61–1.24)
GFR, Estimated: >60 mL/min (ref >60)
Glucose, Bld: 220 mg/dL — ABNORMAL HIGH (ref 70–99)
Potassium: 4.4 mmol/L (ref 3.5–5.1)
Sodium: 137 mmol/L (ref 135–145)

## 2024-04-09 LAB — HEPARIN LEVEL (UNFRACTIONATED)
Heparin Unfractionated: 0.19 [IU]/mL — ABNORMAL LOW (ref 0.30–0.70)
Heparin Unfractionated: 0.31 [IU]/mL (ref 0.30–0.70)

## 2024-04-09 LAB — CBC
HCT: 26.9 % — ABNORMAL LOW (ref 39.0–52.0)
Hemoglobin: 8.7 g/dL — ABNORMAL LOW (ref 13.0–17.0)
MCH: 32.1 pg (ref 26.0–34.0)
MCHC: 32.3 g/dL (ref 30.0–36.0)
MCV: 99.3 fL (ref 80.0–100.0)
Platelets: 290 K/uL (ref 150–400)
RBC: 2.71 MIL/uL — ABNORMAL LOW (ref 4.22–5.81)
RDW: 12.4 % (ref 11.5–15.5)
WBC: 21 K/uL — ABNORMAL HIGH (ref 4.0–10.5)
nRBC: 0 % (ref 0.0–0.2)

## 2024-04-09 LAB — HEMOGLOBIN A1C
Hgb A1c MFr Bld: 6.5 % — ABNORMAL HIGH (ref 4.8–5.6)
Mean Plasma Glucose: 139.85 mg/dL

## 2024-04-09 LAB — PHOSPHORUS: Phosphorus: 2.5 mg/dL (ref 2.5–4.6)

## 2024-04-09 LAB — GLUCOSE, CAPILLARY
Glucose-Capillary: 211 mg/dL — ABNORMAL HIGH (ref 70–99)
Glucose-Capillary: 222 mg/dL — ABNORMAL HIGH (ref 70–99)
Glucose-Capillary: 208 mg/dL — ABNORMAL HIGH (ref 70–99)
Glucose-Capillary: 175 mg/dL — ABNORMAL HIGH (ref 70–99)
Glucose-Capillary: 168 mg/dL — ABNORMAL HIGH (ref 70–99)

## 2024-04-09 LAB — TRIGLYCERIDES: Triglycerides: 47 mg/dL (ref <150)

## 2024-04-09 LAB — MAGNESIUM: Magnesium: 2 mg/dL (ref 1.7–2.4)

## 2024-04-09 MED ORDER — INSULIN GLARGINE 100 UNIT/ML ~~LOC~~ SOLN
10.0000 [IU] | Freq: Two times a day (BID) | SUBCUTANEOUS | Status: DC
  Administered 2024-04-09: 10 [IU] via SUBCUTANEOUS
  Filled 2024-04-09 (×2): qty 0.1

## 2024-04-09 MED ORDER — MORPHINE 100MG IN NS 100ML (1MG/ML) PREMIX INFUSION
0.0000 mg/h | INTRAVENOUS | Status: DC
  Administered 2024-04-09: 5 mg/h via INTRAVENOUS
  Administered 2024-04-10 (×2): 10 mg/h via INTRAVENOUS
  Administered 2024-04-10: 20 mg/h via INTRAVENOUS
  Filled 2024-04-09 (×4): qty 100

## 2024-04-09 MED ORDER — GLYCOPYRROLATE 1 MG PO TABS: 1.0000 mg | ORAL_TABLET | ORAL | Status: DC | PRN

## 2024-04-09 MED ORDER — GLYCOPYRROLATE 0.2 MG/ML IJ SOLN
0.2000 mg | INTRAMUSCULAR | Status: DC | PRN
  Administered 2024-04-09 – 2024-04-10 (×3): 0.2 mg via INTRAVENOUS
  Filled 2024-04-09 (×3): qty 1

## 2024-04-09 MED ORDER — SODIUM CHLORIDE 0.9 % IV SOLN: INTRAVENOUS | Status: AC

## 2024-04-09 MED ORDER — ORAL CARE MOUTH RINSE
15.0000 mL | OROMUCOSAL | Status: DC
  Administered 2024-04-09 (×4): 15 mL via OROMUCOSAL

## 2024-04-09 MED ORDER — HEPARIN (PORCINE) 25000 UT/250ML-% IV SOLN
1300.0000 [IU]/h | INTRAVENOUS | Status: DC
  Administered 2024-04-09: 1300 [IU]/h via INTRAVENOUS
  Filled 2024-04-09: qty 250

## 2024-04-09 MED ORDER — ORAL CARE MOUTH RINSE
15.0000 mL | OROMUCOSAL | Status: DC | PRN
Start: 2024-04-09 — End: 2024-04-09

## 2024-04-09 MED ORDER — TRACE MINERALS CU-MN-SE-ZN 300-55-60-3000 MCG/ML IV SOLN
INTRAVENOUS | Status: DC
  Filled 2024-04-09: qty 744

## 2024-04-09 MED ORDER — GLYCOPYRROLATE 0.2 MG/ML IJ SOLN: 0.2000 mg | INTRAMUSCULAR | Status: DC | PRN

## 2024-04-09 MED ORDER — POLYVINYL ALCOHOL 1.4 % OP SOLN: 1.0000 [drp] | Freq: Four times a day (QID) | OPHTHALMIC | Status: DC | PRN

## 2024-04-09 MED ORDER — MORPHINE BOLUS VIA INFUSION
5.0000 mg | INTRAVENOUS | Status: DC | PRN
  Administered 2024-04-09 – 2024-04-10 (×5): 5 mg via INTRAVENOUS

## 2024-04-09 MED ORDER — SODIUM CHLORIDE 0.9 % IV SOLN: 2.0000 g | Freq: Three times a day (TID) | INTRAVENOUS | Status: DC

## 2024-04-09 MED ORDER — ORAL CARE MOUTH RINSE: 15.0000 mL | OROMUCOSAL | Status: DC | PRN

## 2024-04-09 MED ORDER — ACETAMINOPHEN 325 MG PO TABS: 650.0000 mg | ORAL_TABLET | Freq: Four times a day (QID) | ORAL | Status: DC | PRN

## 2024-04-09 MED ORDER — ACETAMINOPHEN 650 MG RE SUPP
650.0000 mg | Freq: Four times a day (QID) | RECTAL | Status: DC | PRN
Start: 2024-04-09 — End: 2024-04-09

## 2024-04-09 NOTE — Progress Notes (Signed)
 PHARMACY - ANTICOAGULATION CONSULT NOTE  Pharmacy Consult for Heparin  Indication: atrial fibrillation (apixaban  on hold), ischemic bowel  Allergies  Allergen Reactions   Bee Venom Other (See Comments)    Other reaction(s): fainting, decreased BP   Chromium Other (See Comments)    rash   Gabapentin  Other (See Comments)    Can't take capsules, tablets are tolerated 7/18 pt states allergy is resolved now, can take capsules    Sulfa Antibiotics Rash    Patient Measurements: Height: 5' 6.5 (168.9 cm) Weight: 79.8 kg (175 lb 14.8 oz) IBW/kg (Calculated) : 64.95 HEPARIN  DW (KG): 75.2  Vital Signs: Temp: 98.8 F (37.1 C) (08/23 2324) Temp Source: Axillary (08/23 2324) BP: 149/64 (08/23 1531) Pulse Rate: 81 (08/24 0057)  Labs: Recent Labs    04/06/24 0420 04/06/24 1456 04/07/24 0238 04/07/24 1000 04/07/24 1753 04/07/24 2101 04/07/24 2109 04/08/24 0507 04/08/24 1308 04/09/24 0008  HGB 9.4*   < > 9.6*  --   --  9.1* 8.5* 9.8*  --   --   HCT 28.5*   < > 29.2*  --   --  27.8* 25.0* 29.6*  --   --   PLT 196  --  270  --   --  245  --  281  --   --   APTT 101*  --   --  59* 71*  --   --   --   --   --   HEPARINUNFRC 0.73*  --   --  0.30 0.35  --   --  0.26* 0.24* 0.19*  CREATININE 0.50*  --  0.47*  --   --  0.46*  --  0.44*  --   --    < > = values in this interval not displayed.    Estimated Creatinine Clearance: 62.8 mL/min (A) (by C-G formula based on SCr of 0.44 mg/dL (L)).  Assessment: 50 yoM presented to ED on 8/19 with abdominal pain, found to have ischemic small bowel s/p exploratory/diagnostic laparotmy with appendectomy and resection x2 of small bowel on 8/20.  Pharmacy is consulted to dose Heparin  IV for ischemic bowel and history of Afib on apixaban  prior to admission.  Lase dose given on 8/18 at 17:30. Enoxaparin  prophylaxis started on admission and given on 8/19 at 10:15.  S/p OR for mechanical thrombectomy of superior mesenteric artery 8/21.Patient went back  to OR on 8/22 PM for re-opening of laparotomy and SB resection. Heparin  was continued throughout the procedure   8/24 1:00 AM Heparin  level decreased to 0.19 despite heparin  increased to 1100 units/hr.  No issues or interruptions with heparin  reported per RN. Previously had noted pink fluid coming out of wound vac but  this had improved on 8/23 with no further signs of bleeding noted.  RN confirms no bleeding noted currently.     Heparin  level is subtherapeutic, thus will increase heparin  rate.   Goal of Therapy:  Heparin  level 0.3-0.7 units/ml Monitor platelets by anticoagulation protocol: Yes   Plan:  Increase heparin  rate to 1300 units/hr Recheck heparin  level in 8 hours Monitor heparin  level, CBC, and s/sx of bleeding daily   Thank you for allowing pharmacy to participate in this patient's care,  Levorn Gaskins, RPh Clinical Pharmacist 04/09/2024 1:00 AM  Please check AMION for all [facility] Pharmacy phone numbers After 10:00 PM, call Main Pharmacy [phone]

## 2024-04-09 NOTE — Progress Notes (Signed)
 PHARMACY - ANTICOAGULATION CONSULT NOTE  Pharmacy Consult for Heparin  Indication: atrial fibrillation (apixaban  on hold), ischemic bowel  Allergies  Allergen Reactions   Bee Venom Other (See Comments)    Other reaction(s): fainting, decreased BP   Chromium Other (See Comments)    rash   Gabapentin  Other (See Comments)    Can't take capsules, tablets are tolerated 7/18 pt states allergy is resolved now, can take capsules    Sulfa Antibiotics Rash    Patient Measurements: Height: 5' 6.5 (168.9 cm) Weight: 84.1 kg (185 lb 6.5 oz) IBW/kg (Calculated) : 64.95 HEPARIN  DW (KG): 75.2  Vital Signs: Temp: 99.3 F (37.4 C) (08/24 0819) Temp Source: Oral (08/24 0819) BP: 166/67 (08/24 1058) Pulse Rate: 101 (08/24 1100)  Labs: Recent Labs    04/07/24 1000 04/07/24 1753 04/07/24 2101 04/07/24 2109 04/08/24 0507 04/08/24 1308 04/09/24 0008 04/09/24 0421 04/09/24 0940  HGB  --   --  9.1* 8.5* 9.8*  --   --  8.7*  --   HCT  --   --  27.8* 25.0* 29.6*  --   --  26.9*  --   PLT  --   --  245  --  281  --   --  290  --   APTT 59* 71*  --   --   --   --   --   --   --   HEPARINUNFRC 0.30 0.35  --   --  0.26* 0.24* 0.19*  --  0.31  CREATININE  --   --  0.46*  --  0.44*  --   --  0.56*  --     Estimated Creatinine Clearance: 64.3 mL/min (A) (by C-G formula based on SCr of 0.56 mg/dL (L)).  Assessment: 4 yoM presented to ED on 8/19 with abdominal pain, found to have ischemic small bowel s/p exploratory/diagnostic laparotmy with appendectomy and resection x2 of small bowel on 8/20.  Pharmacy is consulted to dose Heparin  IV for ischemic bowel and history of Afib on apixaban  prior to admission.  Lase dose given on 8/18 at 17:30. Enoxaparin  prophylaxis started on admission and given on 8/19 at 10:15.  S/p OR for mechanical thrombectomy of superior mesenteric artery 8/21.Patient went back to OR on 8/22 PM for re-opening of laparotomy and SB resection. Heparin  was continued throughout the  procedure  8/24 1300: Heparin  level 0.31, therapeutic on heparin  1300 units/hr. Previously had noted pink fluid coming out of wound vac but  this had improved on 8/23 with no further signs of bleeding noted. No issues with infusion running per RN. CBC stable (Hgb 8.7, PLT 290).    Goal of Therapy:  Heparin  level 0.3-0.7 units/ml Monitor platelets by anticoagulation protocol: Yes   Plan:  Continue heparin  rate 1300 units/hr Recheck confirmatory heparin  level in 8 hours Monitor heparin  level, CBC, and s/sx of bleeding daily  Thank you for allowing pharmacy to participate in this patient's care,  Morna Breach, PharmD PGY2 Cardiology Pharmacy Resident 04/09/2024 12:33 PM  Please check AMION for all [facility] Pharmacy phone numbers After 10:00 PM, call Main Pharmacy [phone]

## 2024-04-09 NOTE — IPAL (Signed)
 Interdisciplinary Goals of Care Family Meeting   Date carried out:: 04/09/2024  Location of the meeting: Bedside  Member's involved: Physician, Bedside Registered Nurse, and Family Member or next of kin  Durable Power of Attorney or acting medical decision maker: Loa Ahle    Discussion: We discussed goals of care for Donald [name] .    The Clinical status was relayed to patient's family including patient's spouse who is (DPOAH) at bedside in detail.   Updated and notified of patients medical condition.     Patient remains minimally responsive and will not open eyes to command.   Patient is having a weak cough and struggling to remove secretions.   Patient with increased WOB and using accessory muscles to breathe Explained to family course of therapy and the modalities   Options give 1. To put him back on ventilator, with severe generalized weakness, recent multiple surgeries and new diagnosis of metastatic lung cancer, it will very hard for him to come off of ventilator 2. Provide comfort focused care   After long discussion, patient's family decided to proceed with comfort care  Code status: Full DNR  Disposition: In-patient comfort care    Family are satisfied with Plan of action and management. All questions answered   Valinda Novas MD [facility] See Amion for pager If no response to pager, please call [phone] until 7pm After 7pm, Please call E-link [phone]

## 2024-04-09 NOTE — Progress Notes (Addendum)
 NAME:  Donald [name], MRN:  [id number], DOB:  07-11-1933, LOS: 5 ADMISSION DATE:  04/03/2024, CONSULTATION DATE:  04/05/2024 REFERRING MD:  Dr. Camellia Blush, CHIEF COMPLAINT:  ischemic small bowel   History of Present Illness:  Donald [name] is a 89 year old male with PMH significant for Afib on Eliquis , CAD s/p CABGx3 (1998), HTN and HLD who presented on 04/03/2024 with 1 week of diarrhea and right lower quadrant abdominal pain. 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 Patient was admitted for further management and general surgery was consulted. Initially patient was managed conservatively on the floor but giving ongoing abdominal pain, worsening leukocytosis and persistent tachycardia the decision was made to proceed with diagnostic ex-lap on 8/20. Ex-lap showed small bowel ischemia and he underwent small bowel resection x 2 and appendectomy. He had other areas suspicious for ischemia so he was left open in discontinuity with plan to return for a second look on Friday 8/22. He was brought to ICU for ventilator management.   Pertinent  Medical History  Afib on Eliquis   CAD s/p CABGx3 (1998)  HTN HLD  Significant Hospital Events: Including procedures, antibiotic start and stop dates in addition to other pertinent events   04/03/2024: admitted for ischemic versus infectious enteritis 04/05/2024: OR for diagnostic ex-lap with ischemic small bowel s/p small bowel resection x 2, other concerning areas for ischemia, left open and in discontinuity. Transferred to ICU on ventilator. 8/21 OR w [facility] for SMA thrombectomy and angioplasty 8/22 return OR w [facility] underwent small bowel resection and abdominal wall closure 8/23 off sedation, tolerating spontaneous breathing trial, still sleepy, not ready for extubation, afebrile  Interim History / Subjective:   No overnight issues Remained on spontaneous breathing trial 5/5, tolerating well Generalized weak but started to wake up  Objective    Blood pressure (!) 146/58, pulse 81, temperature 99.3 F (37.4 C), temperature source Oral, resp. rate 14, height 5' 6.5 (1.689 m), weight 84.1 kg, SpO2 100%.    Vent Mode: PSV;CPAP FiO2 (%):  [40 %] 40 % Set Rate:  [18 bmp] 18 bmp Vt Set:  [510 mL] 510 mL PEEP:  [5 cmH20] 5 cmH20 Pressure Support:  [5 cmH20] 5 cmH20 Plateau Pressure:  [13 cmH20] 13 cmH20   Intake/Output Summary (Last 24 hours) at 04/09/2024 9167 Last data filed at 04/09/2024 0600 Gross per 24 hour  Intake 2871.72 ml  Output 780 ml  Net 2091.72 ml   Filed Weights   04/06/24 0433 04/08/24 0615 04/09/24 0540  Weight: 77.1 kg 79.8 kg 84.1 kg   Examination: General: Crtitically ill-appearing elderly male, orally intubated HEENT: [place]/AT, eyes anicteric.  ETT and OGT in place Neuro: Opens eyes with vocal stimuli, following simple commands like squeezing hands, sticking out on but generalized weak Chest: Coarse breath sounds, no wheezes or rhonchi Heart: Irregularly irregular, no murmurs or gallops Abdomen: Surgical incision looks clean and dry, soft, nondistended, absent bowel sounds  Labs and images reviewed  Patient Lines/Drains/Airways Status     Active Line/Drains/Airways     Name Placement date Placement time Site Days   Arterial Line 04/06/24 Left Radial 04/06/24  1617  Radial  3   Peripheral IV 04/05/24 18 G Anterior;Left;Proximal;Upper Arm 04/05/24  1200  Arm  4   PICC Triple Lumen 04/05/24 Right Brachial 39 cm 0 cm 04/05/24  1824  -- 4  NG/OG Vented/Dual Lumen 18 Fr. Oral 04/05/24  1353  Oral  4   Urethral Catheter CHRISTELLA Mace RN Double-lumen;Latex 16 Fr. 04/05/24  1253  Double-lumen;Latex  4   Airway 7.5 mm 04/05/24  1247  -- 4   Wound 04/05/24 1410 Surgical Laparoscopic Abdomen Left Left;Upper Left;Mid Left;Lower 04/05/24  1410  Abdomen  4   Wound 04/05/24 1458  Surgical Open Surgical Incision Abdomen Anterior;Mid;Lower 04/05/24  1458  Abdomen  4   Wound 04/06/24 1722 Surgical Closed Surgical Incision Groin Right 04/06/24  1722  Groin  3         Resolved problem list  Sedation related hypotension Hematuria due to traumatic Foley insertion  Assessment and Plan  Bowel ischemia s/p ex lap small bowel resection x3 + appendectomy  Sepsis due to acute peritonitis General Surgery following Since abdomen is closed now with a skin pen Continue cefepime  and metronidazole  Continue OGT on low intermittent wall suction, output is decreasing, it was 150 cc in last 24 hours N.p.o. until return of bowel function  SMA occlusion s/p thrombectomy, angioplasty and stent placement  Surgery is following Continue IV heparin  infusion Will start aspirin  once able to take oral meds  Acute respiratory insufficiency, postprocedure Bilateral R>L pleural effusion  R lung mass, 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 Is tolerating spontaneous breathing trial, he is generalized weak Started to wake up, we will try to extubate him today Remain off sedation Holding off on thoracentesis Outpatient follow-up with pulmonary/oncology  Chronic A-fib with controlled rate Patient is on Toprol  at home, currently on hold Heart rate is well-controlled Continue amiodarone  infusion Continue IV heparin  infusion for stroke prophylaxis  Anemia of critical illness H&H is stable between 9-10 Monitor H&H and transfuse if less than 7  Severe protein calorie malnutrition Continue TPN until bowel function returns  Hypertension Continue as needed labetalol   Hyperglycemia No known history of diabetes Check A1c Started on Lantus  10 units twice daily  GOC - 8/22 spoke w pt granddaughter who is an ID physician at [employer]. Sounds like family is very realistic -- critical illness at 89 yo w major surgeries is a difficult journey but hope is to continue aggressive measures and get through  this. Sounds like they understand the possibility of further decompensation & appreciate very transparent communication.  -regarding his incidental mass / cf cancer-- hopefully if pt gets through this critical course he will be able to engage in decision making for this, but sounds like QOL is a high priority    Labs   CBC: Recent Labs  Lab 04/06/24 0420 04/06/24 1456 04/07/24 0238 04/07/24 2101 04/07/24 2109 04/08/24 0507 04/09/24 0421  WBC 17.9*  --  27.3* 17.6*  --  19.6* 21.0*  HGB 9.4*   < > 9.6* 9.1* 8.5* 9.8* 8.7*  HCT 28.5*   < > 29.2* 27.8* 25.0* 29.6* 26.9*  MCV 98.3  --  98.3 98.6  --  98.3 99.3  PLT 196  --  270 245  --  281 290   < > = values in this interval not displayed.    Basic Metabolic Panel: Recent Labs  Lab 04/06/24 0420 04/06/24 1610 04/07/24 0238 04/07/24 2101 04/07/24 2109 04/08/24 0507 04/09/24 0421  NA 131*   < > 136 134* 136 134* 137  K 3.4*   < > 4.2 4.2 4.3 4.4 4.4  CL 101  --  104 105  --  111 107  CO2 22  --  21* 21*  --  22 22  GLUCOSE 227*  --  179* 184*  --  178* 220*  BUN 15  --  13 16  --  20 28*  CREATININE 0.50*  --  0.47* 0.46*  --  0.44* 0.56*  CALCIUM  8.3*  --  8.3* 8.3*  --  8.3* 8.3*  MG 1.9  --  2.2 2.0  --  2.1 2.0  PHOS 2.1*  --  2.6 2.6  --  2.5 2.5   < > = values in this interval not displayed.   GFR: Estimated Creatinine Clearance: 64.3 mL/min (A) (by C-G formula based on SCr of 0.56 mg/dL (L)). Recent Labs  Lab 04/05/24 2128 04/06/24 0420 04/07/24 0238 04/07/24 2101 04/07/24 2109 04/08/24 0507 04/09/24 0421  WBC  --  17.9* 27.3* 17.6*  --  19.6* 21.0*  LATICACIDVEN 2.0* 1.4 1.3  --  0.9  --   --     Liver Function Tests: Recent Labs  Lab 04/03/24 2011 04/05/24 1710  AST 16 25  ALT 17 18  ALKPHOS 52 45  BILITOT 1.4* 2.2*  PROT 7.0 5.4*  ALBUMIN  3.2* 2.7*   Recent Labs  Lab 04/03/24 2011  LIPASE 29   No results for input(s): AMMONIA in the last 168 hours.  ABG    Component Value  Date/Time   PHART 7.384 04/07/2024 2109   PCO2ART 37.1 04/07/2024 2109   PO2ART 152 (H) 04/07/2024 2109   HCO3 22.2 04/07/2024 2109   TCO2 23 04/07/2024 2109   ACIDBASEDEF 3.0 (H) 04/07/2024 2109   O2SAT 99 04/07/2024 2109     Coagulation Profile: No results for input(s): INR, PROTIME in the last 168 hours.  Cardiac Enzymes: No results for input(s): CKTOTAL, CKMB, CKMBINDEX, TROPONINI in the last 168 hours.  HbA1C: No results found for: HGBA1C  CBG: Recent Labs  Lab 04/08/24 1927 04/08/24 2324 04/09/24 0335 04/09/24 0431 04/09/24 0815  GLUCAP 171* 183* 211* 222* 208*   The patient is critically ill due to probable ischemia status post ex lap and resection/acute peritonitis with sepsis/acute respiratory insufficiency, postprocedure requiring titration of ventilator.  Critical care was necessary to treat or prevent imminent or life-threatening deterioration.  Critical care was time spent personally by me on the following activities: development of treatment plan with patient and/or surrogate as well as nursing, discussions with consultants, evaluation of patient's response to treatment, examination of patient, obtaining history from patient or surrogate, ordering and performing treatments and interventions, ordering and review of laboratory studies, ordering and review of radiographic studies, pulse oximetry, re-evaluation of patient's condition and participation in multidisciplinary rounds.   During this encounter critical care time was devoted to patient care services described in this note for 37 minutes.     Valinda Novas, MD [facility] See Amion for pager If no response to pager, please call [phone] until 7pm After 7pm, Please call E-link [phone]

## 2024-04-09 NOTE — Progress Notes (Signed)
 PHARMACY NOTE:  ANTIMICROBIAL RENAL DOSAGE ADJUSTMENT  Current antimicrobial regimen includes a mismatch between antimicrobial dosage and estimated renal function.  As per policy approved by the Pharmacy & Therapeutics and Medical Executive Committees, the antimicrobial dosage will be adjusted accordingly.  Current antimicrobial dosage:  Cefepime  2 gm IV every 12 hours  Indication: acute peritonitis  Renal Function:  Estimated Creatinine Clearance: 64.3 mL/min (A) (by C-G formula based on SCr of 0.56 mg/dL (L)). []      On intermittent HD, scheduled: []      On CRRT    Antimicrobial dosage has been changed to:  Cefepime  2 gm IV every 8 hours  Additional comments:  Thank you for allowing pharmacy to be a part of this patient's care.  Morna Breach, PharmD PGY2 Cardiology Pharmacy Resident 04/09/2024 12:37 PM

## 2024-04-09 NOTE — Progress Notes (Signed)
 Progress Note  2 Days Post-Op  Subjective:  Remains intubated and sedated, although more arousable today. No reported bowel movements by nursing  Objective: Vital signs in last 24 hours: Temp:  [98 F (36.7 C)-99.7 F (37.6 C)] 98.5 F (36.9 C) (08/24 0400) Pulse Rate:  [68-117] 81 (08/24 0728) Resp:  [14-25] 14 (08/24 0728) BP: (123-153)/(50-66) 146/58 (08/24 0728) SpO2:  [100 %] 100 % (08/24 0728) Arterial Line BP: (117-172)/(51-76) 158/65 (08/24 0700) FiO2 (%):  [40 %] 40 % (08/24 0728) Weight:  [84.1 kg] 84.1 kg (08/24 0540) Last BM Date : 04/05/24  Intake/Output from previous day: 08/23 0701 - 08/24 0700 In: 2871.7 [I.V.:2371.7; IV Piggyback:500] Out: 810 [Urine:660; Emesis/NG output:150] Intake/Output this shift: No intake/output data recorded.  PE: Amio gtt  General: sedated on vent Heart: irregular, low 90s Lungs: on vent Abd: soft, not significantly distended; midline fascia intact/closed; wound healthy and granulating   Lab Results:  Recent Labs    04/08/24 0507 04/09/24 0421  WBC 19.6* 21.0*  HGB 9.8* 8.7*  HCT 29.6* 26.9*  PLT 281 290   BMET Recent Labs    04/08/24 0507 04/09/24 0421  NA 134* 137  K 4.4 4.4  CL 111 107  CO2 22 22  GLUCOSE 178* 220*  BUN 20 28*  CREATININE 0.44* 0.56*  CALCIUM  8.3* 8.3*   PT/INR No results for input(s): LABPROT, INR in the last 72 hours. CMP     Component Value Date/Time   NA 137 04/09/2024 0421   K 4.4 04/09/2024 0421   CL 107 04/09/2024 0421   CO2 22 04/09/2024 0421   GLUCOSE 220 (H) 04/09/2024 0421   BUN 28 (H) 04/09/2024 0421   CREATININE 0.56 (L) 04/09/2024 0421   CALCIUM  8.3 (L) 04/09/2024 0421   PROT 5.4 (L) 04/05/2024 1710   ALBUMIN  2.7 (L) 04/05/2024 1710   AST 25 04/05/2024 1710   ALT 18 04/05/2024 1710   ALKPHOS 45 04/05/2024 1710   BILITOT 2.2 (H) 04/05/2024 1710   GFRNONAA >60 04/09/2024 0421   GFRAA >60 03/06/2020 0204   Lipase     Component Value Date/Time   LIPASE  29 04/03/2024 2011       Studies/Results: No results found.   Anti-infectives: Anti-infectives (From admission, onward)    Start     Dose/Rate Route Frequency Ordered Stop   04/04/24 2200  ceFEPIme  (MAXIPIME ) 2 g in sodium chloride  0.9 % 100 mL IVPB        2 g 200 mL/hr over 30 Minutes Intravenous Every 12 hours 04/04/24 1332     04/04/24 1300  metroNIDAZOLE  (FLAGYL ) IVPB 500 mg        500 mg 100 mL/hr over 60 Minutes Intravenous Every 12 hours 04/04/24 0825     04/04/24 0900  ceFEPIme  (MAXIPIME ) 2 g in sodium chloride  0.9 % 100 mL IVPB  Status:  Discontinued        2 g 200 mL/hr over 30 Minutes Intravenous Every 8 hours 04/04/24 0842 04/04/24 1332   04/04/24 0030  ceFEPIme  (MAXIPIME ) 2 g in sodium chloride  0.9 % 100 mL IVPB        2 g 200 mL/hr over 30 Minutes Intravenous  Once 04/04/24 0025 04/04/24 0107   04/04/24 0030  metroNIDAZOLE  (FLAGYL ) IVPB 500 mg        500 mg 100 mL/hr over 60 Minutes Intravenous  Once 04/04/24 0025 04/04/24 0137        Assessment/Plan POD 4, s/p ex lap with  SBRx2 in discontinuity, open abdomen, Dr. Tanda 8/20 for ischemic bowel POD 3 endovascular thrombectomy of SMA w/ stenting (Dr. Magda) POD 2 SBR with re-anastomosis and abd closure   - Appreciate CCM assistance - Discussed with Dr. Harold - noted plans for potential extubation today  - Continue TPN; holding enteral nutrition until reliable return of bowel fxn    FEN: NPO, NG/OG (famotidine  for some blood-tinged output), PICC/TNA VTE: Eliquis  on hold, heparin  gtt ID: cefepime /flagyl    - per [facility] -  A. Fib on Eliquis  - LD 8/18 PM CAD s/p CABG HTN HLD CHF - last ECHO in 2021 with EF 65% Hx of perforated appendicitis treated with abx    LOS: 5 days   I reviewed hospitalist notes, last 24 h vitals and pain scores, last 48 h intake and output, last 24 h labs and trends, and last 24 h imaging results.  CRITICAL CARE Performed by: Lonni CHRISTELLA Pizza   Total critical care  time: 30 minutes  Critical care time was exclusive of separately billable procedures and treating other patients.  Critical care was necessary to treat or prevent imminent or life-threatening deterioration.  Critical care was time spent personally by me on the following activities: development of treatment plan with patient and/or surrogate as well as nursing, discussions with consultants, evaluation of patient's response to treatment, examination of patient, obtaining history from patient or surrogate, ordering and performing treatments and interventions, ordering and review of laboratory studies, ordering and review of radiographic studies, pulse oximetry and re-evaluation of patient's condition.  Lonni CHRISTELLA Pizza, MD  [facility] 04/09/2024, 8:09 AM Please see Amion for pager number for on call team member

## 2024-04-09 NOTE — Progress Notes (Signed)
 PHARMACY - TOTAL PARENTERAL NUTRITION CONSULT NOTE   Indication: ischemic bowel   Patient Measurements: Height: 5' 6.5 (168.9 cm) Weight: 84.1 kg (185 lb 6.5 oz) IBW/kg (Calculated) : 64.95 TPN AdjBW (KG): 75.2 Body mass index is 29.48 kg/m.  Assessment:  Patient is an 89 y.o M with hx afib who presented to the ED on 04/03/24 with c/o diarrhea and abdominal pain. Abd/pelvis CT on 04/03/24 showed findings with concern for ischemic enteritis and colitis. With significant TTP noted on exam on 04/05/24, plan is to proceed with exp lap with possible laparotomy and bowel resection.  Pharmacy has been consulted on 04/05/24 to dose TPN for poor nutritional status.  Off pressor support. Propofol  now discontinued 8/23 @ 0500. Will adjust TPN to add additional kcal and lipids.    Glucose / Insulin : no hx DM; mSSI q4h (19u), CBGs <180 Electrolytes: Na 137, K 4.4, Mag 2.0, Phos 2.5, CoCa 9.34, others WNL Renal: SCr < 1, BUN up 28  Hepatic: AST/ALT, Alk phos WNL.Tbili up at 2.2, Albumin  2.7, 8/21 Triglycerides 47 off propofol  (117 on propofol )  Intake / Output; MIVF: UOP 0.3 ml/kg/hr, NG 150 mL, +13.6L since admission  GI Imaging: 8/18 CT: suspicious for ischemic enteritis. Mild wall thickening and mucosal enhancement involving the hepatic flexure and proximal transverse colon, suspect for colitis of infectious, inflammatory or ischemic etiology. GI Surgeries / Procedures:  8/20 exploratory/diagnositic laparotmy with appendectomy and resection x2 of ischemic small bowel 8/21 mesenteric artery thrombectomy, angio, and stent  8/22 SBR, abd wall closure  Central access: 04/05/24 TPN start date: 04/05/24   Nutritional Goals: Goal TPN rate (with lipids) is 75 mL/hr (provides ~110 g of protein and ~1900 kcals per day)  RD Assessment: Estimated Needs Total Energy Estimated Needs: 1800-1900 kcals Total Protein Estimated Needs: 105-125 g Total Fluid Estimated Needs: >/= 1.8 L  Current Nutrition:  8/21  NPO + Propofol   50 mcg/kg/min, 587 KCal/day 8/23 propofol  discontinued, lipids added back to TPN   Plan:  Continue TPN at 75 mL/hr at 1800 which provides 100% of estimated daily caloric needs Electrolytes in TPN: Na 85 mEq/L,K to 35 mEq/L, Ca to 3 mEq/L, Mg 69mEq/L, and Phos 15mmol/L. Cl:Ac 1:2 adjusted for rate Add standard MVI and trace elements in TPN  Increase to resistant SSI q4h SSI and adjust as needed  Thiamine  x 5 days IV daily x5 days (8/20 2024-04-22)   Monitor TPN labs on Mon/Thurs  Thank you for allowing pharmacy to be a part of this patient's care.  Shelba Collier, PharmD, BCPS Clinical Pharmacist

## 2024-04-09 NOTE — Plan of Care (Signed)
  Problem: Clinical Measurements: Goal: Ability to maintain clinical measurements within normal limits will improve Outcome: Progressing   Problem: Clinical Measurements: Goal: Diagnostic test results will improve Outcome: Progressing   Problem: Clinical Measurements: Goal: Cardiovascular complication will be avoided Outcome: Progressing   Problem: Activity: Goal: Risk for activity intolerance will decrease Outcome: Progressing   Problem: Nutrition: Goal: Adequate nutrition will be maintained Outcome: Progressing   Problem: Coping: Goal: Level of anxiety will decrease Outcome: Progressing   Problem: Activity: Goal: Ability to tolerate increased activity will improve Outcome: Progressing   Problem: Role Relationship: Goal: Method of communication will improve Outcome: Progressing

## 2024-04-09 NOTE — Procedures (Signed)
 Extubation Procedure Note  Patient Details:   Name: Donald [name] DOB: 05-17-1933 MRN: [id number]   Airway Documentation:    Vent end date: 04/09/24 Vent end time: 1056   Evaluation  O2 sats: stable throughout Complications: No apparent complications Patient did tolerate procedure well. Bilateral Breath Sounds: Diminished, Clear   Yes  Positive cuff leak noted. Patient placed on [place] 4L, no stridor noted. Patient not currently following commands (to close his lips around the mouthpiece) to use the incentive spirometer. Patient does follow commands to cough.  Cy RAMAN Jazalynn Mireles 04/09/2024, 11:09 AM

## 2024-04-10 ENCOUNTER — Encounter (HOSPITAL_COMMUNITY): Payer: Self-pay | Admitting: General Surgery

## 2024-04-10 DIAGNOSIS — A419 Sepsis, unspecified organism: Secondary | ICD-10-CM | POA: Diagnosis not present

## 2024-04-10 DIAGNOSIS — K529 Noninfective gastroenteritis and colitis, unspecified: Secondary | ICD-10-CM | POA: Diagnosis not present

## 2024-04-10 LAB — POCT I-STAT 7, (LYTES, BLD GAS, ICA,H+H)
Acid-base deficit: 3 mmol/L — ABNORMAL HIGH (ref 0.0–2.0)
Bicarbonate: 22.3 mmol/L (ref 20.0–28.0)
Calcium, Ion: 1.27 mmol/L (ref 1.15–1.40)
HCT: 25 % — ABNORMAL LOW (ref 39.0–52.0)
Hemoglobin: 8.5 g/dL — ABNORMAL LOW (ref 13.0–17.0)
O2 Saturation: 94 %
Potassium: 4.2 mmol/L (ref 3.5–5.1)
Sodium: 137 mmol/L (ref 135–145)
TCO2: 23 mmol/L (ref 22–32)
pCO2 arterial: 38.2 mmHg (ref 32–48)
pH, Arterial: 7.374 (ref 7.35–7.45)
pO2, Arterial: 72 mmHg — ABNORMAL LOW (ref 83–108)

## 2024-04-10 LAB — SURGICAL PATHOLOGY

## 2024-04-10 MED ORDER — MIDAZOLAM BOLUS VIA INFUSION (WITHDRAWAL LIFE SUSTAINING TX): 2.0000 mg | INTRAVENOUS | Status: DC | PRN

## 2024-04-10 MED ORDER — MIDAZOLAM-SODIUM CHLORIDE 100-0.9 MG/100ML-% IV SOLN
0.0000 mg/h | INTRAVENOUS | Status: DC
  Administered 2024-04-10: 1 mg/h via INTRAVENOUS
  Filled 2024-04-10: qty 100

## 2024-04-10 MED ORDER — MIDAZOLAM HCL 2 MG/2ML IJ SOLN
1.0000 mg | INTRAMUSCULAR | Status: DC | PRN
  Filled 2024-04-10: qty 2

## 2024-04-11 LAB — CULTURE, RESPIRATORY W GRAM STAIN: Culture: NO GROWTH

## 2024-04-17 NOTE — Death Summary Note (Signed)
 DEATH SUMMARY   Patient Details  Name: Donald [name] MRN: [id number] DOB: [birth year]  Admission/Discharge Information   Admit Date:  04-08-2024  Date of Death: Date of Death: 2024/04/15  Time of Death: Time of Death: 1831-04-23  Length of Stay: 6  Referring Physician: [facility]   Reason(s) for Hospitalization  Mesenteric ischemia  Diagnoses  Preliminary cause of death:  Secondary Diagnoses (including complications and co-morbidities):  Principal Problem:   Colitis Active Problems:   Ischemic enteritis (HCC)   Ventilator dependence (HCC)   [place] Agitation Sedation Scale (RASS) score minus 4, deep sedation   Protein-calorie malnutrition, severe   Acute respiratory failure with hypoxia (HCC)  Bowel ischemia s/p ex lap small bowel resection x3 + appendectomy  Sepsis due to acute peritonitis SMA occlusion s/p thrombectomy, angioplasty and stent placement  Acute respiratory insufficiency, postprocedure Bilateral R>L pleural effusion  R lung mass, 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 Patient was admitted for further management and general surgery was consulted. Initially patient was  managed conservatively on the floor but giving ongoing abdominal pain, worsening leukocytosis and persistent tachycardia the decision was made to proceed with diagnostic ex-lap on 8/20. Ex-lap showed small bowel ischemia and he underwent small bowel resection x 2 and appendectomy. He had other areas suspicious for ischemia so he was left open in discontinuity with plan to return for a second look on Friday 8/22. He was brought to ICU for ventilator management.   He required stenting of his SMA by vascular surgery and was transferred to [facility] for ongoing vascular surgery care. He underwent repeat laparotomy with abdominal wall closure the following day.   He was able to be extubated in the ICU 2 days after surgery but within hours deteriorated due to weakness causing inability to mobilize secretions. Family elected to pursue comfort-focused care at this point due to his poor prognosis long term.  Pertinent Labs and Studies  Significant Diagnostic Studies ECHOCARDIOGRAM LIMITED Result Date: 04/06/2024    ECHOCARDIOGRAM LIMITED REPORT   Patient Name:   Donald [name] Date of Exam: 04/06/2024 Medical Rec #:  [id number]   Height:       66.5 in Accession #:    [id number]  Weight:       170.0 lb Date of Birth:  [birth year]   BSA:          1.877 m Patient Age:    89 years    BP:           100/56 mmHg Patient Gender: M           HR:           81 bpm. Exam Location:  Inpatient Procedure: Cardiac Doppler and Limited Color Doppler (Both Spectral and Color  Flow Doppler were utilized during procedure). Indications:    A fib  History:        Patient has no prior history of Echocardiogram examinations.                 Arrythmias:Atrial Fibrillation.  Sonographer:    Benard Stallion Referring Phys: [id number] TARA N WILSON IMPRESSIONS  1. Left ventricular ejection fraction, by estimation, is 40 to 45%. The left ventricle has mildly decreased function. The left ventricle demonstrates regional wall motion abnormalities (see  scoring diagram/findings for description). There is moderate concentric left ventricular hypertrophy. Left ventricular diastolic function could not be evaluated.  2. Right ventricular systolic function is mildly reduced. There is moderately elevated pulmonary artery systolic pressure. The estimated right ventricular systolic pressure is 45.9 mmHg.  3. Left atrial size was moderately dilated.  4. Right atrial size was mildly dilated.  5. The mitral valve is degenerative. Mild mitral valve regurgitation.  6. The aortic valve is tricuspid. There is moderate calcification of the aortic valve. Aortic valve sclerosis/calcification is present, without any evidence of aortic stenosis. FINDINGS  Left Ventricle: Left ventricular ejection fraction, by estimation, is 40 to 45%. The left ventricle has mildly decreased function. The left ventricle demonstrates regional wall motion abnormalities. There is moderate concentric left ventricular hypertrophy. Left ventricular diastolic function could not be evaluated. Left ventricular diastolic function could not be evaluated due to atrial fibrillation.  LV Wall Scoring: The mid and distal anterior septum and mid inferoseptal segment are akinetic. The basal anteroseptal segment is hypokinetic. Right Ventricle: Right ventricular systolic function is mildly reduced. There is moderately elevated pulmonary artery systolic pressure. The tricuspid regurgitant velocity is 3.08 m/s, and with an assumed right atrial pressure of 8 mmHg, the estimated right ventricular systolic pressure is 45.9 mmHg. Left Atrium: Left atrial size was moderately dilated. Right Atrium: Right atrial size was mildly dilated. Mitral Valve: The mitral valve is degenerative in appearance. Mild mitral annular calcification. Mild mitral valve regurgitation. Tricuspid Valve: Tricuspid valve regurgitation is mild. Aortic Valve: The aortic valve is tricuspid. There is moderate calcification of the aortic valve. Aortic valve  sclerosis/calcification is present, without any evidence of aortic stenosis. Aortic valve mean gradient measures 2.0 mmHg. Aortic valve peak gradient measures 4.2 mmHg. Aortic valve area, by VTI measures 2.24 cm. Pulmonic Valve: Pulmonic valve regurgitation is mild. LEFT VENTRICLE PLAX 2D LVIDd:         4.10 cm LVIDs:         3.10 cm LV PW:         1.10 cm LV IVS:        1.10 cm LVOT diam:     2.30 cm LV SV:         36 LV SV Index:   19 LVOT Area:     4.15 cm  RIGHT VENTRICLE TAPSE (M-mode): 1.6 cm LEFT ATRIUM         Index LA diam:    4.10 cm 2.18 cm/m  AORTIC VALVE AV Area (Vmax):    2.32 cm AV Area (Vmean):   2.32 cm AV Area (VTI):     2.24 cm AV Vmax:           102.00 cm/s AV Vmean:          69.200 cm/s AV VTI:            0.162 m AV Peak Grad:      4.2 mmHg AV Mean Grad:  2.0 mmHg LVOT Vmax:         57.00 cm/s LVOT Vmean:        38.600 cm/s LVOT VTI:          0.088 m LVOT/AV VTI ratio: 0.54  AORTA Ao Root diam: 3.40 cm MITRAL VALVE               TRICUSPID VALVE MV Area (PHT): 5.31 cm    TR Peak grad:   37.9 mmHg MV Decel Time: 143 msec    TR Vmax:        308.00 cm/s MV E velocity: 78.80 cm/s MV A velocity: 24.90 cm/s  SHUNTS MV E/A ratio:  3.16        Systemic VTI:  0.09 m                            Systemic Diam: 2.30 cm Toribio Fuel MD Electronically signed by Toribio Fuel MD Signature Date/Time: 04/06/2024/6:00:21 PM    Final    HYBRID OR IMAGING ([facility] ONLY) Result Date: 04/06/2024 There is no interpretation for this exam.  This order is for images obtained during a surgical procedure.  Please See Surgeries Tab for more information regarding the procedure.   CT Angio Abd/Pel w/ and/or w/o Result Date: 04/06/2024 CLINICAL DATA:  Abdominal pain and diarrhea with clinical and imaging findings suspicious for acute mesenteric ischemia suggested on an abdomen and pelvis CT dated 04/03/2024. Status post laparoscopy and laparotomy with bowel resection, appendectomy and application of a wound  VAC on 04/05/2024. EXAM: CTA ABDOMEN AND PELVIS WITHOUT AND WITH CONTRAST TECHNIQUE: Multidetector CT imaging of the abdomen and pelvis was performed using the standard protocol during bolus administration of intravenous contrast. Multiplanar reconstructed images and MIPs were obtained and reviewed to evaluate the vascular anatomy. RADIATION DOSE REDUCTION: This exam was performed according to the departmental dose-optimization program which includes automated exposure control, adjustment of the mA and/or kV according to patient size and/or use of iterative reconstruction technique. CONTRAST:  100mL OMNIPAQUE  IOHEXOL  350 MG/ML SOLN COMPARISON:  Abdomen and pelvis CT dated 04/03/2024. FINDINGS: VASCULAR Aorta: Extensive atheromatous calcifications without aneurysm or dissection. No significant luminal stenosis. Celiac: Dense atheromatous calcifications at the origin without significant stenosis. SMA: Marked calcified plaque at the origin with near occluding thrombus/noncalcified plaque in the proximal SMA. Additional thrombus in an estimated branch level extending into the right mid abdomen causing significant narrowing in the proximal aspect of this branch vessel and complete occlusion of the mid and distal portions. Renals: Dense calcifications at the origins of both renal arteries and in both proximal renal arteries with proximally 50% stenosis in the proximal left renal artery and approximately 90% stenosis in the proximal right renal artery. IMA: Calcified plaque in the proximal IMA causing approximately 30% stenosis. Inflow: Extensive calcified and some noncalcified plaque bilaterally with no areas of near occlusive stenosis. Proximal Outflow: Extensive calcified plaques bilaterally without near occlusive stenosis. Veins: No obvious venous abnormality within the limitations of this arterial phase study. Review of the MIP images confirms the above findings. NON-VASCULAR Lower chest: Enlarged heart. Small to  moderate-sized right pleural effusion and small left pleural effusion. Mild associated bibasilar atelectasis. Hepatobiliary: Sludge and 3 mm calculus in the gallbladder without wall thickening. Small amount of pericholecystic fluid commensurate with a small amount of ascites in the abdomen. Pancreas: Unremarkable. No pancreatic ductal dilatation or surrounding inflammatory changes. Spleen: Normal in size without focal abnormality. Adrenals/Urinary  Tract: 4.0 cm and 2.7 cm fat density masses in the left adrenal gland. Right adrenal hyperplasia as well as a 1.6 cm possible adenoma. Bilateral simple appearing renal cysts, not needing imaging follow-up. Unremarkable ureters. Foley catheter in the urinary bladder with no urine in the bladder. Stomach/Bowel: Interval small bowel resection with a right lower quadrant anastomosis as well as midline and the left of midline at anastomosis in the lower abdomen. Mildly dilated loops of jejunum in the left mid abdomen without wall thickening or pneumatosis. No bowel wall thickening or pneumatosis elsewhere in the abdomen. Multiple sigmoid colon diverticula without evidence of diverticulitis and proximal transverse colon diverticulum without evidence of diverticulitis. Surgically absent appendix. Normal-appearing stomach with a nasogastric tube in place. Lymphatic: No enlarged lymph nodes. Reproductive: Mildly to moderately enlarged prostate gland. Other: Multiple areas of postoperative free peritoneal fluid and air. Small umbilical hernia containing fat. Musculoskeletal: Marked lumbar and lower thoracic spine degenerative changes. IMPRESSION: 1. Near occluding thrombus/noncalcified plaque in the proximal SMA with additional thrombus in an SMA branch level extending into the right mid abdomen causing significant narrowing in the proximal aspect of this branch vessel and complete occlusion of the mid and distal portions. 2. Mildly dilated loops of jejunum in the left mid abdomen  without wall thickening or pneumatosis. This is most likely due to ileus. 3. Interval small bowel resection with a right lower quadrant anastomosis as well as midline and left of midline anastomoses in the lower abdomen. 4. Multiple areas of postoperative free peritoneal fluid and air. 5. Small to moderate-sized right pleural effusion and small left pleural effusion with mild associated bibasilar atelectasis. 6. Extensive calcified plaques involving the aorta and its branches, as described above. 7. Cholelithiasis and sludge in the gallbladder without evidence of cholecystitis. 8. 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 9. 1.6 cm probable right adrenal adenoma. 10. Colonic diverticulosis without evidence of diverticulitis. 11. Mildly to moderately enlarged prostate gland. 12. Small umbilical hernia containing fat. Critical Value/emergent results were called by telephone at the time of interpretation on 04/06/2024 at 11:29 am to provider Vina Pesa, NP, who verbally acknowledged these results. Aortic Atherosclerosis (ICD10-I70.0). Electronically Signed   By: Elspeth Bathe M.D.   On: 04/06/2024 11:39   CT CHEST WO CONTRAST Result Date: 04/06/2024 CLINICAL DATA:  Right middle lobe pneumonia EXAM: CT CHEST WITHOUT CONTRAST TECHNIQUE: Multidetector CT imaging of the chest was performed following the standard protocol without IV contrast. RADIATION DOSE REDUCTION: This exam was performed according to the departmental dose-optimization program which includes automated exposure control, adjustment of the mA and/or kV according to patient size and/or use of iterative reconstruction technique. COMPARISON:  Multiple recent radiographs. FINDINGS: Cardiovascular: The study was performed without contrast. Heart is mildly enlarged. There is extensive coronary artery calcification. Previous CABG. Extensive aortic atherosclerotic calcification. Mediastinum/Nodes: Right paratracheal and Peri carinal lymphadenopathy  consistent with metastatic disease. Right-sided Peri carinal lymph node measures 3.7 x 2.9 cm. Lungs/Pleura: Left chest shows a small effusion layering dependently with mild dependent atelectasis and mild emphysematous change in the upper lung. Right chest shows a moderate effusion layering dependently with dependent atelectasis. Pulmonary infiltrate is present within the right upper lobe consistent with bronchopneumonia. There is a right upper lobe mass measuring approximately 5.7 x 4.1 x 6.0 cm consistent with primary lung carcinoma. This is metastatic to the hilar, Peri carinal and right paratracheal nodes as noted above. Upper Abdomen: No acute upper abdominal finding. Fat-density adrenal masses consistent with adenomas.  Musculoskeletal: Ordinary thoracic degenerative changes. IMPRESSION: 1. 5.7 x 4.1 x 6.0 cm right upper lobe mass consistent with primary lung carcinoma. This is metastatic to the right hilar, pericarinal and right paratracheal nodes. 2. Right upper lobe pulmonary infiltrate consistent with bronchopneumonia. 3. Moderate right effusion layering dependently with dependent atelectasis. Small left effusion layering dependently with mild dependent atelectasis. 4. Aortic atherosclerosis. Coronary artery calcification. Previous CABG. 5. Fat-density adrenal masses consistent with adenomas. Aortic Atherosclerosis (ICD10-I70.0). Electronically Signed   By: Oneil Officer M.D.   On: 04/06/2024 11:05   DG CHEST PORT 1 VIEW Result Date: 04/06/2024 CLINICAL DATA:  Follow-up ventilator support EXAM: PORTABLE CHEST 1 VIEW COMPARISON:  04/05/2024 FINDINGS: Endotracheal tube tip 4 cm above the carina. Orogastric or nasogastric tube enters the abdomen. Right arm PICC remains in place with its tip at the SVC RA junction. Slight worsening of the pattern of pulmonary opacity worse on the right than the left. Findings could relate to infectious pneumonia. Focal density in the right mid lung could possibly represent a  mass. Question small pleural effusion on the right. IMPRESSION: Slight worsening of the pattern of pulmonary opacity worse on the right than the left. Findings could relate to infectious pneumonia. Focal density in the right mid lung could possibly represent a mass. Question small right effusion. Electronically Signed   By: Oneil Officer M.D.   On: 04/06/2024 07:55   DG Chest Port 1 View Result Date: 04/05/2024 CLINICAL DATA:  PICC line placement EXAM: PORTABLE CHEST 1 VIEW COMPARISON:  None Available. FINDINGS: Endotracheal tube and NG tube remain in place, unchanged. Right PICC line in place with the tip at the cavoatrial junction. Prior median sternotomy. Heart mediastinal contours within normal limits. Consolidation in the right upper lobe, unchanged. Mild vascular congestion. No visible effusions or acute bony abnormality. IMPRESSION: Right PICC line tip at the cavoatrial junction. Stable right upper lobe consolidation. Mild vascular congestion. Electronically Signed   By: Franky Crease M.D.   On: 04/05/2024 19:14   DG CHEST PORT 1 VIEW Result Date: 04/05/2024 CLINICAL DATA:  On mechanically assisted ventilation EXAM: PORTABLE CHEST 1 VIEW COMPARISON:  None Available. FINDINGS: There is increased opacity of right mid-upper hemithorax likely representing an airspace consolidation/atelectasis. There is diffuse interstitial marking of central lungs. No significant pleural effusion. Cardiomediastinal silhouette is mildly enlarged. Status post median sternotomy. Enteric tube terminates in gastric fundus. Endotracheal tube terminates the 1.9 cm above carina. Degenerative changes of bilateral shoulder joints. IMPRESSION: Mid to upper right hemithorax opacity likely representing consolidation/atelectasis. Electronically Signed   By: Megan  Zare M.D.   On: 04/05/2024 17:50   US  EKG SITE RITE Result Date: 04/05/2024 If [facility] image not attached, placement could not be confirmed due to current cardiac  rhythm.  CT ABDOMEN PELVIS W CONTRAST Result Date: 04/03/2024 CLINICAL DATA:  Abdomen pain diarrhea EXAM: CT ABDOMEN AND PELVIS WITH CONTRAST TECHNIQUE: Multidetector CT imaging of the abdomen and pelvis was performed using the standard protocol following bolus administration of intravenous contrast. RADIATION DOSE REDUCTION: This exam was performed according to the departmental dose-optimization program which includes automated exposure control, adjustment of the mA and/or kV according to patient size and/or use of iterative reconstruction technique. CONTRAST:  OMNIPAQUE  IOHEXOL  300 MG/ML  SOLN COMPARISON:  CT 07/15/2020 FINDINGS: Lower chest: Lung bases demonstrate no acute airspace disease. Partially cystic lesion at right base with ground-glass nodularity at the periphery, series 5, image 6 and 7, measures about 16 x 10 mm on  series 5, image 7, previously 13 x 11 mm. Hepatobiliary: Slightly lobulated liver morphology. Subcentimeter hypodensities in the left and right hepatic lobe too small to further characterize but stable and presumed benign. Slight focal hyperdensity inferior right hepatic lobe series 2, image 27, without masslike features and possibly an area of altered perfusion. Overall heterogeneous liver parenchymal enhancement with vague areas of hyperenhancement. No biliary dilatation Pancreas: Unremarkable. No pancreatic ductal dilatation or surrounding inflammatory changes. Spleen: Normal in size without focal abnormality. Adrenals/Urinary Tract: Right adrenal gland demonstrates interval 18 mm nodule with density value of 104. Stable macroscopic fat containing left adrenal masses measuring 2.4 cm and 3.6 cm on series 2, image 18, consistent with myelolipomas. The kidneys show no hydronephrosis. Renal cysts and subcentimeter hypodensities too small to further characterize, no imaging follow-up is recommended. Urinary bladder is unremarkable Stomach/Bowel: The stomach is within normal limits.  Mostly decompressed small bowel. Isolated short segment of abnormal appearing small bowel in the anterior pelvis, this segment of small bowel measures approximately 4.5 cm on coronal series 7, image 36, it exhibits wall thickening and hypoenhancement of the wall, series 2, image 55. There is associated mesenteric congestion and stranding. No perforation. No pneumatosis. Scattered fluid within the colon. Suspicion of mild wall thickening involving the hepatic flexure and proximal transverse colon with mild mucosal enhancement. Diverticular disease of left colon. Favor collapsed appearance of the rectosigmoid colon over colitis. Mostly normal appearing appendix, but with cystic dilatation of the tip, coronal series 7 image 46, this could be due to sequela from prior perforated appendix. Difficult to exclude some fat stranding in the vicinity, series 7, image 48. Vascular/Lymphatic: Advanced aortic atherosclerosis. 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 No aneurysm. No suspicious lymph nodes Reproductive: Prostate slightly enlarged. Other: Negative for pelvic effusion or free air. Small fat containing umbilical hernia. Musculoskeletal: Scoliosis and advanced multilevel degenerative changes. No acute osseous abnormality IMPRESSION: 1. Isolated short segment of abnormal appearing small bowel in the anterior pelvis with wall thickening and hypoenhancement of the wall, associated mesenteric congestion and stranding. Findings are suspicious for ischemic enteritis. No perforation or pneumatosis. 2. Mild wall thickening and mucosal enhancement involving the hepatic flexure and proximal transverse colon, suspect for colitis of infectious, inflammatory or ischemic etiology. Diverticular disease of left colon with collapsed appearance versus mild colitis type wall thickening. 3. Mostly normal appearing appendix, but with cystic dilatation of the tip, this could be due  to sequela from prior perforated appendix. Difficult to exclude some fat stranding in the vicinity. Correlate for any signs or symptoms of acute appendicitis. 4. Interval 18 mm right adrenal nodule, indeterminate. Recommend nonemergent adrenal protocol CT or MRI. Stable left adrenal myelolipomas. 5. Partially cystic lesion at the right lung base with ground-glass nodularity at the periphery, slightly increased in size compared to prior. Suggest CT follow-up in 6 months for continued monitoring. 6. Heterogeneous liver parenchymal enhancement with vague areas of hyperenhancement When the patient is clinically stable and able to follow directions and hold their breath (preferably as an outpatient) further evaluation with dedicated abdominal MRI should be considered. 7. Aortic atherosclerosis. 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 Critical Value/emergent results were called by telephone at the time of interpretation on 04/03/2024 at 11:53 pm to provider [facility] , who verbally acknowledged these results. Aortic Atherosclerosis (ICD10-I70.0). Electronically Signed   By: Luke Scott HERO.D.  On: 04/03/2024 23:53    Microbiology Recent Results (from the past 240 hours)  MRSA Next Gen by PCR, Nasal     Status: None   Collection Time: 04/04/24  5:32 PM   Specimen: Nasal Mucosa; Nasal Swab  Result Value Ref Range Status   MRSA by PCR Next Gen NOT DETECTED NOT DETECTED Final    Comment: (NOTE) The GeneXpert MRSA Assay (FDA approved for NASAL specimens only), is one component of a comprehensive MRSA colonization surveillance program. It is not intended to diagnose MRSA infection nor to guide or monitor treatment for MRSA infections. Test performance is not FDA approved in patients less than 80 years old. Performed at [facility], [address], [place], [place] [address]   Gastrointestinal Panel by PCR , Stool     Status:  None   Collection Time: 04/04/24  6:37 PM   Specimen: Stool  Result Value Ref Range Status   Campylobacter species NOT DETECTED NOT DETECTED Final   Plesimonas shigelloides NOT DETECTED NOT DETECTED Final   Salmonella species NOT DETECTED NOT DETECTED Final   Yersinia enterocolitica NOT DETECTED NOT DETECTED Final   Vibrio species NOT DETECTED NOT DETECTED Final   Vibrio cholerae NOT DETECTED NOT DETECTED Final   Enteroaggregative E coli (EAEC) NOT DETECTED NOT DETECTED Final   Enteropathogenic E coli (EPEC) NOT DETECTED NOT DETECTED Final   Enterotoxigenic E coli (ETEC) NOT DETECTED NOT DETECTED Final   Shiga like toxin producing E coli (STEC) NOT DETECTED NOT DETECTED Final   Shigella/Enteroinvasive E coli (EIEC) NOT DETECTED NOT DETECTED Final   Cryptosporidium NOT DETECTED NOT DETECTED Final   Cyclospora cayetanensis NOT DETECTED NOT DETECTED Final   Entamoeba histolytica NOT DETECTED NOT DETECTED Final   Giardia lamblia NOT DETECTED NOT DETECTED Final   Adenovirus F40/41 NOT DETECTED NOT DETECTED Final   Astrovirus NOT DETECTED NOT DETECTED Final   Norovirus GI/GII NOT DETECTED NOT DETECTED Final   Rotavirus A NOT DETECTED NOT DETECTED Final   Sapovirus (I, II, IV, and V) NOT DETECTED NOT DETECTED Final    Comment: Performed at [facility], [address] [place], [place] [address]  Culture, Respiratory w Gram Stain     Status: None   Collection Time: 04/08/24  6:35 AM   Specimen: Tracheal Aspirate; Respiratory  Result Value Ref Range Status   Specimen Description TRACHEAL ASPIRATE  Final   Special Requests NONE  Final   Gram Stain   Final    RARE WBC PRESENT, PREDOMINANTLY PMN NO ORGANISMS SEEN Performed at [facility], [address] [place], [place] [address]    Culture RARE CANDIDA ALBICANS  Final   Report Status 04/11/2024 FINAL  Final    Lab Basic Metabolic Panel: Recent Labs  Lab 04/06/24 0420 04/06/24 1610 04/07/24 0238 04/07/24 2101  04/07/24 2109 04/08/24 0507 04/09/24 0421 04/09/24 1340  NA 131*   < > 136 134* 136 134* 137 137  K 3.4*   < > 4.2 4.2 4.3 4.4 4.4 4.2  CL 101  --  104 105  --  111 107  --   CO2 22  --  21* 21*  --  22 22  --   GLUCOSE 227*  --  179* 184*  --  178* 220*  --   BUN 15  --  13 16  --  20 28*  --   CREATININE 0.50*  --  0.47* 0.46*  --  0.44* 0.56*  --  CALCIUM  8.3*  --  8.3* 8.3*  --  8.3* 8.3*  --   MG 1.9  --  2.2 2.0  --  2.1 2.0  --   PHOS 2.1*  --  2.6 2.6  --  2.5 2.5  --    < > = values in this interval not displayed.   Liver Function Tests: Recent Labs  Lab 04/05/24 1710  AST 25  ALT 18  ALKPHOS 45  BILITOT 2.2*  PROT 5.4*  ALBUMIN  2.7*   No results for input(s): LIPASE, AMYLASE in the last 168 hours. No results for input(s): AMMONIA in the last 168 hours. CBC: Recent Labs  Lab 04/06/24 0420 04/06/24 1456 04/07/24 0238 04/07/24 2101 04/07/24 2109 04/08/24 0507 04/09/24 0421 04/09/24 1340  WBC 17.9*  --  27.3* 17.6*  --  19.6* 21.0*  --   HGB 9.4*   < > 9.6* 9.1* 8.5* 9.8* 8.7* 8.5*  HCT 28.5*   < > 29.2* 27.8* 25.0* 29.6* 26.9* 25.0*  MCV 98.3  --  98.3 98.6  --  98.3 99.3  --   PLT 196  --  270 245  --  281 290  --    < > = values in this interval not displayed.   Cardiac Enzymes: No results for input(s): CKTOTAL, CKMB, CKMBINDEX, TROPONINI in the last 168 hours. Sepsis Labs: Recent Labs  Lab 04/05/24 2128 04/06/24 0420 04/07/24 0238 04/07/24 2101 04/07/24 2109 04/08/24 0507 04/09/24 0421  WBC  --  17.9* 27.3* 17.6*  --  19.6* 21.0*  LATICACIDVEN 2.0* 1.4 1.3  --  0.9  --   --     Procedures/Operations  Diagnostic ex-lap with appendectomy and partial small bowel resection Aortogram, insertion of SMA stent, thrombectomy SMA Ex-lap with partial small bowel resection and abdominal wall closure   Leita SHAUNNA Gaskins 04/12/2024, 1:30 PM

## 2024-04-17 NOTE — Progress Notes (Signed)
 NAME:  Donald [name], MRN:  [id number], DOB:  05-07-1933, LOS: 6 ADMISSION DATE:  04/03/2024, CONSULTATION DATE:  04/05/2024 REFERRING MD:  Dr. Camellia Blush, CHIEF COMPLAINT:  ischemic small bowel   History of Present Illness:  Donald [name] is a 89 year old male with PMH significant for Afib on Eliquis , CAD s/p CABGx3 (1998), HTN and HLD who presented on 04/03/2024 with 1 week of diarrhea and right lower quadrant abdominal pain. 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 Patient was admitted for further management and general surgery was consulted. Initially patient was managed conservatively on the floor but giving ongoing abdominal pain, worsening leukocytosis and persistent tachycardia the decision was made to proceed with diagnostic ex-lap on 8/20. Ex-lap showed small bowel ischemia and he underwent small bowel resection x 2 and appendectomy. He had other areas suspicious for ischemia so he was left open in discontinuity with plan to return for a second look on Friday 8/22. He was brought to ICU for ventilator management.   Pertinent  Medical History  Afib on Eliquis   CAD s/p CABGx3 (1998)  HTN HLD  Significant Hospital Events: Including procedures, antibiotic start and stop dates in addition to other pertinent events   04/03/2024: admitted for ischemic versus infectious enteritis 04/05/2024: OR for diagnostic ex-lap with ischemic small bowel s/p small bowel resection x 2, other concerning areas for ischemia, left open and in discontinuity. Transferred to ICU on ventilator. 8/21 OR w [facility] for SMA thrombectomy and angioplasty 8/22 return OR w [facility] underwent small bowel resection and abdominal wall closure 8/23 off sedation, tolerating spontaneous breathing trial, still sleepy, not ready for extubation, afebrile 04-24-24- Decision to transition to  comfort care   Interim History / Subjective:  On comfort measures  Breathing 4-6/min  On morphine  gtt, comfortable   Objective    Blood pressure (!) 142/51, pulse (!) 106, temperature 98.7 F (37.1 C), temperature source Axillary, resp. rate 12, height 5' 6.5 (1.689 m), weight 84.1 kg, SpO2 (!) 79%.        Intake/Output Summary (Last 24 hours) at 04/24/24 1246 Last data filed at 04/24/24 1000 Gross per 24 hour  Intake 1060.16 ml  Output 535 ml  Net 525.16 ml   Filed Weights   04/06/24 0433 04/08/24 0615 04/09/24 0540  Weight: 77.1 kg 79.8 kg 84.1 kg   Examination: General: acute on chronic elderly male, actively dying, lying in ICU bed HEENT: Normocephalic, Pink MM CV: s1,s2, RRR, no MRG, No JVD  pulm: clear, diminished, no distress, 4-6 breaths/min Abs: soft, hypoactive Extremities: edematous generalized  Skin: no rash  Neuro: Rass -4, comfort  GU: foley      Resolved problem list  Sedation related hypotension Hematuria due to traumatic Foley insertion  Assessment and Plan  GOC - 8/22 spoke w pt granddaughter who is an ID physician at [employer]. Sounds like family is very realistic -- critical illness at 89 yo w major surgeries is a difficult journey but hope is to continue aggressive measures and get through this. Sounds like they understand the possibility of further decompensation & appreciate very transparent communication.  -regarding his incidental mass / cf cancer-- hopefully if pt gets through this critical course he will be able to engage in decision making for this, but sounds like QOL is a high priority  See IPAL note on 8/24 - Patient transitioned to  comfort  P: Continue comfort measures- morphine  gtt ongoing, patient appears comfortable, in no distress   Bowel ischemia s/p ex lap small bowel resection x3 + appendectomy  Sepsis due to acute peritonitis SMA occlusion s/p thrombectomy, angioplasty and stent placement  Acute respiratory insufficiency,  postprocedure Bilateral R>L pleural effusion  R lung mass, 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  From 7A-7P if no response, please call [phone]. After hours, please call ELink [phone].

## 2024-04-17 NOTE — Progress Notes (Signed)
 Nutrition Brief Note  Chart reviewed. Pt now transitioning to comfort care.  No further nutrition interventions planned at this time.  Please re-consult as needed.   Romelle Starcher MS, RDN, LDN, CNSC Registered Dietitian 3 Clinical Nutrition RD Inpatient Contact Info in Amion

## 2024-04-17 DEATH — deceased

## 2024-05-16 ENCOUNTER — Encounter (HOSPITAL_COMMUNITY)

## 2024-05-16 ENCOUNTER — Encounter: Admitting: Vascular Surgery
